# Patient Record
Sex: Female | Born: 1950
Health system: Southern US, Community
[De-identification: ages and names within clinical notes are randomized; demographics above are authoritative.]

## PROBLEM LIST (undated history)

## (undated) DIAGNOSIS — J449 Chronic obstructive pulmonary disease, unspecified: Secondary | ICD-10-CM

## (undated) DIAGNOSIS — H269 Unspecified cataract: Secondary | ICD-10-CM

## (undated) DIAGNOSIS — E785 Hyperlipidemia, unspecified: Secondary | ICD-10-CM

## (undated) DIAGNOSIS — J439 Emphysema, unspecified: Secondary | ICD-10-CM

## (undated) DIAGNOSIS — M1990 Unspecified osteoarthritis, unspecified site: Secondary | ICD-10-CM

## (undated) HISTORY — PX: ABDOMINAL HYSTERECTOMY: SHX81

## (undated) HISTORY — DX: Unspecified osteoarthritis, unspecified site: M19.90

## (undated) HISTORY — DX: Unspecified cataract: H26.9

## (undated) HISTORY — DX: Emphysema, unspecified: J43.9

## (undated) HISTORY — PX: TUBAL LIGATION: SHX77

## (undated) HISTORY — DX: Hyperlipidemia, unspecified: E78.5

---

## 2006-03-18 ENCOUNTER — Ambulatory Visit: Payer: Self-pay | Admitting: Internal Medicine

## 2012-09-23 ENCOUNTER — Inpatient Hospital Stay: Payer: Self-pay | Admitting: Psychiatry

## 2012-09-23 LAB — CBC
MCH: 33.1 pg (ref 26.0–34.0)
MCHC: 32.3 g/dL (ref 32.0–36.0)
MCV: 103 fL — ABNORMAL HIGH (ref 80–100)
Platelet: 239 10*3/uL (ref 150–440)
RDW: 13.2 % (ref 11.5–14.5)
WBC: 7.5 10*3/uL (ref 3.6–11.0)

## 2012-09-23 LAB — COMPREHENSIVE METABOLIC PANEL
Albumin: 3.5 g/dL (ref 3.4–5.0)
Anion Gap: 14 (ref 7–16)
Chloride: 107 mmol/L (ref 98–107)
Co2: 21 mmol/L (ref 21–32)
Creatinine: 0.86 mg/dL (ref 0.60–1.30)
EGFR (African American): >60
Glucose: 132 mg/dL — ABNORMAL HIGH (ref 65–99)
Osmolality: 283 (ref 275–301)
Potassium: 3.7 mmol/L (ref 3.5–5.1)
SGPT (ALT): 23 U/L (ref 12–78)
Sodium: 142 mmol/L (ref 136–145)

## 2012-09-23 LAB — DRUG SCREEN, URINE
Amphetamines, Ur Screen: NEGATIVE (ref ?–1000)
Cannabinoid 50 Ng, Ur ~~LOC~~: NEGATIVE (ref ?–50)
Cocaine Metabolite,Ur ~~LOC~~: NEGATIVE (ref ?–300)
MDMA (Ecstasy)Ur Screen: NEGATIVE (ref ?–500)
Methadone, Ur Screen: NEGATIVE (ref ?–300)
Opiate, Ur Screen: POSITIVE (ref ?–300)
Phencyclidine (PCP) Ur S: NEGATIVE (ref ?–25)

## 2012-09-23 LAB — URINALYSIS, COMPLETE
Bacteria: NONE SEEN
Bilirubin,UR: NEGATIVE
Glucose,UR: NEGATIVE mg/dL (ref 0–75)
Leukocyte Esterase: NEGATIVE
Nitrite: NEGATIVE
Ph: 6 (ref 4.5–8.0)
RBC,UR: <1 /HPF (ref 0–5)
Squamous Epithelial: <1

## 2012-09-23 LAB — ACETAMINOPHEN LEVEL
Acetaminophen: 50 ug/mL — ABNORMAL HIGH
Acetaminophen: 23 ug/mL

## 2012-09-23 LAB — TSH: Thyroid Stimulating Horm: 13.4 u[IU]/mL — ABNORMAL HIGH

## 2012-09-23 LAB — ETHANOL: Ethanol: 208 mg/dL

## 2012-09-23 LAB — SALICYLATE LEVEL: Salicylates, Serum: 2.7 mg/dL

## 2012-09-24 LAB — BEHAVIORAL MEDICINE 1 PANEL
Albumin: 3.5 g/dL (ref 3.4–5.0)
BUN: 6 mg/dL — ABNORMAL LOW (ref 7–18)
Bilirubin,Total: 0.6 mg/dL (ref 0.2–1.0)
Chloride: 107 mmol/L (ref 98–107)
Creatinine: 0.78 mg/dL (ref 0.60–1.30)
EGFR (African American): >60
Eosinophil #: 0.2 10*3/uL (ref 0.0–0.7)
Eosinophil %: 2.6 %
Glucose: 87 mg/dL (ref 65–99)
MCH: 34.1 pg — ABNORMAL HIGH (ref 26.0–34.0)
MCV: 102 fL — ABNORMAL HIGH (ref 80–100)
Monocyte #: 0.4 x10 3/mm (ref 0.2–0.9)
Neutrophil #: 3.1 10*3/uL (ref 1.4–6.5)
Neutrophil %: 51.5 %
Osmolality: 280 (ref 275–301)
Platelet: 243 10*3/uL (ref 150–440)
Potassium: 4 mmol/L (ref 3.5–5.1)
RBC: 4.14 10*6/uL (ref 3.80–5.20)
RDW: 13.4 % (ref 11.5–14.5)
SGOT(AST): 23 U/L (ref 15–37)
SGPT (ALT): 22 U/L (ref 12–78)
Sodium: 142 mmol/L (ref 136–145)
Total Protein: 6.4 g/dL (ref 6.4–8.2)
WBC: 5.9 10*3/uL (ref 3.6–11.0)

## 2012-09-24 LAB — URINALYSIS, COMPLETE
Glucose,UR: NEGATIVE mg/dL (ref 0–75)
Leukocyte Esterase: NEGATIVE
Nitrite: NEGATIVE
Ph: 5 (ref 4.5–8.0)
Protein: NEGATIVE
RBC,UR: 1 /HPF (ref 0–5)
WBC UR: 2 /HPF (ref 0–5)

## 2013-12-21 ENCOUNTER — Ambulatory Visit: Payer: Self-pay | Admitting: Internal Medicine

## 2014-09-29 ENCOUNTER — Emergency Department: Payer: Self-pay | Admitting: Emergency Medicine

## 2015-01-11 ENCOUNTER — Ambulatory Visit
Admit: 2015-01-11 | Disposition: A | Discharge status: Home / Self Care | Payer: Self-pay | Attending: Internal Medicine | Admitting: Internal Medicine

## 2015-01-17 NOTE — H&P (Signed)
DATE OF BIRTH:  November 04, 1950 SEX:  Female. RACE:  White. AGE:  64 years.  INITIAL ASSESSMENT AND PSYCHIATRIC EVALUATION  IDENTIFYING INFORMATION:  The patient is a 64 year old white female not employed, in fact retired after working for [employer] for many years. The patient is widowed for several years and lives in a trailer where her sister, who is 69 years old, and daughter, who is 20 years old, live with her. All three of them live in a trailer. The patient comes for first inpatient hospitalization in psychiatry at [facility] behavioral health with a chief complaint of: "On Christmas Eve, I drank a little bit too much, and I did not know what was going on in my mind. I took some hydrocodone pills, which I got them when I got my dental work done in January 06, 2010."   HISTORY OF PRESENT ILLNESS:  When the patient was asked when she last felt well, she reported recently that she has been overwhelmed, and she has to be a caregiver for many people. In fact, she had to cook Christmas dinner all by herself and had to cook even for her mother-in-law. She started thinking of her husband, who died in Jan 07, 1995, and had arguments with her daughter, and her feelings got hurt and, she did not know what to do and so she took some extra pills of hydrocodone, which she saved the bottle, and in addition to alcohol and told her daughter, who called the ambulance and brought her here.   PAST PSYCHIATRIC HISTORY:  No previous history of inpatient hospitalization on psychiatry. No history of suicide attempts. Not being followed by any psychiatrist at this time.  FAMILY HISTORY OF MENTAL ILLNESS:  No known mental illness. No known suicides in the family.  FAMILY HISTORY:  Raised by parents. Father worked for a Personal [employer]. Father is still living, 32 years old. Mother worked in a [employer]. Mother died of Alzheimer's disease in her 31s. Has 2 sisters. No brothers. Close to family. In fact, calls herself a caregiver for  everybody.  PERSONAL HISTORY:  Born in [place], [place]. Graduate of high school. No college.  WORK HISTORY:  Longest job she has ever had was working for [employer]. Has not worked in many years, and in fact she quit when her second husband wanted her to stay home and take care of the childrenand  take care of everything else.   MARRIAGES:  Married twice. First marriage ended because he would not work and he was lazy. Has 2 children from first marriage, 40 and 27. She is in touch with them. Second marriage lasted until he died, and has 2 children, 51 and 59 years old, and is in touch with them.  ALCOHOL AND DRUGS:  First drink of alcohol about 20 years. No problems with alcohol drinking. No history of DWIs. Never arrested for public drunkenness. Was upset on Christmas Eve, and so she took a little bit too much alcohol, realizes the same. Denies street drug abuse. Denies using IV drugs. Smokes cigarettes at the rate of less than 1 pack a day for many year.  MILITARY HISTORY:  None.  MEDICAL HISTORY:  No known history of high blood pressure. No known diabetes of mellitus. No major surgeries. No major injuries. No history of motor vehicle accident. Never been unconscious. No known drug allergies. Not being followed by any physician at this time because she has no insurance.  PHYSICAL EXAMINATION: VITAL SIGNS:  Temperature is 98.2, pulse is 86 per  minute and regular, respirations 20 per minute and regular, blood pressure is 128/70 mmHg. HEENT:  Head is normocephalic, atraumatic. Eyes PERRLA. Fundi bilaterally benign. EOMs visualized. Tympanic membranes visualized, no exudates.  NECK:  Supple without any organomegaly, lymphadenopathy, thyromegaly.  CHEST:  Normal expansion. Normal breath sounds.  HEART:  Normal S1, S2 without any murmurs or gallops.  ABDOMEN:  Soft. No organomegaly. Bowel sounds heard.  RECTAL:  Deferred. NEUROLOGICAL:  Gait is normal. Romberg is negative. Cranial  nerves II through XII are grossly intact. DTRs 2+. Plantars are normal response.   MENTAL STATUS EXAMINATION:  The patient is dressed in personal clothes, alert and oriented to place, person and time. Fully aware of situation that brought her in for admission to [facility]. Affect is appropriate. Mood is stable, and admits that she is not depressed right now. Does feel stress out because she has been caregiver and having to do everything for everybody around and starting laughing while talking about it. No evidence of psychosis. Denies auditory or visual hallucinations. Denies hearing voices, seeing things. Memory is intact. Cognition is intact.  General knowledge and information is fair. Recall and memory are good. Could do serial 7s. Could spell the word "world" forward and backward without any problems. Appetite is a little slow because food is not that great here. Sleep is okay because of meds.. Insight and judgment fair.   IMPRESSION: AXIS I: 1.  Impulse control disorder. 2.  Depression secondary to stressors of life. 3.  Nicotine dependence - abuse. AXIS II:  Deferred. AXIS III:  None immediate. AXIS IV:  Moderate. Has too many family issues, and being a caregiver for too many people added lots of stress in her. AXIS V:  Global assessment of functioning 30.  PLAN:  The patient will be admitted to [facility] behavioral health for close observation, evaluation and help. She will be started on a low dose of antidepressant as needed. During the stay in the hospital, she will be given milieu therapy and supportive counseling where coping skills and dealing with stressors of life and impulse control will be addressed. At the time of discharge, the patient will have better insight into her problems and will not be having any ideas to hurt herself further. Appropriate followup appointment will be given in the community for counseling as needed.     ____________________________ Wallace Cullens. Franchot Mimes,  MD skc:ms D: 09/24/2012 19:55:54 ET T: 09/24/2012 21:21:19 ET JOB#: [id number]  cc: Arlyn Leak K. Franchot Mimes, MD, <Dictator> Dewain Penning MD ELECTRONICALLY SIGNED 09/26/2012 18:26

## 2015-01-20 ENCOUNTER — Emergency Department: Admit: 2015-01-20 | Disposition: A | Discharge status: Home / Self Care | Payer: Self-pay | Admitting: Student

## 2015-03-02 NOTE — Discharge Instructions (Signed)

## 2015-03-03 ENCOUNTER — Other Ambulatory Visit: Payer: Self-pay | Admitting: Gastroenterology

## 2015-03-03 ENCOUNTER — Ambulatory Visit
Admission: RE | Admit: 2015-03-03 | Discharge: 2015-03-03 | Disposition: A | Discharge status: Home / Self Care | Payer: BLUE CROSS/BLUE SHIELD | Source: Ambulatory Visit | Attending: Gastroenterology | Admitting: Gastroenterology

## 2015-03-03 ENCOUNTER — Ambulatory Visit: Payer: BLUE CROSS/BLUE SHIELD | Admitting: Anesthesiology

## 2015-03-03 ENCOUNTER — Encounter
Admission: RE | Disposition: A | Discharge status: Home / Self Care | Payer: Self-pay | Source: Ambulatory Visit | Attending: Gastroenterology

## 2015-03-03 DIAGNOSIS — K640 First degree hemorrhoids: Secondary | ICD-10-CM | POA: Insufficient documentation

## 2015-03-03 DIAGNOSIS — Z8489 Family history of other specified conditions: Secondary | ICD-10-CM | POA: Diagnosis not present

## 2015-03-03 DIAGNOSIS — K621 Rectal polyp: Secondary | ICD-10-CM | POA: Insufficient documentation

## 2015-03-03 DIAGNOSIS — Z90710 Acquired absence of both cervix and uterus: Secondary | ICD-10-CM | POA: Diagnosis not present

## 2015-03-03 DIAGNOSIS — Z8249 Family history of ischemic heart disease and other diseases of the circulatory system: Secondary | ICD-10-CM | POA: Insufficient documentation

## 2015-03-03 DIAGNOSIS — Z1211 Encounter for screening for malignant neoplasm of colon: Secondary | ICD-10-CM | POA: Insufficient documentation

## 2015-03-03 DIAGNOSIS — Z87891 Personal history of nicotine dependence: Secondary | ICD-10-CM | POA: Diagnosis not present

## 2015-03-03 DIAGNOSIS — M1990 Unspecified osteoarthritis, unspecified site: Secondary | ICD-10-CM | POA: Insufficient documentation

## 2015-03-03 HISTORY — PX: POLYPECTOMY: SHX149

## 2015-03-03 HISTORY — PX: COLONOSCOPY: SHX5424

## 2015-03-03 LAB — HM COLONOSCOPY

## 2015-03-03 SURGERY — COLONOSCOPY
Anesthesia: Monitor Anesthesia Care

## 2015-03-03 MED ORDER — LACTATED RINGERS IV SOLN
INTRAVENOUS | Status: DC
  Administered 2015-03-03 (×2): via INTRAVENOUS

## 2015-03-03 MED ORDER — STERILE WATER FOR IRRIGATION IR SOLN
Status: DC | PRN
  Administered 2015-03-03: 09:00:00

## 2015-03-03 MED ORDER — ACETAMINOPHEN 160 MG/5ML PO SOLN: 325.0000 mg | ORAL | Status: DC | PRN

## 2015-03-03 MED ORDER — LIDOCAINE HCL (CARDIAC) 20 MG/ML IV SOLN
INTRAVENOUS | Status: DC | PRN
  Administered 2015-03-03: 30 mg via INTRAVENOUS

## 2015-03-03 MED ORDER — PROPOFOL 10 MG/ML IV BOLUS
INTRAVENOUS | Status: DC | PRN
  Administered 2015-03-03 (×7): 20 mg via INTRAVENOUS

## 2015-03-03 MED ORDER — ACETAMINOPHEN 325 MG PO TABS
325.0000 mg | ORAL_TABLET | ORAL | Status: DC | PRN
Start: 2015-03-03 — End: 2015-03-03

## 2015-03-03 SURGICAL SUPPLY — 28 items
CANISTER SUCT 1200ML W/VALVE (MISCELLANEOUS) ×3 IMPLANT
FCP ESCP3.2XJMB 240X2.8X (MISCELLANEOUS)
FORCEPS BIOP RAD 4 LRG CAP 4 (CUTTING FORCEPS) IMPLANT
FORCEPS BIOP RJ4 240 W/NDL (MISCELLANEOUS)
FORCEPS ESCP3.2XJMB 240X2.8X (MISCELLANEOUS) IMPLANT
GOWN CVR UNV OPN BCK APRN NK (MISCELLANEOUS) ×4 IMPLANT
GOWN ISOL THUMB LOOP REG UNIV (MISCELLANEOUS) ×2
HEMOCLIP INSTINCT (CLIP) IMPLANT
INJECTOR VARIJECT VIN23 (MISCELLANEOUS) IMPLANT
KIT CO2 TUBING (TUBING) IMPLANT
KIT DEFENDO VALVE AND CONN (KITS) IMPLANT
KIT ENDO PROCEDURE OLY (KITS) ×3 IMPLANT
LIGATOR MULTIBAND 6SHOOTER MBL (MISCELLANEOUS) IMPLANT
MARKER SPOT ENDO TATTOO 5ML (MISCELLANEOUS) IMPLANT
PAD GROUND ADULT SPLIT (MISCELLANEOUS) IMPLANT
SNARE SHORT THROW 13M SML OVAL (MISCELLANEOUS) ×3 IMPLANT
SNARE SHORT THROW 30M LRG OVAL (MISCELLANEOUS) IMPLANT
SPOT EX ENDOSCOPIC TATTOO (MISCELLANEOUS)
SUCTION POLY TRAP 4CHAMBER (MISCELLANEOUS) IMPLANT
TRAP SUCTION POLY (MISCELLANEOUS) ×3 IMPLANT
TUBING CONN 6MMX3.1M (TUBING)
TUBING SUCTION CONN 0.25 STRL (TUBING) IMPLANT
UNDERPAD 30X60 958B10 (PK) (MISCELLANEOUS) IMPLANT
VALVE BIOPSY ENDO (VALVE) IMPLANT
VARIJECT INJECTOR VIN23 (MISCELLANEOUS)
WATER AUXILLARY (MISCELLANEOUS) IMPLANT
WATER STERILE IRR 250ML POUR (IV SOLUTION) ×3 IMPLANT
WATER STERILE IRR 500ML POUR (IV SOLUTION) IMPLANT

## 2015-03-03 NOTE — Transfer of Care (Signed)
Immediate Anesthesia Transfer of Care Note  Patient: Eileen Mills  Procedure(s) Performed: Procedure(s): COLONOSCOPY (N/A) POLYPECTOMY INTESTINAL  Patient Location: PACU  Anesthesia Type: MAC  Level of Consciousness: awake, alert  and patient cooperative  Airway and Oxygen Therapy: Patient Spontanous Breathing and Patient connected to supplemental oxygen  Post-op Assessment: Post-op Vital signs reviewed, Patient's Cardiovascular Status Stable, Respiratory Function Stable, Patent Airway and No signs of Nausea or vomiting  Post-op Vital Signs: Reviewed and stable  Complications: No apparent anesthesia complications

## 2015-03-03 NOTE — H&P (Signed)
  [facility]  [address], [place], [place] [address] Phone: [phone] Fax : [phone]  Primary Care Physician:  Halina Maidens, MD Primary Gastroenterologist:  Dr. Allen Norris  Pre-Procedure History & Physical: HPI:  Eileen Mills is a 64 y.o. female is here for a screening colonoscopy.   History reviewed. No pertinent past medical history.  Past Surgical History  Procedure Laterality Date  . Abdominal hysterectomy      Prior to Admission medications   Not on File    Allergies as of 02/17/2015  . (Not on File)    History reviewed. No pertinent family history.  History   Social History  . Marital Status: Widowed    Spouse Name: N/A  . Number of Children: N/A  . Years of Education: N/A   Occupational History  . Not on file.   Social History Main Topics  . Smoking status: Former Research scientist (life sciences)  . Smokeless tobacco: Not on file  . Alcohol Use: No  . Drug Use: Not on file  . Sexual Activity: Not on file   Other Topics Concern  . Not on file   Social History Narrative    Review of Systems: See HPI, otherwise negative ROS  Physical Exam: BP 106/74 mmHg  Pulse 73  Temp(Src) 97.9 F (36.6 C) (Tympanic)  Ht 5\' 3"  (1.6 m)  Wt 143 lb (64.864 kg)  BMI 25.34 kg/m2  SpO2 96% General:   Alert,  pleasant and cooperative in NAD Head:  Normocephalic and atraumatic. Neck:  Supple; no masses or thyromegaly. Lungs:  Clear throughout to auscultation.    Heart:  Regular rate and rhythm. Abdomen:  Soft, nontender and nondistended. Normal bowel sounds, without guarding, and without rebound.   Neurologic:  Alert and  oriented x4;  grossly normal neurologically.  Impression/Plan: Eileen Mills is now here to undergo a screening colonoscopy.  Risks, benefits, and alternatives regarding colonoscopy have been reviewed with the patient.  Questions have been answered.  All parties agreeable.

## 2015-03-03 NOTE — Op Note (Signed)
[facility] Patient Name: Eileen Mills Procedure Date: 03/03/2015 8:31 AM MRN: [id number] Account #: [id number] Date of Birth: [birth year] Admit Type: Outpatient Age: 64 Room: [facility] OR ROOM 01 Gender: Female Note Status: Finalized Procedure:         Colonoscopy Indications:       Screening for colorectal malignant neoplasm Providers:         Lucilla Lame, MD Referring MD:      Halina Maidens, MD (Referring MD) Medicines:         Propofol per Anesthesia Complications:     No immediate complications. Procedure:         Pre-Anesthesia Assessment:                    - Prior to the procedure, a History and Physical was                     performed, and patient medications and allergies were                     reviewed. The patient's tolerance of previous anesthesia                     was also reviewed. The risks and benefits of the procedure                     and the sedation options and risks were discussed with the                     patient. All questions were answered, and informed consent                     was obtained. Prior Anticoagulants: The patient has taken                     no previous anticoagulant or antiplatelet agents. ASA                     Grade Assessment: II - A patient with mild systemic                     disease. After reviewing the risks and benefits, the                     patient was deemed in satisfactory condition to undergo                     the procedure.                    After obtaining informed consent, the colonoscope was                     passed under direct vision. Throughout the procedure, the                     patient's blood pressure, pulse, and oxygen saturations                     were monitored continuously. The Olympus CF H180AL                     colonoscope (S#: [id number]) was introduced through the anus  and advanced to the the cecum, identified by appendiceal               orifice and ileocecal valve. The colonoscopy was performed                     without difficulty. The patient tolerated the procedure                     well. The quality of the bowel preparation was excellent. Findings:      The perianal and digital rectal examinations were normal.      A 4 mm polyp was found in the rectum. The polyp was sessile. The polyp       was removed with a cold snare. Resection and retrieval were complete.      Non-bleeding internal hemorrhoids were found during retroflexion. The       hemorrhoids were Grade I (internal hemorrhoids that do not prolapse). Impression:        - One 4 mm polyp in the rectum. Resected and retrieved.                    - Non-bleeding internal hemorrhoids. Recommendation:    - Await pathology results.                    - Repeat colonoscopy in 5 years if polyp adenoma and 10                     years if hyperplastic Procedure Code(s): --- Professional ---                    [phone], Colonoscopy, flexible; with removal of tumor(s),                     polyp(s), or other lesion(s) by snare technique Diagnosis Code(s): --- Professional ---                    Z12.11, Encounter for screening for malignant neoplasm of                     colon                    K62.1, Rectal polyp CPT copyright 2014 American Medical Association. All rights reserved. The codes documented in this report are preliminary and upon coder review may  be revised to meet current compliance requirements. Lucilla Lame, MD 03/03/2015 8:49:48 AM This report has been signed electronically. Number of Addenda: 0 Note Initiated On: 03/03/2015 8:31 AM Scope Withdrawal Time: 0 hours 8 minutes 38 seconds  Total Procedure Duration: 0 hours 12 minutes 1 second       [facility]

## 2015-03-03 NOTE — Anesthesia Preprocedure Evaluation (Signed)
Anesthesia Evaluation  Patient identified by MRN, date of birth, ID band  Reviewed: Allergy & Precautions, H&P , NPO status , Patient's Chart, lab work & pertinent test results  Airway Mallampati: I  TM Distance: >3 FB Neck ROM: full    Dental no notable dental hx.    Pulmonary former smoker,    Pulmonary exam normal       Cardiovascular Rhythm:regular Rate:Normal     Neuro/Psych    GI/Hepatic   Endo/Other    Renal/GU      Musculoskeletal   Abdominal   Peds  Hematology   Anesthesia Other Findings   Reproductive/Obstetrics                             Anesthesia Physical Anesthesia Plan  ASA: I  Anesthesia Plan: MAC   Post-op Pain Management:    Induction:   Airway Management Planned:   Additional Equipment:   Intra-op Plan:   Post-operative Plan:   Informed Consent: I have reviewed the patients History and Physical, chart, labs and discussed the procedure including the risks, benefits and alternatives for the proposed anesthesia with the patient or authorized representative who has indicated his/her understanding and acceptance.     Plan Discussed with: CRNA  Anesthesia Plan Comments:         Anesthesia Quick Evaluation

## 2015-03-03 NOTE — Anesthesia Postprocedure Evaluation (Signed)
  Anesthesia Post-op Note  Patient: Eileen Mills  Procedure(s) Performed: Procedure(s): COLONOSCOPY (N/A) POLYPECTOMY INTESTINAL  Anesthesia type:MAC  Patient location: PACU  Post pain: Pain level controlled  Post assessment: Post-op Vital signs reviewed, Patient's Cardiovascular Status Stable, Respiratory Function Stable, Patent Airway and No signs of Nausea or vomiting  Post vital signs: Reviewed and stable  Last Vitals:  Filed Vitals:   03/03/15 0900  BP: 95/65  Pulse: 66  Temp:   Resp: 18    Level of consciousness: awake, alert  and patient cooperative  Complications: No apparent anesthesia complications

## 2015-03-06 ENCOUNTER — Encounter: Payer: Self-pay | Admitting: Gastroenterology

## 2015-03-13 ENCOUNTER — Encounter: Payer: Self-pay | Admitting: Gastroenterology

## 2015-07-02 ENCOUNTER — Encounter: Payer: Self-pay | Admitting: Internal Medicine

## 2015-07-02 DIAGNOSIS — M4710 Other spondylosis with myelopathy, site unspecified: Secondary | ICD-10-CM | POA: Insufficient documentation

## 2015-07-02 DIAGNOSIS — M7120 Synovial cyst of popliteal space [Baker], unspecified knee: Secondary | ICD-10-CM | POA: Insufficient documentation

## 2015-07-02 DIAGNOSIS — R002 Palpitations: Secondary | ICD-10-CM | POA: Insufficient documentation

## 2015-07-02 DIAGNOSIS — M67439 Ganglion, unspecified wrist: Secondary | ICD-10-CM | POA: Insufficient documentation

## 2015-07-02 DIAGNOSIS — R140 Abdominal distension (gaseous): Secondary | ICD-10-CM | POA: Insufficient documentation

## 2015-07-02 DIAGNOSIS — E785 Hyperlipidemia, unspecified: Secondary | ICD-10-CM | POA: Insufficient documentation

## 2015-07-07 ENCOUNTER — Ambulatory Visit (INDEPENDENT_AMBULATORY_CARE_PROVIDER_SITE_OTHER): Payer: BLUE CROSS/BLUE SHIELD

## 2015-07-07 DIAGNOSIS — Z23 Encounter for immunization: Secondary | ICD-10-CM

## 2015-10-13 ENCOUNTER — Ambulatory Visit (INDEPENDENT_AMBULATORY_CARE_PROVIDER_SITE_OTHER): Payer: BLUE CROSS/BLUE SHIELD | Admitting: Internal Medicine

## 2015-10-13 ENCOUNTER — Encounter: Payer: Self-pay | Admitting: Internal Medicine

## 2015-10-13 VITALS — BP 126/82 | HR 68 | Temp 97.8°F | Ht 63.0 in | Wt 146.4 lb

## 2015-10-13 DIAGNOSIS — H6983 Other specified disorders of Eustachian tube, bilateral: Secondary | ICD-10-CM

## 2015-10-13 DIAGNOSIS — R0989 Other specified symptoms and signs involving the circulatory and respiratory systems: Secondary | ICD-10-CM

## 2015-10-13 NOTE — Patient Instructions (Signed)

## 2015-10-13 NOTE — Progress Notes (Signed)
Date:  10/13/2015   Name:  Eileen Mills   DOB:  January 11, 1951   MRN:  [id number]   Chief Complaint: Ear Problem Four days ago started to notice fullness in both ears. She denies recent sinus infection or upper respiratory infection. She denies pain or drainage. She feels like her hearing is intensified and muffled at the same time. She took one dose of Sudafed yesterday without improvement.  Abdominal pulsations - patient has noticed pulsations in her left mid abdomen. These occur several times a day and usually associated with being anxious or stressed. Does not change with food or other activity. She has a long history of smoking quit 7 years ago. Her father had an abdominal aortic aneurysm.  Review of Systems  Constitutional: Negative for fever and chills.  HENT: Negative for ear pain, hearing loss and sinus pressure.   Eyes: Negative for visual disturbance.  Respiratory: Negative for chest tightness and shortness of breath.   Cardiovascular: Negative for chest pain, palpitations and leg swelling.  Gastrointestinal: Negative for diarrhea, constipation and abdominal distention.    Patient Active Problem List   Diagnosis Date Noted  . Ganglion cyst of wrist 07/02/2015  . Intermittent palpitations 07/02/2015  . Degenerative arthritis of spine with cord compression 07/02/2015  . Abdominal bloating 07/02/2015  . Baker's cyst of knee 07/02/2015  . Hyperlipidemia, mild 07/02/2015    Prior to Admission medications   Medication Sig Start Date End Date Taking? Authorizing Provider  acetaminophen (TYLENOL) 500 MG tablet Take 1 tablet by mouth as needed.   Yes Historical Provider, MD    Allergies  Allergen Reactions  . Codeine Sulfate Other (See Comments)    "Sickness."  . Sulfa Antibiotics Other (See Comments)    It was a long time ago, cannot remember reaction.    Past Surgical History  Procedure Laterality Date  . Abdominal hysterectomy    . Colonoscopy N/A 03/03/2015      Procedure: COLONOSCOPY;  Surgeon: Lucilla Lame, MD;  Location: [facility];  Service: Gastroenterology;  Laterality: N/A;  . Polypectomy  03/03/2015    Procedure: POLYPECTOMY INTESTINAL;  Surgeon: Lucilla Lame, MD;  Location: [facility];  Service: Gastroenterology;;    Social History  Substance Use Topics  . Smoking status: Former Research scientist (life sciences)  . Smokeless tobacco: None  . Alcohol Use: No     Medication list has been reviewed and updated.   Physical Exam  Constitutional: She is oriented to person, place, and time. She appears well-developed and well-nourished. No distress.  HENT:  Head: Normocephalic and atraumatic.  Right Ear: Tympanic membrane and ear canal normal.  Left Ear: Tympanic membrane and ear canal normal.  Nose: Right sinus exhibits no maxillary sinus tenderness. Left sinus exhibits no maxillary sinus tenderness.  Mouth/Throat: Uvula is midline and oropharynx is clear and moist.  Eyes: EOM are normal.  Neck: Normal range of motion. Carotid bruit is not present. No erythema present. No thyromegaly present.  Cardiovascular: Normal rate, regular rhythm, normal heart sounds and normal pulses.   Pulmonary/Chest: Effort normal and breath sounds normal. No respiratory distress. She has no wheezes. Right breast exhibits no mass, no nipple discharge, no skin change and no tenderness. Left breast exhibits no mass, no nipple discharge, no skin change and no tenderness.  Abdominal: Soft. Normal appearance and bowel sounds are normal. She exhibits no pulsatile liver, no abdominal bruit and no mass. There is no hepatosplenomegaly. There is tenderness in the left upper quadrant and  left lower quadrant. There is no CVA tenderness.  Musculoskeletal: Normal range of motion.  Lymphadenopathy:    She has no cervical adenopathy.    She has no axillary adenopathy.  Neurological: She is alert and oriented to person, place, and time. She has normal reflexes. No cranial nerve deficit  or sensory deficit.  Skin: Skin is warm, dry and intact. No rash noted.  Psychiatric: She has a normal mood and affect. Her speech is normal and behavior is normal. Thought content normal.  Nursing note and vitals reviewed.   BP 126/82 mmHg  Pulse 68  Temp(Src) 97.8 F (36.6 C) (Oral)  Ht 5\' 3"  (1.6 m)  Wt 146 lb 6.4 oz (66.407 kg)  BMI 25.94 kg/m2  SpO2 98%  Assessment and Plan: 1. Eustachian tube dysfunction, bilateral Continue sudafed every 4 hours Use Afrin nasal spray  2. Prominent abdominal aortic pulsation - US Aorta; Future   Halina Maidens, MD [facility]  10/13/2015

## 2015-10-17 ENCOUNTER — Telehealth: Payer: Self-pay

## 2015-10-17 ENCOUNTER — Ambulatory Visit
Admission: RE | Admit: 2015-10-17 | Discharge: 2015-10-17 | Disposition: A | Discharge status: Home / Self Care | Payer: BLUE CROSS/BLUE SHIELD | Source: Ambulatory Visit | Attending: Internal Medicine | Admitting: Internal Medicine

## 2015-10-17 DIAGNOSIS — R0989 Other specified symptoms and signs involving the circulatory and respiratory systems: Secondary | ICD-10-CM | POA: Diagnosis not present

## 2015-10-17 NOTE — Telephone Encounter (Signed)
-----   Message from Glean Hess, MD sent at 10/17/2015 10:12 AM EST ----- No aneurysm seen.

## 2015-10-17 NOTE — Telephone Encounter (Signed)
Spoke with patient. Patient advised of all results and verbalized understanding. Will call back with any future questions or concerns. MA[facility]  

## 2016-01-05 ENCOUNTER — Ambulatory Visit (INDEPENDENT_AMBULATORY_CARE_PROVIDER_SITE_OTHER): Payer: BLUE CROSS/BLUE SHIELD | Admitting: Internal Medicine

## 2016-01-05 ENCOUNTER — Encounter: Payer: Self-pay | Admitting: Internal Medicine

## 2016-01-05 ENCOUNTER — Ambulatory Visit
Admission: RE | Admit: 2016-01-05 | Discharge: 2016-01-05 | Disposition: A | Discharge status: Home / Self Care | Payer: BLUE CROSS/BLUE SHIELD | Source: Ambulatory Visit | Attending: Internal Medicine | Admitting: Internal Medicine

## 2016-01-05 ENCOUNTER — Telehealth: Payer: Self-pay

## 2016-01-05 VITALS — BP 120/68 | HR 76 | Temp 98.1°F | Ht 63.0 in | Wt 146.6 lb

## 2016-01-05 DIAGNOSIS — Z0000 Encounter for general adult medical examination without abnormal findings: Secondary | ICD-10-CM | POA: Diagnosis not present

## 2016-01-05 DIAGNOSIS — J40 Bronchitis, not specified as acute or chronic: Secondary | ICD-10-CM

## 2016-01-05 DIAGNOSIS — Z1159 Encounter for screening for other viral diseases: Secondary | ICD-10-CM

## 2016-01-05 DIAGNOSIS — E785 Hyperlipidemia, unspecified: Secondary | ICD-10-CM | POA: Diagnosis not present

## 2016-01-05 DIAGNOSIS — Z1239 Encounter for other screening for malignant neoplasm of breast: Secondary | ICD-10-CM

## 2016-01-05 DIAGNOSIS — R002 Palpitations: Secondary | ICD-10-CM

## 2016-01-05 DIAGNOSIS — Z23 Encounter for immunization: Secondary | ICD-10-CM | POA: Diagnosis not present

## 2016-01-05 LAB — POCT URINALYSIS DIPSTICK
Bilirubin, UA: NEGATIVE
Blood, UA: NEGATIVE
Glucose, UA: NEGATIVE
KETONES UA: NEGATIVE
Leukocytes, UA: NEGATIVE
Nitrite, UA: NEGATIVE
PH UA: 7
PROTEIN UA: NEGATIVE
Spec Grav, UA: 1.015
Urobilinogen, UA: 0.2

## 2016-01-05 MED ORDER — AZITHROMYCIN 250 MG PO TABS: ORAL_TABLET | ORAL | Status: DC

## 2016-01-05 NOTE — Telephone Encounter (Signed)
-----   Message from Glean Hess, MD sent at 01/05/2016  1:36 PM EDT ----- Chest Xray is normal.  Take antibiotics as prescribed.

## 2016-01-05 NOTE — Telephone Encounter (Signed)
Spoke with patient. Patient advised of all results and verbalized understanding. Will call back with any future questions or concerns. MA[facility]  

## 2016-01-05 NOTE — Patient Instructions (Signed)
Tdap Vaccine (Tetanus, Diphtheria and Pertussis): What You Need to Know 1. Why get vaccinated? Tetanus, diphtheria and pertussis are very serious diseases. Tdap vaccine can protect us from these diseases. And, Tdap vaccine given to pregnant women can protect newborn babies against pertussis. TETANUS (Lockjaw) is rare in the [place] today. It causes painful muscle tightening and stiffness, usually all over the body.  It can lead to tightening of muscles in the head and neck so you can't open your mouth, swallow, or sometimes even breathe. Tetanus kills about 1 out of 10 people who are infected even after receiving the best medical care. DIPHTHERIA is also rare in the [place] today. It can cause a thick coating to form in the back of the throat.  It can lead to breathing problems, heart failure, paralysis, and death. PERTUSSIS (Whooping Cough) causes severe coughing spells, which can cause difficulty breathing, vomiting and disturbed sleep.  It can also lead to weight loss, incontinence, and rib fractures. Up to 2 in 100 adolescents and 5 in 100 adults with pertussis are hospitalized or have complications, which could include pneumonia or death. These diseases are caused by bacteria. Diphtheria and pertussis are spread from person to person through secretions from coughing or sneezing. Tetanus enters the body through cuts, scratches, or wounds. Before vaccines, as many as 200,000 cases of diphtheria, 200,000 cases of pertussis, and hundreds of cases of tetanus, were reported in the [place] each year. Since vaccination began, reports of cases for tetanus and diphtheria have dropped by about 99% and for pertussis by about 80%. 2. Tdap vaccine Tdap vaccine can protect adolescents and adults from tetanus, diphtheria, and pertussis. One dose of Tdap is routinely given at age 11 or 12. People who did not get Tdap at that age should get it as soon as possible. Tdap is especially important  for healthcare professionals and anyone having close contact with a baby younger than 12 months. Pregnant women should get a dose of Tdap during every pregnancy, to protect the newborn from pertussis. Infants are most at risk for severe, life-threatening complications from pertussis. Another vaccine, called Td, protects against tetanus and diphtheria, but not pertussis. A Td booster should be given every 10 years. Tdap may be given as one of these boosters if you have never gotten Tdap before. Tdap may also be given after a severe cut or burn to prevent tetanus infection. Your doctor or the person giving you the vaccine can give you more information. Tdap may safely be given at the same time as other vaccines. 3. Some people should not get this vaccine  A person who has ever had a life-threatening allergic reaction after a previous dose of any diphtheria, tetanus or pertussis containing vaccine, OR has a severe allergy to any part of this vaccine, should not get Tdap vaccine. Tell the person giving the vaccine about any severe allergies.  Anyone who had coma or long repeated seizures within 7 days after a childhood dose of DTP or DTaP, or a previous dose of Tdap, should not get Tdap, unless a cause other than the vaccine was found. They can still get Td.  Talk to your doctor if you:  have seizures or another nervous system problem,  had severe pain or swelling after any vaccine containing diphtheria, tetanus or pertussis,  ever had a condition called Guillain-Barr Syndrome (GBS),  aren't feeling well on the day the shot is scheduled. 4. Risks With any medicine, including vaccines, there is   a chance of side effects. These are usually mild and go away on their own. Serious reactions are also possible but are rare. Most people who get Tdap vaccine do not have any problems with it. Mild problems following Tdap (Did not interfere with activities)  Pain where the shot was given (about 3 in 4  adolescents or 2 in 3 adults)  Redness or swelling where the shot was given (about 1 person in 5)  Mild fever of at least 100.4F (up to about 1 in 25 adolescents or 1 in 100 adults)  Headache (about 3 or 4 people in 10)  Tiredness (about 1 person in 3 or 4)  Nausea, vomiting, diarrhea, stomach ache (up to 1 in 4 adolescents or 1 in 10 adults)  Chills, sore joints (about 1 person in 10)  Body aches (about 1 person in 3 or 4)  Rash, swollen glands (uncommon) Moderate problems following Tdap (Interfered with activities, but did not require medical attention)  Pain where the shot was given (up to 1 in 5 or 6)  Redness or swelling where the shot was given (up to about 1 in 16 adolescents or 1 in 12 adults)  Fever over 102F (about 1 in 100 adolescents or 1 in 250 adults)  Headache (about 1 in 7 adolescents or 1 in 10 adults)  Nausea, vomiting, diarrhea, stomach ache (up to 1 or 3 people in 100)  Swelling of the entire arm where the shot was given (up to about 1 in 500). Severe problems following Tdap (Unable to perform usual activities; required medical attention)  Swelling, severe pain, bleeding and redness in the arm where the shot was given (rare). Problems that could happen after any vaccine:  People sometimes faint after a medical procedure, including vaccination. Sitting or lying down for about 15 minutes can help prevent fainting, and injuries caused by a fall. Tell your doctor if you feel dizzy, or have vision changes or ringing in the ears.  Some people get severe pain in the shoulder and have difficulty moving the arm where a shot was given. This happens very rarely.  Any medication can cause a severe allergic reaction. Such reactions from a vaccine are very rare, estimated at fewer than 1 in a million doses, and would happen within a few minutes to a few hours after the vaccination. As with any medicine, there is a very remote chance of a vaccine causing a serious  injury or death. The safety of vaccines is always being monitored. For more information, visit: www.cdc.gov/vaccinesafety/ 5. What if there is a serious problem? What should I look for?  Look for anything that concerns you, such as signs of a severe allergic reaction, very high fever, or unusual behavior.  Signs of a severe allergic reaction can include hives, swelling of the face and throat, difficulty breathing, a fast heartbeat, dizziness, and weakness. These would usually start a few minutes to a few hours after the vaccination. What should I do?  If you think it is a severe allergic reaction or other emergency that can't wait, call 9-1-1 or get the person to the nearest hospital. Otherwise, call your doctor.  Afterward, the reaction should be reported to the Vaccine Adverse Event Reporting System (VAERS). Your doctor might file this report, or you can do it yourself through the VAERS web site at www.vaers.hhs.gov, or by calling 1-800-822-7967. VAERS does not give medical advice.  6. The National Vaccine Injury Compensation Program The National Vaccine Injury Compensation Program (  VICP) is a federal program that was created to compensate people who may have been injured by certain vaccines. Persons who believe they may have been injured by a vaccine can learn about the program and about filing a claim by calling 1-800-338-2382 or visiting the VICP website at www.hrsa.gov/vaccinecompensation. There is a time limit to file a claim for compensation. 7. How can I learn more?  Ask your doctor. He or she can give you the vaccine package insert or suggest other sources of information.  Call your local or state health department.  Contact the Centers for Disease Control and Prevention (CDC):  Call 1-800-232-4636 (1-800-CDC-INFO) or  Visit CDC's website at www.cdc.gov/vaccines CDC Tdap Vaccine VIS (11/23/13)   This information is not intended to replace advice given to you by your health care  provider. Make sure you discuss any questions you have with your health care provider.   Document Released: 03/17/2012 Document Revised: 10/07/2014 Document Reviewed: 12/29/2013 Elsevier Interactive Patient Education 2016 Elsevier Inc.  

## 2016-01-05 NOTE — Progress Notes (Signed)
Date:  01/05/2016   Name:  Eileen Mills   DOB:  03-Aug-1951   MRN:  [id number]   Chief Complaint: Annual Exam Eileen Mills is a 65 y.o. female who presents today for her Complete Annual Exam. She feels fairly well. She reports exercising with yard work and child care. She reports she is sleeping well.  She is due for mammogram.  She had a total hysterectomy.  Cough This is a new problem. The current episode started in the past 7 days. The problem has been unchanged. The problem occurs hourly. The cough is productive of sputum. Associated symptoms include myalgias (in right leg - only at night in calf) and wheezing. Pertinent negatives include no chest pain, chills, fever, headaches or rash. Her past medical history is significant for environmental allergies.    Review of Systems  Constitutional: Positive for fatigue. Negative for fever and chills.  HENT: Negative for hearing loss, sinus pressure, sneezing, tinnitus and trouble swallowing.   Eyes: Negative for visual disturbance.  Respiratory: Positive for cough and wheezing. Negative for choking and chest tightness.   Cardiovascular: Positive for palpitations. Negative for chest pain and leg swelling.  Gastrointestinal: Negative for abdominal pain, constipation and blood in stool.  Genitourinary: Negative for dysuria, hematuria, vaginal bleeding, vaginal discharge and difficulty urinating.  Musculoskeletal: Positive for myalgias (in right leg - only at night in calf). Negative for arthralgias and gait problem.  Skin: Negative for rash and wound.  Allergic/Immunologic: Positive for environmental allergies.  Neurological: Negative for dizziness, tremors and headaches.  Hematological: Negative for adenopathy.  Psychiatric/Behavioral: Negative for sleep disturbance and dysphoric mood.    Patient Active Problem List   Diagnosis Date Noted  . Ganglion cyst of wrist 07/02/2015  . Intermittent palpitations 07/02/2015    . Degenerative arthritis of spine with cord compression 07/02/2015  . Abdominal bloating 07/02/2015  . Baker's cyst of knee 07/02/2015  . Hyperlipidemia, mild 07/02/2015    Prior to Admission medications   Medication Sig Start Date End Date Taking? Authorizing Provider  acetaminophen (TYLENOL) 500 MG tablet Take 1 tablet by mouth as needed.   Yes Historical Provider, MD    Allergies  Allergen Reactions  . Codeine Sulfate Other (See Comments)    "Sickness."  . Sulfa Antibiotics Other (See Comments)    It was a long time ago, cannot remember reaction.    Past Surgical History  Procedure Laterality Date  . Abdominal hysterectomy    . Colonoscopy N/A 03/03/2015    Procedure: COLONOSCOPY;  Surgeon: Lucilla Lame, MD;  Location: [facility];  Service: Gastroenterology;  Laterality: N/A;  . Polypectomy  03/03/2015    Procedure: POLYPECTOMY INTESTINAL;  Surgeon: Lucilla Lame, MD;  Location: [facility];  Service: Gastroenterology;;    Social History  Substance Use Topics  . Smoking status: Former Research scientist (life sciences)  . Smokeless tobacco: None  . Alcohol Use: No     Medication list has been reviewed and updated.   Physical Exam  Constitutional: She is oriented to person, place, and time. She appears well-developed and well-nourished. No distress.  HENT:  Head: Normocephalic and atraumatic.  Right Ear: Tympanic membrane and ear canal normal.  Left Ear: Tympanic membrane and ear canal normal.  Nose: Right sinus exhibits no maxillary sinus tenderness. Left sinus exhibits no maxillary sinus tenderness.  Mouth/Throat: Uvula is midline and oropharynx is clear and moist.  Eyes: Conjunctivae and EOM are normal. Right eye exhibits no discharge. Left eye exhibits no  discharge. No scleral icterus.  Neck: Normal range of motion. Carotid bruit is not present. No erythema present. No thyromegaly present.  Cardiovascular: Normal rate, regular rhythm, normal heart sounds and normal pulses.    Pulmonary/Chest: Effort normal. No respiratory distress. She has no decreased breath sounds. She has wheezes in the right middle field. She has no rhonchi. Right breast exhibits no mass, no nipple discharge, no skin change and no tenderness. Left breast exhibits no mass, no nipple discharge, no skin change and no tenderness.  Abdominal: Soft. Bowel sounds are normal. There is no hepatosplenomegaly. There is no tenderness. There is no CVA tenderness.  Musculoskeletal:       Right knee: She exhibits normal range of motion, no swelling and no effusion.       Left knee: She exhibits normal range of motion, no swelling and no effusion.  Lymphadenopathy:    She has no cervical adenopathy.    She has no axillary adenopathy.  Neurological: She is alert and oriented to person, place, and time. She has normal reflexes. No cranial nerve deficit or sensory deficit.  Skin: Skin is warm, dry and intact. No rash noted.  Psychiatric: She has a normal mood and affect. Her speech is normal and behavior is normal. Thought content normal.  Nursing note and vitals reviewed.   BP 120/68 mmHg  Pulse 76  Ht 5\' 3"  (1.6 m)  Wt 146 lb 9.6 oz (66.497 kg)  BMI 25.98 kg/m2  Assessment and Plan: 1. Annual physical exam Normal exam  Continue self breast exams Pap and pelvic not needed Immunizations up to date after TDap today - Comprehensive metabolic panel - POCT urinalysis dipstick  2. Bronchitis - CBC with Differential/Platelet - DG Chest 2 View; Future - azithromycin (ZITHROMAX Z-PAK) 250 MG tablet; Take 2 po on day 1 then one po daily  Dispense: 6 each; Refill: 0  3. Breast cancer screening - MM DIGITAL SCREENING BILATERAL; Future  4. Need for hepatitis C screening test - Hepatitis C antibody  5. Hyperlipidemia, mild Will advise if medication is needed - Lipid panel  6. Intermittent palpitations No medication recommended due to intermittent occurance - TSH  7. Need for  diphtheria-tetanus-pertussis (Tdap) vaccine - Tdap vaccine greater than or equal to 7yo IM   Halina Maidens, MD [facility]  01/05/2016

## 2016-01-06 LAB — COMPREHENSIVE METABOLIC PANEL
A/G RATIO: 2 (ref 1.2–2.2)
ALT: 20 IU/L (ref 0–32)
AST: 18 IU/L (ref 0–40)
Albumin: 4.7 g/dL (ref 3.6–4.8)
Alkaline Phosphatase: 97 IU/L (ref 39–117)
BUN/Creatinine Ratio: 9 — ABNORMAL LOW (ref 12–28)
BUN: 7 mg/dL — ABNORMAL LOW (ref 8–27)
Bilirubin Total: 0.2 mg/dL (ref 0.0–1.2)
CALCIUM: 9.6 mg/dL (ref 8.7–10.3)
CO2: 25 mmol/L (ref 18–29)
CREATININE: 0.77 mg/dL (ref 0.57–1.00)
Chloride: 100 mmol/L (ref 96–106)
GFR, EST AFRICAN AMERICAN: 96 mL/min/{1.73_m2} (ref >59)
GFR, EST NON AFRICAN AMERICAN: 83 mL/min/{1.73_m2} (ref >59)
GLOBULIN, TOTAL: 2.3 g/dL (ref 1.5–4.5)
Glucose: 84 mg/dL (ref 65–99)
Potassium: 5.2 mmol/L (ref 3.5–5.2)
Sodium: 142 mmol/L (ref 134–144)
TOTAL PROTEIN: 7 g/dL (ref 6.0–8.5)

## 2016-01-06 LAB — CBC WITH DIFFERENTIAL/PLATELET
BASOS: 1 %
Basophils Absolute: 0.1 10*3/uL (ref 0.0–0.2)
EOS (ABSOLUTE): 0.2 10*3/uL (ref 0.0–0.4)
EOS: 3 %
Hematocrit: 44.3 % (ref 34.0–46.6)
Hemoglobin: 14.5 g/dL (ref 11.1–15.9)
IMMATURE GRANS (ABS): 0 10*3/uL (ref 0.0–0.1)
IMMATURE GRANULOCYTES: 0 %
Lymphocytes Absolute: 2.7 10*3/uL (ref 0.7–3.1)
Lymphs: 33 %
MCH: 30 pg (ref 26.6–33.0)
MCHC: 32.7 g/dL (ref 31.5–35.7)
MCV: 92 fL (ref 79–97)
MONOCYTES: 6 %
MONOS ABS: 0.5 10*3/uL (ref 0.1–0.9)
Neutrophils Absolute: 4.8 10*3/uL (ref 1.4–7.0)
Neutrophils: 57 %
Platelets: 354 10*3/uL (ref 150–379)
RBC: 4.83 x10E6/uL (ref 3.77–5.28)
RDW: 14.1 % (ref 12.3–15.4)
WBC: 8.3 10*3/uL (ref 3.4–10.8)

## 2016-01-06 LAB — LIPID PANEL
Chol/HDL Ratio: 3.8 ratio units (ref 0.0–4.4)
Cholesterol, Total: 283 mg/dL — ABNORMAL HIGH (ref 100–199)
HDL: 75 mg/dL (ref >39)
LDL Calculated: 172 mg/dL — ABNORMAL HIGH (ref 0–99)
Triglycerides: 182 mg/dL — ABNORMAL HIGH (ref 0–149)
VLDL CHOLESTEROL CAL: 36 mg/dL (ref 5–40)

## 2016-01-06 LAB — TSH: TSH: 2.22 u[IU]/mL (ref 0.450–4.500)

## 2016-01-06 LAB — HEPATITIS C ANTIBODY: Hep C Virus Ab: <0.1 s/co ratio (ref 0.0–0.9)

## 2016-01-08 ENCOUNTER — Telehealth: Payer: Self-pay

## 2016-01-08 NOTE — Telephone Encounter (Signed)
Advised patient of results.  

## 2016-01-08 NOTE — Telephone Encounter (Signed)
Left message to call back  

## 2016-01-08 NOTE — Telephone Encounter (Signed)
-----   Message from Glean Hess, MD sent at 01/08/2016  8:20 AM EDT ----- Labs are normal except for elevated cholesterol. However, 10 yr risk of heart disease is low at 2% so no medication is recommended at this time. Hepatitis C is negative.

## 2016-01-16 ENCOUNTER — Ambulatory Visit
Admission: RE | Admit: 2016-01-16 | Discharge: 2016-01-16 | Disposition: A | Discharge status: Home / Self Care | Payer: BLUE CROSS/BLUE SHIELD | Source: Ambulatory Visit | Attending: Internal Medicine | Admitting: Internal Medicine

## 2016-01-16 DIAGNOSIS — Z1231 Encounter for screening mammogram for malignant neoplasm of breast: Secondary | ICD-10-CM | POA: Insufficient documentation

## 2016-01-16 DIAGNOSIS — Z1239 Encounter for other screening for malignant neoplasm of breast: Secondary | ICD-10-CM | POA: Insufficient documentation

## 2016-01-17 ENCOUNTER — Emergency Department
Admission: EM | Admit: 2016-01-17 | Discharge: 2016-01-17 | Disposition: A | Discharge status: Home / Self Care | Payer: BLUE CROSS/BLUE SHIELD | Attending: Emergency Medicine | Admitting: Emergency Medicine

## 2016-01-17 ENCOUNTER — Emergency Department: Payer: BLUE CROSS/BLUE SHIELD

## 2016-01-17 ENCOUNTER — Encounter: Payer: Self-pay | Admitting: Medical Oncology

## 2016-01-17 DIAGNOSIS — Z87891 Personal history of nicotine dependence: Secondary | ICD-10-CM | POA: Insufficient documentation

## 2016-01-17 DIAGNOSIS — J209 Acute bronchitis, unspecified: Secondary | ICD-10-CM | POA: Diagnosis not present

## 2016-01-17 DIAGNOSIS — Z792 Long term (current) use of antibiotics: Secondary | ICD-10-CM | POA: Diagnosis not present

## 2016-01-17 DIAGNOSIS — R05 Cough: Secondary | ICD-10-CM | POA: Diagnosis present

## 2016-01-17 MED ORDER — PREDNISONE 10 MG PO TABS: 50.0000 mg | ORAL_TABLET | Freq: Every day | ORAL | Status: DC

## 2016-01-17 MED ORDER — GUAIFENESIN 100 MG/5ML PO SOLN: 15.0000 mL | ORAL | Status: DC | PRN

## 2016-01-17 MED ORDER — IPRATROPIUM-ALBUTEROL 0.5-2.5 (3) MG/3ML IN SOLN
3.0000 mL | Freq: Once | RESPIRATORY_TRACT | Status: AC
  Administered 2016-01-17: 3 mL via RESPIRATORY_TRACT

## 2016-01-17 MED ORDER — ALBUTEROL SULFATE HFA 108 (90 BASE) MCG/ACT IN AERS: 2.0000 | INHALATION_SPRAY | Freq: Four times a day (QID) | RESPIRATORY_TRACT | Status: AC | PRN

## 2016-01-17 MED ORDER — LEVOFLOXACIN 500 MG PO TABS: 500.0000 mg | ORAL_TABLET | Freq: Every day | ORAL | Status: AC

## 2016-01-17 MED ORDER — IPRATROPIUM-ALBUTEROL 0.5-2.5 (3) MG/3ML IN SOLN
RESPIRATORY_TRACT | Status: AC
  Filled 2016-01-17: qty 3

## 2016-01-17 NOTE — ED Notes (Signed)
Pt reports cough x 1 month. Pt reports cough is productive. Took z-pak on the 7th without relief.

## 2016-01-17 NOTE — Discharge Instructions (Signed)

## 2016-01-17 NOTE — ED Provider Notes (Signed)
CSN: [id number]     Arrival date & time 01/17/16  0801 History   First MD Initiated Contact with Patient 01/17/16 [phone]     Chief Complaint  Patient presents with  . Cough     HPI   65 year old female who presents to the emergency department for evaluation of cough 1 month. She states that her cough is productive of thick sputum. She was evaluated by her primary care provider about 3 weeks ago and given a Z-Pak without relief. She continues to have a persistent cough that seems to be worse at night. She is not taking any medications at this time.  History reviewed. No pertinent past medical history. Past Surgical History  Procedure Laterality Date  . Abdominal hysterectomy    . Colonoscopy N/A 03/03/2015    Procedure: COLONOSCOPY;  Surgeon: Lucilla Lame, MD;  Location: [facility];  Service: Gastroenterology;  Laterality: N/A;  . Polypectomy  03/03/2015    Procedure: POLYPECTOMY INTESTINAL;  Surgeon: Lucilla Lame, MD;  Location: [facility];  Service: Gastroenterology;;   Family History  Problem Relation Age of Onset  . CAD Father   . Alzheimer's disease Mother    Social History  Substance Use Topics  . Smoking status: Former Research scientist (life sciences)  . Smokeless tobacco: None  . Alcohol Use: No   OB History    No data available     Review of Systems  Constitutional: Negative for fever and chills.  Respiratory: Positive for cough, shortness of breath and wheezing.   Cardiovascular: Negative for chest pain and palpitations.  Gastrointestinal: Negative for nausea, vomiting and diarrhea.  Skin: Negative.   Neurological: Negative for numbness and headaches.      Allergies  Codeine sulfate and Sulfa antibiotics  Home Medications   Prior to Admission medications   Medication Sig Start Date End Date Taking? Authorizing Provider  acetaminophen (TYLENOL) 500 MG tablet Take 1 tablet by mouth as needed.    Historical Provider, MD  albuterol (PROVENTIL HFA;VENTOLIN HFA) 108 (90  Base) MCG/ACT inhaler Inhale 2 puffs into the lungs every 6 (six) hours as needed for wheezing or shortness of breath. 01/17/16   Victorino Dike, FNP  azithromycin (ZITHROMAX Z-PAK) 250 MG tablet Take 2 po on day 1 then one po daily 01/05/16   Glean Hess, MD  guaiFENesin (ROBITUSSIN) 100 MG/5ML SOLN Take 15 mLs (300 mg total) by mouth every 4 (four) hours as needed for cough or to loosen phlegm. 01/17/16   Victorino Dike, FNP  levofloxacin (LEVAQUIN) 500 MG tablet Take 1 tablet (500 mg total) by mouth daily. 01/17/16 01/27/16  Victorino Dike, FNP  predniSONE (DELTASONE) 10 MG tablet Take 5 tablets (50 mg total) by mouth daily. 01/17/16   Asheley Hellberg B Kyisha Fowle, FNP   BP 118/64 mmHg  Pulse 79  Temp(Src) 98.4 F (36.9 C) (Oral)  Resp 20  Ht 5\' 3"  (1.6 m)  Wt 66.225 kg  BMI 25.87 kg/m2  SpO2 93% Physical Exam  Constitutional: She is oriented to person, place, and time. She appears well-developed.  HENT:  Mouth/Throat: Oropharynx is clear and moist. No oropharyngeal exudate.  Eyes: EOM are normal.  Neck: Normal range of motion.  Pulmonary/Chest: Effort normal and breath sounds normal. No accessory muscle usage. No respiratory distress.  Musculoskeletal: Normal range of motion.  Lymphadenopathy:    She has no cervical adenopathy.  Neurological: She is alert and oriented to person, place, and time.  Skin: Skin is warm and dry.  Psychiatric:  She has a normal mood and affect. Her behavior is normal. Judgment and thought content normal.  Nursing note and vitals reviewed.   ED Course  Procedures (including critical care time) Labs Review Labs Reviewed - No data to display  Imaging Review Background chronic changes with no evidence of acute cardiopulmonary disease.   I have personally reviewed and evaluated these images and lab results as part of my medical decision-making.   EKG Interpretation None      MDM   Final diagnoses:  Bronchitis, acute, with bronchospasm    This x-ray  results reviewed with the patient. She will be started on albuterol, prednisone, Levaquin, and given Robitussin-AC for the cough. She was encouraged to follow up with her primary care provider for symptoms are not improving over the next 2-3 days. She was encouraged to return to the emergency department for symptoms that change or worsen if she is unable schedule an appointment.    Victorino Dike, FNP 01/17/16 [place], MD 01/17/16 [phone]

## 2016-01-19 ENCOUNTER — Emergency Department: Payer: BLUE CROSS/BLUE SHIELD

## 2016-01-19 ENCOUNTER — Emergency Department
Admission: EM | Admit: 2016-01-19 | Discharge: 2016-01-19 | Disposition: A | Discharge status: Home / Self Care | Payer: BLUE CROSS/BLUE SHIELD | Attending: Emergency Medicine | Admitting: Emergency Medicine

## 2016-01-19 DIAGNOSIS — J9801 Acute bronchospasm: Secondary | ICD-10-CM | POA: Insufficient documentation

## 2016-01-19 DIAGNOSIS — Z87891 Personal history of nicotine dependence: Secondary | ICD-10-CM | POA: Diagnosis not present

## 2016-01-19 DIAGNOSIS — Z79899 Other long term (current) drug therapy: Secondary | ICD-10-CM | POA: Insufficient documentation

## 2016-01-19 DIAGNOSIS — R0602 Shortness of breath: Secondary | ICD-10-CM | POA: Diagnosis present

## 2016-01-19 LAB — BASIC METABOLIC PANEL
Anion gap: 7 (ref 5–15)
BUN: 9 mg/dL (ref 6–20)
CHLORIDE: 106 mmol/L (ref 101–111)
CO2: 25 mmol/L (ref 22–32)
CREATININE: 0.91 mg/dL (ref 0.44–1.00)
Calcium: 8.5 mg/dL — ABNORMAL LOW (ref 8.9–10.3)
GFR calc Af Amer: >60 mL/min (ref >60)
GLUCOSE: 122 mg/dL — AB (ref 65–99)
Potassium: 3.8 mmol/L (ref 3.5–5.1)
SODIUM: 138 mmol/L (ref 135–145)

## 2016-01-19 LAB — CBC WITH DIFFERENTIAL/PLATELET
Basophils Absolute: 0 K/uL (ref 0–0.1)
Basophils Relative: 0 %
Eosinophils Absolute: 0 K/uL (ref 0–0.7)
Eosinophils Relative: 0 %
HCT: 40.3 % (ref 35.0–47.0)
Hemoglobin: 13.5 g/dL (ref 12.0–16.0)
Lymphocytes Relative: 5 %
Lymphs Abs: 0.6 K/uL — ABNORMAL LOW (ref 1.0–3.6)
MCH: 30.1 pg (ref 26.0–34.0)
MCHC: 33.5 g/dL (ref 32.0–36.0)
MCV: 89.7 fL (ref 80.0–100.0)
Monocytes Absolute: 0.4 K/uL (ref 0.2–0.9)
Monocytes Relative: 4 %
Neutro Abs: 10.4 K/uL — ABNORMAL HIGH (ref 1.4–6.5)
Neutrophils Relative %: 91 %
Platelets: 283 K/uL (ref 150–440)
RBC: 4.49 MIL/uL (ref 3.80–5.20)
RDW: 14.1 % (ref 11.5–14.5)
WBC: 11.4 K/uL — ABNORMAL HIGH (ref 3.6–11.0)

## 2016-01-19 LAB — TROPONIN I: Troponin I: <0.03 ng/mL (ref <0.031)

## 2016-01-19 MED ORDER — IPRATROPIUM-ALBUTEROL 0.5-2.5 (3) MG/3ML IN SOLN
3.0000 mL | Freq: Once | RESPIRATORY_TRACT | Status: AC
  Administered 2016-01-19: 3 mL via RESPIRATORY_TRACT
  Filled 2016-01-19: qty 3

## 2016-01-19 MED ORDER — IOPAMIDOL (ISOVUE-370) INJECTION 76%
75.0000 mL | Freq: Once | INTRAVENOUS | Status: AC | PRN
  Administered 2016-01-19: 75 mL via INTRAVENOUS

## 2016-01-19 NOTE — Discharge Instructions (Signed)
You were evaluated for wheezing and shortness of breath, and your exam and evaluation are reassuring. Your CT scan shows no additional complication. As we discussed, I am most suspicious of wheezing due to viral upper respiratory infection or seasonal allergies, and possible underlying COPD.  Use your albuterol inhaler 2 puffs every 4 hours as needed for wheezing and shortness of breath. Continue your medications.  Return to the emergency room for any worsening condition including trouble breathing, can't catch her breath, chest pain, fever, or any other symptoms concerning to you.   Bronchospasm, Adult A bronchospasm is a spasm or tightening of the airways going into the lungs. During a bronchospasm breathing becomes more difficult because the airways get smaller. When this happens there can be coughing, a whistling sound when breathing (wheezing), and difficulty breathing. Bronchospasm is often associated with asthma, but not all patients who experience a bronchospasm have asthma. CAUSES  A bronchospasm is caused by inflammation or irritation of the airways. The inflammation or irritation may be triggered by:   Allergies (such as to animals, pollen, food, or mold). Allergens that cause bronchospasm may cause wheezing immediately after exposure or many hours later.   Infection. Viral infections are believed to be the most common cause of bronchospasm.   Exercise.   Irritants (such as pollution, cigarette smoke, strong odors, aerosol sprays, and paint fumes).   Weather changes. Winds increase molds and pollens in the air. Rain refreshes the air by washing irritants out. Cold air may cause inflammation.   Stress and emotional upset.  SIGNS AND SYMPTOMS   Wheezing.   Excessive nighttime coughing.   Frequent or severe coughing with a simple cold.   Chest tightness.   Shortness of breath.  DIAGNOSIS  Bronchospasm is usually diagnosed through a history and physical exam.  Tests, such as chest X-rays, are sometimes done to look for other conditions. TREATMENT   Inhaled medicines can be given to open up your airways and help you breathe. The medicines can be given using either an inhaler or a nebulizer machine.  Corticosteroid medicines may be given for severe bronchospasm, usually when it is associated with asthma. HOME CARE INSTRUCTIONS   Always have a plan prepared for seeking medical care. Know when to call your health care provider and local emergency services (911 in the [place] Know where you can access local emergency care.  Only take medicines as directed by your health care provider.  If you were prescribed an inhaler or nebulizer machine, ask your health care provider to explain how to use it correctly. Always use a spacer with your inhaler if you were given one.  It is necessary to remain calm during an attack. Try to relax and breathe more slowly.  Control your home environment in the following ways:   Change your heating and air conditioning filter at least once a month.   Limit your use of fireplaces and wood stoves.  Do not smoke and do not allow smoking in your home.   Avoid exposure to perfumes and fragrances.   Get rid of pests (such as roaches and mice) and their droppings.   Throw away plants if you see mold on them.   Keep your house clean and dust free.   Replace carpet with wood, tile, or vinyl flooring. Carpet can trap dander and dust.   Use allergy-proof pillows, mattress covers, and box spring covers.   Wash bed sheets and blankets every week in hot water and dry them in a  dryer.   Use blankets that are made of polyester or cotton.   Wash hands frequently. SEEK MEDICAL CARE IF:   You have muscle aches.   You have chest pain.   The sputum changes from clear or white to yellow, green, gray, or bloody.   The sputum you cough up gets thicker.   There are problems that may be related to the medicine  you are given, such as a rash, itching, swelling, or trouble breathing.  SEEK IMMEDIATE MEDICAL CARE IF:  1. You have worsening wheezing and coughing even after taking your prescribed medicines.  2. You have increased difficulty breathing.  3. You develop severe chest pain. MAKE SURE YOU:   Understand these instructions.  Will watch your condition.  Will get help right away if you are not doing well or get worse.   This information is not intended to replace advice given to you by your health care provider. Make sure you discuss any questions you have with your health care provider.   Document Released: 09/19/2003 Document Revised: 10/07/2014 Document Reviewed: 03/08/2013 Elsevier Interactive Patient Education 2016 [employer]   Chronic Obstructive Pulmonary Disease Chronic obstructive pulmonary disease (COPD) is a common lung condition in which airflow from the lungs is limited. COPD is a general term that can be used to describe many different lung problems that limit airflow, including both chronic bronchitis and emphysema. If you have COPD, your lung function will probably never return to normal, but there are measures you can take to improve lung function and make yourself feel better. CAUSES   Smoking (common).  Exposure to secondhand smoke.  Genetic problems.  Chronic inflammatory lung diseases or recurrent infections. SYMPTOMS  Shortness of breath, especially with physical activity.  Deep, persistent (chronic) cough with a large amount of thick mucus.  Wheezing.  Rapid breaths (tachypnea).  Gray or bluish discoloration (cyanosis) of the skin, especially in your fingers, toes, or lips.  Fatigue.  Weight loss.  Frequent infections or episodes when breathing symptoms become much worse (exacerbations).  Chest tightness. DIAGNOSIS Your health care provider will take a medical history and perform a physical examination to diagnose COPD. Additional tests for  COPD may include:  Lung (pulmonary) function tests.  Chest X-ray.  CT scan.  Blood tests. TREATMENT  Treatment for COPD may include:  Inhaler and nebulizer medicines. These help manage the symptoms of COPD and make your breathing more comfortable.  Supplemental oxygen. Supplemental oxygen is only helpful if you have a low oxygen level in your blood.  Exercise and physical activity. These are beneficial for nearly all people with COPD.  Lung surgery or transplant.  Nutrition therapy to gain weight, if you are underweight.  Pulmonary rehabilitation. This may involve working with a team of health care providers and specialists, such as respiratory, occupational, and physical therapists. HOME CARE INSTRUCTIONS  Take all medicines (inhaled or pills) as directed by your health care provider.  Avoid over-the-counter medicines or cough syrups that dry up your airway (such as antihistamines) and slow down the elimination of secretions unless instructed otherwise by your health care provider.  If you are a smoker, the most important thing that you can do is stop smoking. Continuing to smoke will cause further lung damage and breathing trouble. Ask your health care provider for help with quitting smoking. He or she can direct you to community resources or hospitals that provide support.  Avoid exposure to irritants such as smoke, chemicals, and fumes that aggravate  your breathing.  Use oxygen therapy and pulmonary rehabilitation if directed by your health care provider. If you require home oxygen therapy, ask your health care provider whether you should purchase a pulse oximeter to measure your oxygen level at home.  Avoid contact with individuals who have a contagious illness.  Avoid extreme temperature and humidity changes.  Eat healthy foods. Eating smaller, more frequent meals and resting before meals may help you maintain your strength.  Stay active, but balance activity with  periods of rest. Exercise and physical activity will help you maintain your ability to do things you want to do.  Preventing infection and hospitalization is very important when you have COPD. Make sure to receive all the vaccines your health care provider recommends, especially the pneumococcal and influenza vaccines. Ask your health care provider whether you need a pneumonia vaccine.  Learn and use relaxation techniques to manage stress.  Learn and use controlled breathing techniques as directed by your health care provider. Controlled breathing techniques include:  Pursed lip breathing. Start by breathing in (inhaling) through your nose for 1 second. Then, purse your lips as if you were going to whistle and breathe out (exhale) through the pursed lips for 2 seconds.  Diaphragmatic breathing. Start by putting one hand on your abdomen just above your waist. Inhale slowly through your nose. The hand on your abdomen should move out. Then purse your lips and exhale slowly. You should be able to feel the hand on your abdomen moving in as you exhale.  Learn and use controlled coughing to clear mucus from your lungs. Controlled coughing is a series of short, progressive coughs. The steps of controlled coughing are: 4. Lean your head slightly forward. 5. Breathe in deeply using diaphragmatic breathing. 6. Try to hold your breath for 3 seconds. 7. Keep your mouth slightly open while coughing twice. 8. Spit any mucus out into a tissue. 9. Rest and repeat the steps once or twice as needed. SEEK MEDICAL CARE IF:  You are coughing up more mucus than usual.  There is a change in the color or thickness of your mucus.  Your breathing is more labored than usual.  Your breathing is faster than usual. SEEK IMMEDIATE MEDICAL CARE IF:  You have shortness of breath while you are resting.  You have shortness of breath that prevents you from:  Being able to talk.  Performing your usual physical  activities.  You have chest pain lasting longer than 5 minutes.  Your skin color is more cyanotic than usual.  You measure low oxygen saturations for longer than 5 minutes with a pulse oximeter. MAKE SURE YOU:  Understand these instructions.  Will watch your condition.  Will get help right away if you are not doing well or get worse.   This information is not intended to replace advice given to you by your health care provider. Make sure you discuss any questions you have with your health care provider.   Document Released: 06/26/2005 Document Revised: 10/07/2014 Document Reviewed: 05/13/2013 Elsevier Interactive Patient Education [employer].

## 2016-01-19 NOTE — ED Provider Notes (Signed)
[facility] Emergency Department Provider Note   ____________________________________________  Time seen: Approximately 10:45 AM I have reviewed the triage vital signs and the triage nursing note.  HISTORY  Chief Complaint Cough and Shortness of Breath   Historian Patient  HPI Eileen Mills is a 65 y.o. female who quit smoking 7 years ago, and does not report known diagnosis of COPD, is here for shortness of breath and coughing for about over a month. She had been treated by a physician with a Z-Pak when she had thick sputum, was no better. 2 days ago she was seen at the emergency department Flex care area and was discharged with albuterol inhaler which she is only taking once at night, prednisone,Levaquin, and Robitussin.  Denies chest pain. She is still wheezing and has a dry cough. No fevers. No vomiting. No prior use of oxygen. Symptoms are moderate. Walking makes it worse.    History reviewed. No pertinent past medical history.  Patient Active Problem List   Diagnosis Date Noted  . Ganglion cyst of wrist 07/02/2015  . Intermittent palpitations 07/02/2015  . Degenerative arthritis of spine with cord compression 07/02/2015  . Abdominal bloating 07/02/2015  . Baker's cyst of knee 07/02/2015  . Hyperlipidemia, mild 07/02/2015    Past Surgical History  Procedure Laterality Date  . Abdominal hysterectomy    . Colonoscopy N/A 03/03/2015    Procedure: COLONOSCOPY;  Surgeon: Lucilla Lame, MD;  Location: [place];  Service: Gastroenterology;  Laterality: N/A;  . Polypectomy  03/03/2015    Procedure: POLYPECTOMY INTESTINAL;  Surgeon: Lucilla Lame, MD;  Location: [place];  Service: Gastroenterology;;    Current Outpatient Rx  Name  Route  Sig  Dispense  Refill  . acetaminophen (TYLENOL) 500 MG tablet   Oral   Take 1 tablet by mouth daily as needed for mild pain or moderate pain.          Marland Kitchen albuterol (PROVENTIL HFA;VENTOLIN  HFA) 108 (90 Base) MCG/ACT inhaler   Inhalation   Inhale 2 puffs into the lungs every 6 (six) hours as needed for wheezing or shortness of breath.   1 Inhaler   2   . azithromycin (ZITHROMAX Z-PAK) 250 MG tablet      Take 2 po on day 1 then one po daily   6 each   0   . guaiFENesin (ROBITUSSIN) 100 MG/5ML SOLN   Oral   Take 15 mLs (300 mg total) by mouth every 4 (four) hours as needed for cough or to loosen phlegm.   473 mL   0   . levofloxacin (LEVAQUIN) 500 MG tablet   Oral   Take 1 tablet (500 mg total) by mouth daily.   7 tablet   0   . predniSONE (DELTASONE) 10 MG tablet   Oral   Take 5 tablets (50 mg total) by mouth daily.   25 tablet   0     Allergies Codeine sulfate and Sulfa antibiotics  Family History  Problem Relation Age of Onset  . CAD Father   . Alzheimer's disease Mother     Social History Social History  Substance Use Topics  . Smoking status: Former Research scientist (life sciences)  . Smokeless tobacco: None  . Alcohol Use: No    Review of Systems  Constitutional: Negative for fever. Eyes: Negative for visual changes. ENT: Negative for sore throat. Cardiovascular: Negative for chest pain. Respiratory: Positive for shortness of breath. Gastrointestinal: Negative for abdominal pain, vomiting and diarrhea.  Genitourinary: Negative for dysuria. Musculoskeletal: Negative for back pain. Skin: Negative for rash. Neurological: Negative for headache. 10 point Review of Systems otherwise negative ____________________________________________   PHYSICAL EXAM:  VITAL SIGNS: ED Triage Vitals  Enc Vitals Group     BP 01/19/16 0921 135/79 mmHg     Pulse Rate 01/19/16 0921 102     Resp 01/19/16 0921 24     Temp 01/19/16 0921 98.3 F (36.8 C)     Temp Source 01/19/16 0921 Oral     SpO2 01/19/16 0921 91 %     Weight 01/19/16 0921 146 lb (66.225 kg)     Height 01/19/16 0921 5\' 3"  (1.6 m)     Head Cir --      Peak Flow --      Pain Score 01/19/16 0931 0     Pain Loc  --      Pain Edu? --      Excl. in [place]? --      Constitutional: Alert and oriented. Well appearing and in no distress. HEENT   Head: Normocephalic and atraumatic.      Eyes: Conjunctivae are normal. PERRL. Normal extraocular movements.      Ears:         Nose: No congestion/rhinnorhea.   Mouth/Throat: Mucous membranes are moist.   Neck: No stridor. Cardiovascular/Chest: Normal rate, regular rhythm.  No murmurs, rubs, or gallops. Respiratory: Normal respiratory effort without tachypnea nor retractions. Tight breath sounds throughout, and expiratory wheezing.  Gastrointestinal: Soft. No distention, no guarding, no rebound. Nontender.    Genitourinary/rectal:Deferred Musculoskeletal: Nontender with normal range of motion in all extremities. No joint effusions.  No lower extremity tenderness.  No edema. Neurologic:  Normal speech and language. No gross or focal neurologic deficits are appreciated. Skin:  Skin is warm, dry and intact. No rash noted. Psychiatric: Mood and affect are normal. Speech and behavior are normal. Patient exhibits appropriate insight and judgment.  ____________________________________________   EKG I, Lisa Roca, MD, the attending physician have personally viewed and interpreted all ECGs.  85 beats per normal sinus rhythm. Narrow QRS. Normal axis. Nonspecific ST changes, minimal ST segment depression inferior and laterally. ____________________________________________  LABS (pertinent positives/negatives)  Basic metabolic panel without significant abnormalities Troponin less than 0.03 White blood count 11.4, hemoglobin 13.5 and platelet count 283  ____________________________________________  RADIOLOGY All Xrays were viewed by me. Imaging interpreted by Radiologist.  Chest CT with contrast for PE:  IMPRESSION: Mild bronchitic changes with some mucous plugging in the right lower lobe.  No evidence of pulmonary embolism.  Calcified left  thyroid nodule.  Small hiatal hernia. __________________________________________  PROCEDURES  Procedure(s) performed: None  Critical Care performed: None  ____________________________________________   ED COURSE / ASSESSMENT AND PLAN  Pertinent labs & imaging results that were available during my care of the patient were reviewed by me and considered in my medical decision making (see chart for details).  Although the patient's clinical presentation seems most consistent with bronchospasm, persistent bronchitis/COPD exacerbation, symptoms have been ongoing now for over a month, and she did have some episodes of mild tachycardia here and oxygen sat 89-91% on room air. I treated with albuterol/ipratropium and reassess.  I'm also going to CT her chest to rule out PE, also get a better visualization May showed there are no other findings. I discussed with her the risk versus benefit, and we chose to proceed.  ----------------------------------------- 2:18 PM on 01/19/2016 -----------------------------------------  Her chest CT shows no PE or  other emergency condition. Patient is actually breathing much better after DuoNeb treatments. She is already on prednisone and Levaquin I am asking her finish these. I have asked her to increase her use of the albuterol as needed.  We discussed that I suspect with increased use of albuterol she'll hopefully overcome this exacerbation, however there is some chance that if she worsens she would need to come back to the hospital and be hospitalized for additional treatment.  I am going to diagnosis is acute bronchospasm, that may be related to either viral URI, versus seasonal allergies, however with her smoking history am suspicious of a component of underlying COPD. I preferred the patient with the office number for pulmonology, or she can be referred from her primary care physician's office.  CONSULTATIONS:   None   Patient / Family / Caregiver  informed of clinical course, medical decision-making process, and agree with plan.   I discussed return precautions, follow-up instructions, and discharged instructions with patient and/or family.   ___________________________________________   FINAL CLINICAL IMPRESSION(S) / ED DIAGNOSES   Final diagnoses:  Acute bronchospasm              Note: This dictation was prepared with Dragon dictation. Any transcriptional errors that result from this process are unintentional   Lisa Roca, MD 01/19/16 1419

## 2016-01-19 NOTE — ED Notes (Signed)
Patient stood up and moved around bed to have EKG done. During movement, her O2 dropped to 87%. It returned to 92% after 3 minutes of rest. RN made aware.

## 2016-01-19 NOTE — ED Notes (Signed)
Pt c/o cough with congestion for the past 2 weeks, was seen here 2 days ago and states her sx are worse with SOB.Eileen Mills

## 2016-07-03 ENCOUNTER — Ambulatory Visit: Payer: BLUE CROSS/BLUE SHIELD

## 2016-12-25 ENCOUNTER — Other Ambulatory Visit: Payer: Self-pay | Admitting: Internal Medicine

## 2016-12-25 DIAGNOSIS — Z1231 Encounter for screening mammogram for malignant neoplasm of breast: Secondary | ICD-10-CM

## 2017-01-04 ENCOUNTER — Encounter: Payer: Self-pay | Admitting: Internal Medicine

## 2017-01-07 ENCOUNTER — Encounter: Payer: Self-pay | Admitting: Internal Medicine

## 2017-01-07 ENCOUNTER — Ambulatory Visit (INDEPENDENT_AMBULATORY_CARE_PROVIDER_SITE_OTHER): Payer: BLUE CROSS/BLUE SHIELD | Admitting: Internal Medicine

## 2017-01-07 VITALS — BP 112/64 | HR 72 | Ht 63.0 in | Wt 143.8 lb

## 2017-01-07 DIAGNOSIS — E785 Hyperlipidemia, unspecified: Secondary | ICD-10-CM | POA: Diagnosis not present

## 2017-01-07 DIAGNOSIS — Z0000 Encounter for general adult medical examination without abnormal findings: Secondary | ICD-10-CM | POA: Diagnosis not present

## 2017-01-07 DIAGNOSIS — Z23 Encounter for immunization: Secondary | ICD-10-CM | POA: Diagnosis not present

## 2017-01-07 DIAGNOSIS — J449 Chronic obstructive pulmonary disease, unspecified: Secondary | ICD-10-CM | POA: Diagnosis not present

## 2017-01-07 DIAGNOSIS — R002 Palpitations: Secondary | ICD-10-CM

## 2017-01-07 LAB — POCT URINALYSIS DIPSTICK
Bilirubin, UA: NEGATIVE
Blood, UA: NEGATIVE
Glucose, UA: NEGATIVE
Ketones, UA: NEGATIVE
LEUKOCYTES UA: NEGATIVE
NITRITE UA: NEGATIVE
PH UA: 5 (ref 5.0–8.0)
PROTEIN UA: NEGATIVE
Spec Grav, UA: 1.015 (ref 1.030–1.035)
Urobilinogen, UA: 0.2 (ref ?–2.0)

## 2017-01-07 NOTE — Progress Notes (Signed)
Date:  01/07/2017   Name:  Tinley Rought   DOB:  01-12-1951   MRN:  [id number]   Chief Complaint: Annual Exam (Breast Exam. No pap- hysterectomy) Eileen Mills is a 66 y.o. female who presents today for her Complete Annual Exam. She feels well. She reports exercising walking. She reports she is sleeping well. She has a mammogram scheduled and denies breast complaints. New dx of COPD - started on inhalers by Dr. Raul Del and feeling better.   Review of Systems  Constitutional: Negative for chills, fatigue and fever.  HENT: Negative for congestion, hearing loss, tinnitus, trouble swallowing and voice change.   Eyes: Negative for visual disturbance.  Respiratory: Positive for shortness of breath. Negative for cough, chest tightness and wheezing.   Cardiovascular: Positive for palpitations. Negative for chest pain and leg swelling.  Gastrointestinal: Negative for abdominal pain, constipation, diarrhea and vomiting.  Endocrine: Negative for polydipsia and polyuria.  Genitourinary: Negative for dysuria, frequency, genital sores, vaginal bleeding and vaginal discharge.  Musculoskeletal: Negative for arthralgias, gait problem and joint swelling.  Skin: Negative for color change and rash.  Allergic/Immunologic: Negative for environmental allergies.  Neurological: Negative for dizziness, tremors, light-headedness and headaches.  Hematological: Negative for adenopathy. Does not bruise/bleed easily.  Psychiatric/Behavioral: Negative for dysphoric mood and sleep disturbance. The patient is not nervous/anxious.     Patient Active Problem List   Diagnosis Date Noted  . Ganglion cyst of wrist 07/02/2015  . Intermittent palpitations 07/02/2015  . Degenerative arthritis of spine with cord compression 07/02/2015  . Abdominal bloating 07/02/2015  . Baker's cyst of knee 07/02/2015  . Hyperlipidemia, mild 07/02/2015    Prior to Admission medications   Medication Sig Start Date  End Date Taking? Authorizing Provider  acetaminophen (TYLENOL) 500 MG tablet Take 1 tablet by mouth daily as needed for mild pain or moderate pain.    Yes Historical Provider, MD  albuterol (PROVENTIL HFA;VENTOLIN HFA) 108 (90 Base) MCG/ACT inhaler Inhale 2 puffs into the lungs every 6 (six) hours as needed for wheezing or shortness of breath. 01/17/16  Yes Cari B Triplett, FNP  Tiotropium Bromide Monohydrate (SPIRIVA RESPIMAT) 2.5 MCG/ACT AERS Inhale 2 puffs into the lungs every morning.   Yes Historical Provider, MD    Allergies  Allergen Reactions  . Codeine Sulfate Other (See Comments)    "Sickness."  . Sulfa Antibiotics Other (See Comments)    It was a long time ago, cannot remember reaction.    Past Surgical History:  Procedure Laterality Date  . ABDOMINAL HYSTERECTOMY    . COLONOSCOPY N/A 03/03/2015   Procedure: COLONOSCOPY;  Surgeon: Lucilla Lame, MD;  Location: [facility];  Service: Gastroenterology;  Laterality: N/A;  . POLYPECTOMY  03/03/2015   Procedure: POLYPECTOMY INTESTINAL;  Surgeon: Lucilla Lame, MD;  Location: [facility];  Service: Gastroenterology;;    Social History  Substance Use Topics  . Smoking status: Former Research scientist (life sciences)  . Smokeless tobacco: Never Used  . Alcohol use No     Medication list has been reviewed and updated.   Physical Exam  Constitutional: She is oriented to person, place, and time. She appears well-developed and well-nourished. No distress.  HENT:  Head: Normocephalic and atraumatic.  Eyes: Conjunctivae and EOM are normal. Right eye exhibits no discharge. Left eye exhibits no discharge. No scleral icterus.  Neck: Normal range of motion. Carotid bruit is not present. No erythema present. No thyromegaly present.  Cardiovascular: Normal rate, regular rhythm, normal heart sounds and  normal pulses.   Pulmonary/Chest: Effort normal. No respiratory distress. She has wheezes (few scattered ). Right breast exhibits no mass, no nipple  discharge, no skin change and no tenderness. Left breast exhibits no mass, no nipple discharge, no skin change and no tenderness.  Abdominal: Soft. Bowel sounds are normal. There is no hepatosplenomegaly. There is no tenderness. There is no CVA tenderness.  Musculoskeletal: She exhibits no edema or tenderness.       Right hip: Normal.       Left hip: Normal.  Lymphadenopathy:    She has no cervical adenopathy.    She has no axillary adenopathy.  Neurological: She is alert and oriented to person, place, and time. She has normal reflexes. No cranial nerve deficit or sensory deficit.  Skin: Skin is warm, dry and intact. No rash noted.  Psychiatric: She has a normal mood and affect. Her speech is normal and behavior is normal. Thought content normal.  Nursing note and vitals reviewed.   BP 112/64 (BP Location: Right Arm, Patient Position: Sitting, Cuff Size: Normal)   Pulse 72   Ht 5\' 3"  (1.6 m)   Wt 143 lb 12.8 oz (65.2 kg)   SpO2 96%   BMI 25.47 kg/m   Assessment and Plan: 1. Annual physical exam Continue healthy diet Mammogram scheduled - POCT urinalysis dipstick  2. Stage 2 moderate COPD by GOLD classification ([place]) Seen by Dr. Raul Del - CBC with Differential/Platelet  3. Hyperlipidemia, mild - Lipid panel - TSH  4. Intermittent palpitations Consider Zio patch - Comprehensive metabolic panel  5. Need for pneumococcal vaccination - Pneumococcal polysaccharide vaccine 23-valent greater than or equal to 2yo subcutaneous/IM   No orders of the defined types were placed in this encounter.   Halina Maidens, MD [facility]  01/07/2017

## 2017-01-07 NOTE — Patient Instructions (Signed)
If palpitations recur, call for a NURSE APPT to have a ZIO patch monitor placed.

## 2017-01-08 LAB — COMPREHENSIVE METABOLIC PANEL
ALT: 22 IU/L (ref 0–32)
AST: 20 IU/L (ref 0–40)
Albumin/Globulin Ratio: 2.1 (ref 1.2–2.2)
Albumin: 4.7 g/dL (ref 3.6–4.8)
Alkaline Phosphatase: 101 IU/L (ref 39–117)
BUN/Creatinine Ratio: 11 — ABNORMAL LOW (ref 12–28)
BUN: 9 mg/dL (ref 8–27)
Bilirubin Total: 0.2 mg/dL (ref 0.0–1.2)
CALCIUM: 10.7 mg/dL — AB (ref 8.7–10.3)
CHLORIDE: 97 mmol/L (ref 96–106)
CO2: 27 mmol/L (ref 18–29)
CREATININE: 0.81 mg/dL (ref 0.57–1.00)
GFR calc non Af Amer: 78 mL/min/{1.73_m2} (ref >59)
GFR, EST AFRICAN AMERICAN: 89 mL/min/{1.73_m2} (ref >59)
GLUCOSE: 96 mg/dL (ref 65–99)
Globulin, Total: 2.2 g/dL (ref 1.5–4.5)
Potassium: 5 mmol/L (ref 3.5–5.2)
Sodium: 140 mmol/L (ref 134–144)
TOTAL PROTEIN: 6.9 g/dL (ref 6.0–8.5)

## 2017-01-08 LAB — CBC WITH DIFFERENTIAL/PLATELET
BASOS ABS: 0.1 10*3/uL (ref 0.0–0.2)
Basos: 1 %
EOS (ABSOLUTE): 0.2 10*3/uL (ref 0.0–0.4)
Eos: 3 %
Hematocrit: 43.5 % (ref 34.0–46.6)
Hemoglobin: 14.3 g/dL (ref 11.1–15.9)
IMMATURE GRANS (ABS): 0 10*3/uL (ref 0.0–0.1)
IMMATURE GRANULOCYTES: 0 %
LYMPHS: 32 %
Lymphocytes Absolute: 2.8 10*3/uL (ref 0.7–3.1)
MCH: 30.6 pg (ref 26.6–33.0)
MCHC: 32.9 g/dL (ref 31.5–35.7)
MCV: 93 fL (ref 79–97)
MONOCYTES: 6 %
Monocytes Absolute: 0.5 10*3/uL (ref 0.1–0.9)
NEUTROS PCT: 58 %
Neutrophils Absolute: 5 10*3/uL (ref 1.4–7.0)
PLATELETS: 359 10*3/uL (ref 150–379)
RBC: 4.67 x10E6/uL (ref 3.77–5.28)
RDW: 14.1 % (ref 12.3–15.4)
WBC: 8.6 10*3/uL (ref 3.4–10.8)

## 2017-01-08 LAB — LIPID PANEL
Chol/HDL Ratio: 3.8 ratio (ref 0.0–4.4)
Cholesterol, Total: 243 mg/dL — ABNORMAL HIGH (ref 100–199)
HDL: 64 mg/dL (ref >39)
LDL Calculated: 135 mg/dL — ABNORMAL HIGH (ref 0–99)
Triglycerides: 220 mg/dL — ABNORMAL HIGH (ref 0–149)
VLDL CHOLESTEROL CAL: 44 mg/dL — AB (ref 5–40)

## 2017-01-08 LAB — TSH: TSH: 1.7 u[IU]/mL (ref 0.450–4.500)

## 2017-01-16 ENCOUNTER — Ambulatory Visit
Admission: RE | Admit: 2017-01-16 | Discharge: 2017-01-16 | Disposition: A | Discharge status: Home / Self Care | Payer: BLUE CROSS/BLUE SHIELD | Source: Ambulatory Visit | Attending: Internal Medicine | Admitting: Internal Medicine

## 2017-01-16 DIAGNOSIS — Z1231 Encounter for screening mammogram for malignant neoplasm of breast: Secondary | ICD-10-CM | POA: Insufficient documentation

## 2017-06-11 ENCOUNTER — Encounter: Payer: Self-pay | Admitting: Internal Medicine

## 2017-06-11 ENCOUNTER — Ambulatory Visit (INDEPENDENT_AMBULATORY_CARE_PROVIDER_SITE_OTHER): Payer: BLUE CROSS/BLUE SHIELD | Admitting: Internal Medicine

## 2017-06-11 VITALS — BP 122/64 | HR 72 | Ht 63.0 in | Wt 139.0 lb

## 2017-06-11 DIAGNOSIS — S29011A Strain of muscle and tendon of front wall of thorax, initial encounter: Secondary | ICD-10-CM | POA: Diagnosis not present

## 2017-06-11 MED ORDER — CYCLOBENZAPRINE HCL 10 MG PO TABS: 10.0000 mg | ORAL_TABLET | Freq: Three times a day (TID) | ORAL | 0 refills | Status: DC | PRN

## 2017-06-11 NOTE — Progress Notes (Signed)
Date:  06/11/2017   Name:  Eileen Mills   DOB:  1951-01-01   MRN:  [id number]   Chief Complaint: Flank Pain (Right sided pain- Have been lifting things she should not have. Started x2 DAYS ago. Pain feels like- sharp pains when turning or coughing. Dull pain when sitting. Took 900 mg of aspirin before bed, 1000 mg of ibuprofen, and Tylenol 500 mg. Pain went away throughout night- pain was back this morning. Ice and heat used and no relief.  )  She spent several days cleaning out closets lifting heavy bins of clothing and other items. Also went to [facility] and lifted several heavy bags of deer corn and distended abdomen her yard. 2 days ago she noted sharp pain at her right lateral rib. Last night she took ibuprofen Tylenol and used ice to get some relief for rest she denies fever chills cough or shortness of breath. The pain is worsened by twisting coughing or bending  Review of Systems  Constitutional: Negative for chills, fatigue and fever.  Respiratory: Negative for cough, chest tightness, shortness of breath and wheezing.   Cardiovascular: Negative for chest pain and palpitations.  Musculoskeletal: Positive for myalgias.    Patient Active Problem List   Diagnosis Date Noted  . Stage 2 moderate COPD by GOLD classification ([place]) 01/07/2017  . Ganglion cyst of wrist 07/02/2015  . Intermittent palpitations 07/02/2015  . Degenerative arthritis of spine with cord compression 07/02/2015  . Abdominal bloating 07/02/2015  . Baker's cyst of knee 07/02/2015  . Hyperlipidemia, mild 07/02/2015    Prior to Admission medications   Medication Sig Start Date End Date Taking? Authorizing Provider  albuterol (PROVENTIL HFA;VENTOLIN HFA) 108 (90 Base) MCG/ACT inhaler Inhale 2 puffs into the lungs every 6 (six) hours as needed for wheezing or shortness of breath. 01/17/16  Yes Triplett, Cari B, FNP  Ascorbic Acid (VITAMIN C) 1000 MG tablet Take 1,000 mg by mouth daily.   Yes [provider]  Cholecalciferol (D3-50) 50000 units capsule Take 5,000 Units by mouth daily.   Yes [provider]  Multiple Vitamins-Minerals (CENTRUM SILVER 50+WOMEN PO) Take by mouth.   Yes [provider]  Omega-3 Fatty Acids (FISH OIL) 1000 MG CAPS Take by mouth.   Yes [provider]  Tiotropium Bromide Monohydrate (SPIRIVA RESPIMAT) 2.5 MCG/ACT AERS Inhale 2 puffs into the lungs every morning.   Yes [provider]  zinc gluconate 50 MG tablet Take 50 mg by mouth daily.   Yes [provider]    Allergies  Allergen Reactions  . Codeine Sulfate Other (See Comments)    "Sickness."  . Sulfa Antibiotics Other (See Comments)    It was a long time ago, cannot remember reaction.    Past Surgical History:  Procedure Laterality Date  . ABDOMINAL HYSTERECTOMY    . COLONOSCOPY N/A 03/03/2015   Procedure: COLONOSCOPY;  Surgeon: Lucilla Lame, MD;  Location: [facility];  Service: Gastroenterology;  Laterality: N/A;  . POLYPECTOMY  03/03/2015   Procedure: POLYPECTOMY INTESTINAL;  Surgeon: Lucilla Lame, MD;  Location: [facility];  Service: Gastroenterology;;    Social History  Substance Use Topics  . Smoking status: Former Research scientist (life sciences)  . Smokeless tobacco: Never Used  . Alcohol use No     Medication list has been reviewed and updated.  PHQ 2/9 Scores 06/11/2017  PHQ - 2 Score 0    Physical Exam  Constitutional: She is oriented to person, place, and time. She  appears well-developed. She appears distressed (moves slowly onto exam table due to discomfort).  HENT:  Head: Normocephalic and atraumatic.  Pulmonary/Chest: Effort normal and breath sounds normal. No respiratory distress. She has no wheezes. She has no rales.    Musculoskeletal: Normal range of motion.  Neurological: She is alert and oriented to person, place, and time.  Skin: Skin is warm, dry and intact. No rash noted.  Psychiatric: She has a normal mood and affect. Her  behavior is normal. Thought content normal.  Nursing note and vitals reviewed.   BP 122/64   Pulse 72   Ht 5\' 3"  (1.6 m)   Wt 139 lb (63 kg)   SpO2 97%   BMI 24.62 kg/m   Assessment and Plan: 1. Muscle strain of chest wall, initial encounter Alternate tylenol with ibuprofen Use ice or heat Abdominal support when active - cyclobenzaprine (FLEXERIL) 10 MG tablet; Take 1 tablet (10 mg total) by mouth 3 (three) times daily as needed for muscle spasms.  Dispense: 30 tablet; Refill: 0   Meds ordered this encounter  Medications  . cyclobenzaprine (FLEXERIL) 10 MG tablet    Sig: Take 1 tablet (10 mg total) by mouth 3 (three) times daily as needed for muscle spasms.    Dispense:  30 tablet    Refill:  0    Partially dictated using Editor, commissioning. Any errors are unintentional.  Halina Maidens, MD [facility]  06/11/2017

## 2017-06-11 NOTE — Patient Instructions (Signed)
Ibuprofen 200mg  - take 3 every 6 hours  (example 6 AM, 12 N, 6 PM and 12 MN)  Alternate with  Tylenol 500 mg - take 2 every 6 hours  (example 9 AM, 3 PM, 9 AM and then 3 AM if needed)

## 2017-07-10 ENCOUNTER — Encounter: Payer: Self-pay | Admitting: Internal Medicine

## 2017-07-10 ENCOUNTER — Ambulatory Visit (INDEPENDENT_AMBULATORY_CARE_PROVIDER_SITE_OTHER): Payer: BLUE CROSS/BLUE SHIELD | Admitting: Internal Medicine

## 2017-07-10 VITALS — BP 128/68 | HR 80 | Ht 63.0 in | Wt 137.0 lb

## 2017-07-10 DIAGNOSIS — Z23 Encounter for immunization: Secondary | ICD-10-CM | POA: Diagnosis not present

## 2017-07-10 DIAGNOSIS — J449 Chronic obstructive pulmonary disease, unspecified: Secondary | ICD-10-CM

## 2017-07-10 DIAGNOSIS — E785 Hyperlipidemia, unspecified: Secondary | ICD-10-CM | POA: Diagnosis not present

## 2017-07-10 NOTE — Progress Notes (Signed)
Date:  07/10/2017   Name:  Eileen Mills   DOB:  12-22-1950   MRN:  [id number]   Chief Complaint: Hypertension; Hyperlipidemia; and Immunizations (Flu Shot)  Hyperlipidemia  This is a chronic problem. The problem is uncontrolled. Associated symptoms include shortness of breath. Pertinent negatives include no chest pain or myalgias. She is currently on no antihyperlipidemic treatment.  Hypercalcemia - noted on labs last visit. Mildly elevated. She takes no calcium supplements. Does not drink excessive amounts of milk or eat cheese. She denies muscle spasms and cramps or weakness. COPD - followed by Eileen Mills. She is on Spiriva daily. She uses albuterol inhaler as needed, mostly before bed. No recent chest infections sputum production and increased wheezing or shortness of breath.  Lab Results  Component Value Date   CHOL 243 (H) 01/07/2017   HDL 64 01/07/2017   LDLCALC 135 (H) 01/07/2017   TRIG 220 (H) 01/07/2017   CHOLHDL 3.8 01/07/2017     Review of Systems  Constitutional: Negative for chills, fatigue and fever.  Eyes: Negative for visual disturbance.  Respiratory: Positive for shortness of breath. Negative for chest tightness and wheezing.   Cardiovascular: Negative for chest pain, palpitations and leg swelling.  Musculoskeletal: Negative for arthralgias and myalgias.  Neurological: Negative for tremors, weakness and headaches.    Patient Active Problem List   Diagnosis Date Noted  . Hypercalcemia 07/10/2017  . Stage 2 moderate COPD by GOLD classification ([place]) 01/07/2017  . Ganglion cyst of wrist 07/02/2015  . Intermittent palpitations 07/02/2015  . Degenerative arthritis of spine with cord compression 07/02/2015  . Abdominal bloating 07/02/2015  . Baker's cyst of knee 07/02/2015  . Hyperlipidemia, mild 07/02/2015    Prior to Admission medications   Medication Sig Start Date End Date Taking? Authorizing Provider  albuterol (PROVENTIL HFA;VENTOLIN HFA)  108 (90 Base) MCG/ACT inhaler Inhale 2 puffs into the lungs every 6 (six) hours as needed for wheezing or shortness of breath. 01/17/16  Yes Eileen Mills  Ascorbic Acid (VITAMIN C) 1000 MG tablet Take 1,000 mg by mouth daily.   Yes Provider, Historical, Mills  Cholecalciferol (D3-50) 50000 units capsule Take 5,000 Units by mouth daily.   Yes Provider, Historical, Mills  cyclobenzaprine (FLEXERIL) 10 MG tablet Take 1 tablet (10 mg total) by mouth 3 (three) times daily as needed for muscle spasms. 06/11/17  Yes Eileen Mills  Multiple Vitamins-Minerals (CENTRUM SILVER 50+WOMEN PO) Take by mouth.   Yes Provider, Historical, Mills  Omega-3 Fatty Acids (FISH OIL) 1000 MG CAPS Take by mouth.   Yes Provider, Historical, Mills  Tiotropium Bromide Monohydrate (SPIRIVA RESPIMAT) 2.5 MCG/ACT AERS Inhale 2 puffs into the lungs every morning.   Yes Provider, Historical, Mills  zinc gluconate 50 MG tablet Take 50 mg by mouth daily.   Yes Provider, Historical, Mills    Allergies  Allergen Reactions  . Codeine Sulfate Other (See Comments)    "Sickness."  . Sulfa Antibiotics Other (See Comments)    It was a long time ago, cannot remember reaction.    Past Surgical History:  Procedure Laterality Date  . ABDOMINAL HYSTERECTOMY    . COLONOSCOPY N/A 03/03/2015   Procedure: COLONOSCOPY;  Surgeon: Eileen Lame, Mills;  Location: [facility];  Service: Gastroenterology;  Laterality: N/A;  . POLYPECTOMY  03/03/2015   Procedure: POLYPECTOMY INTESTINAL;  Surgeon: Eileen Lame, Mills;  Location: [facility];  Service: Gastroenterology;;    Social History  Substance Use Topics  .  Smoking status: Former Research scientist (life sciences)  . Smokeless tobacco: Never Used  . Alcohol use No     Medication list has been reviewed and updated.  PHQ 2/9 Scores 06/11/2017  PHQ - 2 Score 0    Physical Exam  Constitutional: She is oriented to person, place, and time. She appears well-developed. No distress.  HENT:  Head: Normocephalic and  atraumatic.  Neck: Normal range of motion. Neck supple. Carotid bruit is not present.  Cardiovascular: Normal rate, regular rhythm and normal heart sounds.   Pulmonary/Chest: Effort normal. No respiratory distress. She has no wheezes. She has no rales.  Musculoskeletal: She exhibits no edema.  Neurological: She is alert and oriented to person, place, and time.  Skin: Skin is warm and dry. No rash noted.  Psychiatric: She has a normal mood and affect. Her speech is normal and behavior is normal. Thought content normal.  Nursing note and vitals reviewed.   BP 128/68 (BP Location: Right Arm, Patient Position: Sitting, Cuff Size: Normal)   Pulse 80   Ht 5\' 3"  (1.6 m)   Wt 137 lb (62.1 kg)   SpO2 97%   BMI 24.27 kg/m   Assessment and Plan: 1. Hyperlipidemia, mild Recheck and advise on medications - Lipid panel  2. Hypercalcemia Recheck labs before further testing - Comprehensive metabolic panel  3. Stage 2 moderate COPD by GOLD classification ([place]) controlled  4. Need for influenza vaccination - Flu Vaccine QUAD 36+ mos IM   No orders of the defined types were placed in this encounter.   Partially dictated using Editor, commissioning. Any errors are unintentional.  Eileen Maidens, [facility]  07/10/2017

## 2017-07-11 LAB — COMPREHENSIVE METABOLIC PANEL
ALBUMIN: 4.7 g/dL (ref 3.6–4.8)
ALK PHOS: 110 IU/L (ref 39–117)
ALT: 26 IU/L (ref 0–32)
AST: 26 IU/L (ref 0–40)
Albumin/Globulin Ratio: 2 (ref 1.2–2.2)
BILIRUBIN TOTAL: 0.2 mg/dL (ref 0.0–1.2)
BUN / CREAT RATIO: 9 — AB (ref 12–28)
BUN: 8 mg/dL (ref 8–27)
CHLORIDE: 99 mmol/L (ref 96–106)
CO2: 24 mmol/L (ref 20–29)
Calcium: 10.3 mg/dL (ref 8.7–10.3)
Creatinine, Ser: 0.89 mg/dL (ref 0.57–1.00)
GFR calc Af Amer: 79 mL/min/{1.73_m2} (ref >59)
GFR calc non Af Amer: 69 mL/min/{1.73_m2} (ref >59)
GLOBULIN, TOTAL: 2.3 g/dL (ref 1.5–4.5)
GLUCOSE: 86 mg/dL (ref 65–99)
Potassium: 4.7 mmol/L (ref 3.5–5.2)
SODIUM: 140 mmol/L (ref 134–144)
Total Protein: 7 g/dL (ref 6.0–8.5)

## 2017-07-11 LAB — LIPID PANEL
Chol/HDL Ratio: 3.4 ratio (ref 0.0–4.4)
Cholesterol, Total: 220 mg/dL — ABNORMAL HIGH (ref 100–199)
HDL: 64 mg/dL (ref >39)
LDL Calculated: 110 mg/dL — ABNORMAL HIGH (ref 0–99)
Triglycerides: 228 mg/dL — ABNORMAL HIGH (ref 0–149)
VLDL CHOLESTEROL CAL: 46 mg/dL — AB (ref 5–40)

## 2017-11-12 ENCOUNTER — Encounter: Payer: Self-pay | Admitting: Internal Medicine

## 2017-11-12 ENCOUNTER — Ambulatory Visit: Payer: BLUE CROSS/BLUE SHIELD | Admitting: Internal Medicine

## 2017-11-12 VITALS — BP 114/68 | HR 94 | Temp 99.1°F | Ht 63.0 in | Wt 137.0 lb

## 2017-11-12 DIAGNOSIS — J441 Chronic obstructive pulmonary disease with (acute) exacerbation: Secondary | ICD-10-CM

## 2017-11-12 MED ORDER — GUAIFENESIN-CODEINE 100-10 MG/5ML PO SYRP: 5.0000 mL | ORAL_SOLUTION | Freq: Three times a day (TID) | ORAL | 0 refills | Status: DC | PRN

## 2017-11-12 MED ORDER — AZITHROMYCIN 250 MG PO TABS: ORAL_TABLET | ORAL | 0 refills | Status: AC

## 2017-11-12 NOTE — Progress Notes (Signed)
Date:  11/12/2017   Name:  Eileen Mills   DOB:  09-26-1951   MRN:  [id number]   Chief Complaint: Cough (X 2 days. More mucous than normal. Chills. Throat is irriated from coughing. Colorful mucous. No headache. )  URI   This is a new problem. The current episode started in the past 7 days. The problem has been gradually worsening. There has been no fever (low grade). Associated symptoms include congestion, coughing, rhinorrhea and wheezing. Pertinent negatives include no vomiting.  She has mild COPD and is producing green sputum.  She has Spiriva which she uses daily.  She also has albuterol MDI but does not use it regularly.  She is not on any other maintenance medication.   Review of Systems  HENT: Positive for congestion and rhinorrhea.   Respiratory: Positive for cough and wheezing.   Gastrointestinal: Negative for vomiting.    Patient Active Problem List   Diagnosis Date Noted  . Hypercalcemia 07/10/2017  . Stage 2 moderate COPD by GOLD classification ([place]) 01/07/2017  . Ganglion cyst of wrist 07/02/2015  . Intermittent palpitations 07/02/2015  . Degenerative arthritis of spine with cord compression 07/02/2015  . Abdominal bloating 07/02/2015  . Baker's cyst of knee 07/02/2015  . Hyperlipidemia, mild 07/02/2015    Prior to Admission medications   Medication Sig Start Date End Date Taking? Authorizing Provider  albuterol (PROVENTIL HFA;VENTOLIN HFA) 108 (90 Base) MCG/ACT inhaler Inhale 2 puffs into the lungs every 6 (six) hours as needed for wheezing or shortness of breath. 01/17/16  Yes Triplett, Cari B, FNP  Ascorbic Acid (VITAMIN C) 1000 MG tablet Take 1,000 mg by mouth daily.   Yes [provider]  Cholecalciferol (D3-50) 50000 units capsule Take 5,000 Units by mouth daily.   Yes [provider]  cyclobenzaprine (FLEXERIL) 10 MG tablet Take 1 tablet (10 mg total) by mouth 3 (three) times daily as needed for muscle spasms. 06/11/17  Yes  Glean Hess, MD  Multiple Vitamins-Minerals (CENTRUM SILVER 50+WOMEN PO) Take by mouth.   Yes [provider]  Omega-3 Fatty Acids (FISH OIL) 1000 MG CAPS Take by mouth.   Yes [provider]  Tiotropium Bromide Monohydrate (SPIRIVA RESPIMAT) 2.5 MCG/ACT AERS Inhale 2 puffs into the lungs every morning.   Yes [provider]  zinc gluconate 50 MG tablet Take 50 mg by mouth daily.   Yes [provider]    Allergies  Allergen Reactions  . Codeine Sulfate Other (See Comments)    "Sickness."  . Sulfa Antibiotics Other (See Comments)    It was a long time ago, cannot remember reaction.    Past Surgical History:  Procedure Laterality Date  . ABDOMINAL HYSTERECTOMY    . COLONOSCOPY N/A 03/03/2015   Procedure: COLONOSCOPY;  Surgeon: Lucilla Lame, MD;  Location: [facility];  Service: Gastroenterology;  Laterality: N/A;  . POLYPECTOMY  03/03/2015   Procedure: POLYPECTOMY INTESTINAL;  Surgeon: Lucilla Lame, MD;  Location: [facility];  Service: Gastroenterology;;    Social History   Tobacco Use  . Smoking status: Former Research scientist (life sciences)  . Smokeless tobacco: Never Used  Substance Use Topics  . Alcohol use: No    Alcohol/week: 0.0 oz  . Drug use: No     Medication list has been reviewed and updated.  PHQ 2/9 Scores 06/11/2017  PHQ - 2 Score 0    Physical Exam  Constitutional: She is oriented to person, place, and time. She appears well-developed. No  distress.  HENT:  Head: Normocephalic and atraumatic.  Neck: Normal range of motion. Neck supple.  Cardiovascular: Normal rate, regular rhythm and normal heart sounds.  Pulmonary/Chest: Effort normal. No respiratory distress. She has no wheezes.  RUL rhonchi posteriorly  Musculoskeletal: Normal range of motion. She exhibits no edema.  Neurological: She is alert and oriented to person, place, and time.  Skin: Skin is warm and dry. No rash noted.  Psychiatric: She has a normal mood and  affect. Her behavior is normal. Thought content normal.  Nursing note and vitals reviewed.   BP 114/68   Pulse 94   Temp 99.1 F (37.3 C)   Ht 5\' 3"  (1.6 m)   Wt 137 lb (62.1 kg)   SpO2 92%   BMI 24.27 kg/m   Assessment and Plan: 1. COPD exacerbation (HCC) Continue spiriva daily Use albuterol MDI every 4-6 hours PRN - azithromycin (ZITHROMAX Z-PAK) 250 MG tablet; UAD  Dispense: 6 each; Refill: 0 - guaiFENesin-codeine (ROBITUSSIN AC) 100-10 MG/5ML syrup; Take 5 mLs by mouth 3 (three) times daily as needed for cough.  Dispense: 150 mL; Refill: 0   Meds ordered this encounter  Medications  . azithromycin (ZITHROMAX Z-PAK) 250 MG tablet    Sig: UAD    Dispense:  6 each    Refill:  0  . guaiFENesin-codeine (ROBITUSSIN AC) 100-10 MG/5ML syrup    Sig: Take 5 mLs by mouth 3 (three) times daily as needed for cough.    Dispense:  150 mL    Refill:  0    Partially dictated using Editor, commissioning. Any errors are unintentional.  Halina Maidens, MD [facility]  11/12/2017

## 2017-12-24 ENCOUNTER — Other Ambulatory Visit: Payer: Self-pay | Admitting: Internal Medicine

## 2017-12-24 DIAGNOSIS — Z1231 Encounter for screening mammogram for malignant neoplasm of breast: Secondary | ICD-10-CM

## 2018-01-13 ENCOUNTER — Encounter: Payer: Self-pay | Admitting: Internal Medicine

## 2018-01-13 ENCOUNTER — Ambulatory Visit (INDEPENDENT_AMBULATORY_CARE_PROVIDER_SITE_OTHER): Payer: BLUE CROSS/BLUE SHIELD | Admitting: Internal Medicine

## 2018-01-13 ENCOUNTER — Telehealth: Payer: Self-pay | Admitting: *Deleted

## 2018-01-13 VITALS — BP 118/62 | HR 71 | Ht 63.0 in | Wt 138.0 lb

## 2018-01-13 DIAGNOSIS — J449 Chronic obstructive pulmonary disease, unspecified: Secondary | ICD-10-CM | POA: Diagnosis not present

## 2018-01-13 DIAGNOSIS — Z122 Encounter for screening for malignant neoplasm of respiratory organs: Secondary | ICD-10-CM

## 2018-01-13 DIAGNOSIS — Z0000 Encounter for general adult medical examination without abnormal findings: Secondary | ICD-10-CM

## 2018-01-13 DIAGNOSIS — Z1239 Encounter for other screening for malignant neoplasm of breast: Secondary | ICD-10-CM

## 2018-01-13 DIAGNOSIS — Z1231 Encounter for screening mammogram for malignant neoplasm of breast: Secondary | ICD-10-CM | POA: Diagnosis not present

## 2018-01-13 DIAGNOSIS — E785 Hyperlipidemia, unspecified: Secondary | ICD-10-CM | POA: Diagnosis not present

## 2018-01-13 DIAGNOSIS — Z87891 Personal history of nicotine dependence: Secondary | ICD-10-CM

## 2018-01-13 LAB — POCT URINALYSIS DIPSTICK
Bilirubin, UA: NEGATIVE
Blood, UA: NEGATIVE
GLUCOSE UA: NEGATIVE
Ketones, UA: NEGATIVE
LEUKOCYTES UA: NEGATIVE
Nitrite, UA: POSITIVE
Protein, UA: NEGATIVE
SPEC GRAV UA: 1.02 (ref 1.010–1.025)
Urobilinogen, UA: 0.2 E.U./dL
pH, UA: 6 (ref 5.0–8.0)

## 2018-01-13 NOTE — Telephone Encounter (Signed)
Received referral for initial lung cancer screening scan. Contacted patient and obtained smoking history,(former, quit 2012, 45 pack year) as well as answering questions related to screening process. Patient denies signs of lung cancer such as weight loss or hemoptysis. Patient denies comorbidity that would prevent curative treatment if lung cancer were found. Patient is scheduled for shared decision making visit and CT scan on 01/27/18.

## 2018-01-13 NOTE — Patient Instructions (Signed)

## 2018-01-13 NOTE — Progress Notes (Signed)
Date:  01/13/2018   Name:  Eileen Mills   DOB:  1951/07/05   MRN:  [id number]   Chief Complaint: Annual Exam (Breast Exam. Fasting for labs.) Eileen Mills is a 67 y.o. female who presents today for her Complete Annual Exam. She feels well. She reports exercising walking. She reports she is sleeping fairly well. Mammogram is scheduled for May.  Colonoscopy was done in 2016.  COPD - recovered from her URI in February.  She is breathing well.  She is interested in the low dose chest CT screening.  Review of Systems  Constitutional: Negative for chills, fatigue and fever.  HENT: Positive for hearing loss. Negative for congestion, tinnitus, trouble swallowing and voice change.   Eyes: Negative for visual disturbance.  Respiratory: Positive for shortness of breath and wheezing. Negative for cough and chest tightness.   Cardiovascular: Negative for chest pain, palpitations and leg swelling.  Gastrointestinal: Negative for abdominal pain, constipation, diarrhea and vomiting.  Endocrine: Negative for polydipsia and polyuria.  Genitourinary: Negative for dysuria, frequency, genital sores, vaginal bleeding and vaginal discharge.  Musculoskeletal: Negative for arthralgias, gait problem and joint swelling.  Skin: Negative for color change and rash.  Allergic/Immunologic: Negative for environmental allergies.  Neurological: Negative for dizziness, tremors, light-headedness and headaches.  Hematological: Negative for adenopathy. Does not bruise/bleed easily.  Psychiatric/Behavioral: Negative for dysphoric mood and sleep disturbance. The patient is not nervous/anxious.     Patient Active Problem List   Diagnosis Date Noted  . Hypercalcemia 07/10/2017  . Stage 2 moderate COPD by GOLD classification ([place]) 01/07/2017  . Ganglion cyst of wrist 07/02/2015  . Intermittent palpitations 07/02/2015  . Degenerative arthritis of spine with cord compression 07/02/2015  . Abdominal  bloating 07/02/2015  . Baker's cyst of knee 07/02/2015  . Hyperlipidemia, mild 07/02/2015    Prior to Admission medications   Medication Sig Start Date End Date Taking? Authorizing Provider  albuterol (PROVENTIL HFA;VENTOLIN HFA) 108 (90 Base) MCG/ACT inhaler Inhale 2 puffs into the lungs every 6 (six) hours as needed for wheezing or shortness of breath. 01/17/16   Triplett, Cari B, FNP  Ascorbic Acid (VITAMIN C) 1000 MG tablet Take 1,000 mg by mouth daily.    [provider]  Cholecalciferol (D3-50) 50000 units capsule Take 5,000 Units by mouth daily.    [provider]  cyclobenzaprine (FLEXERIL) 10 MG tablet Take 1 tablet (10 mg total) by mouth 3 (three) times daily as needed for muscle spasms. 06/11/17   Glean Hess, MD  Multiple Vitamins-Minerals (CENTRUM SILVER 50+WOMEN PO) Take by mouth.    [provider]  Omega-3 Fatty Acids (FISH OIL) 1000 MG CAPS Take by mouth.    [provider]  Tiotropium Bromide Monohydrate (SPIRIVA RESPIMAT) 2.5 MCG/ACT AERS Inhale 2 puffs into the lungs every morning.    [provider]  zinc gluconate 50 MG tablet Take 50 mg by mouth daily.    [provider]    Allergies  Allergen Reactions  . Sulfa Antibiotics Other (See Comments)    It was a long time ago, cannot remember reaction.    Past Surgical History:  Procedure Laterality Date  . ABDOMINAL HYSTERECTOMY    . COLONOSCOPY N/A 03/03/2015   Procedure: COLONOSCOPY;  Surgeon: Lucilla Lame, MD;  Location: [place];  Service: Gastroenterology;  Laterality: N/A;  . POLYPECTOMY  03/03/2015   Procedure: POLYPECTOMY INTESTINAL;  Surgeon: Lucilla Lame, MD;  Location: [place];  Service: Gastroenterology;;  Social History   Tobacco Use  . Smoking status: Former Smoker    Packs/day: 1.50    Years: 30.00    Pack years: 45.00    Types: Cigarettes  . Smokeless tobacco: Never Used  Substance Use Topics  . Alcohol use: No     Alcohol/week: 0.0 oz  . Drug use: No     Medication list has been reviewed and updated.  PHQ 2/9 Scores 06/11/2017  PHQ - 2 Score 0    Physical Exam  Constitutional: She is oriented to person, place, and time. She appears well-developed and well-nourished. No distress.  HENT:  Head: Normocephalic and atraumatic.  Right Ear: Tympanic membrane and ear canal normal.  Left Ear: Tympanic membrane and ear canal normal.  Nose: Right sinus exhibits no maxillary sinus tenderness. Left sinus exhibits no maxillary sinus tenderness.  Mouth/Throat: Uvula is midline and oropharynx is clear and moist.  Eyes: Conjunctivae and EOM are normal. Right eye exhibits no discharge. Left eye exhibits no discharge. No scleral icterus.  Neck: Normal range of motion. Carotid bruit is not present. No erythema present. No thyromegaly present.  Cardiovascular: Normal rate, regular rhythm, normal heart sounds and normal pulses.  Pulmonary/Chest: Effort normal. No respiratory distress. She has no wheezes. Right breast exhibits no mass, no nipple discharge, no skin change and no tenderness. Left breast exhibits no mass, no nipple discharge, no skin change and no tenderness.  Abdominal: Soft. Bowel sounds are normal. There is no hepatosplenomegaly. There is no tenderness. There is no CVA tenderness.  Musculoskeletal: Normal range of motion.  Lymphadenopathy:    She has no cervical adenopathy.    She has no axillary adenopathy.  Neurological: She is alert and oriented to person, place, and time. She has normal reflexes. No cranial nerve deficit or sensory deficit.  Skin: Skin is warm, dry and intact. No rash noted.  Psychiatric: She has a normal mood and affect. Her speech is normal and behavior is normal. Thought content normal.  Nursing note and vitals reviewed.   BP 118/62   Pulse 71   Ht 5\' 3"  (1.6 m)   Wt 138 lb (62.6 kg)   SpO2 97%   BMI 24.45 kg/m   Assessment and Plan: 1. Annual physical exam Increase  fluid intake due to asx bactiuria - CBC with Differential/Platelet - Comprehensive metabolic panel - Lipid panel - TSH - POCT urinalysis dipstick  2. Breast cancer screening Mammogram scheduled  3. Stage 2 moderate COPD by GOLD classification ([place]) Continue Spiriva Message sent regarding low dose screening chest CT  4. Hyperlipidemia, mild Check labs and advise   No orders of the defined types were placed in this encounter.   Partially dictated using Editor, commissioning. Any errors are unintentional.  Halina Maidens, MD [facility]  01/13/2018

## 2018-01-14 LAB — LIPID PANEL
CHOL/HDL RATIO: 3.7 ratio (ref 0.0–4.4)
Cholesterol, Total: 257 mg/dL — ABNORMAL HIGH (ref 100–199)
HDL: 70 mg/dL (ref >39)
LDL Calculated: 149 mg/dL — ABNORMAL HIGH (ref 0–99)
TRIGLYCERIDES: 188 mg/dL — AB (ref 0–149)
VLDL Cholesterol Cal: 38 mg/dL (ref 5–40)

## 2018-01-14 LAB — COMPREHENSIVE METABOLIC PANEL
ALBUMIN: 4.6 g/dL (ref 3.6–4.8)
ALT: 34 IU/L — ABNORMAL HIGH (ref 0–32)
AST: 21 IU/L (ref 0–40)
Albumin/Globulin Ratio: 1.9 (ref 1.2–2.2)
Alkaline Phosphatase: 99 IU/L (ref 39–117)
BUN / CREAT RATIO: 10 — AB (ref 12–28)
BUN: 8 mg/dL (ref 8–27)
Bilirubin Total: 0.3 mg/dL (ref 0.0–1.2)
CALCIUM: 10.2 mg/dL (ref 8.7–10.3)
CO2: 25 mmol/L (ref 20–29)
CREATININE: 0.79 mg/dL (ref 0.57–1.00)
Chloride: 101 mmol/L (ref 96–106)
GFR calc Af Amer: 91 mL/min/{1.73_m2} (ref >59)
GFR, EST NON AFRICAN AMERICAN: 79 mL/min/{1.73_m2} (ref >59)
GLOBULIN, TOTAL: 2.4 g/dL (ref 1.5–4.5)
Glucose: 85 mg/dL (ref 65–99)
Potassium: 4.6 mmol/L (ref 3.5–5.2)
SODIUM: 143 mmol/L (ref 134–144)
TOTAL PROTEIN: 7 g/dL (ref 6.0–8.5)

## 2018-01-14 LAB — CBC WITH DIFFERENTIAL/PLATELET
Basophils Absolute: 0 10*3/uL (ref 0.0–0.2)
Basos: 1 %
EOS (ABSOLUTE): 0.1 10*3/uL (ref 0.0–0.4)
EOS: 2 %
HEMATOCRIT: 41.4 % (ref 34.0–46.6)
HEMOGLOBIN: 13.4 g/dL (ref 11.1–15.9)
IMMATURE GRANULOCYTES: 0 %
Immature Grans (Abs): 0 10*3/uL (ref 0.0–0.1)
Lymphocytes Absolute: 2.6 10*3/uL (ref 0.7–3.1)
Lymphs: 33 %
MCH: 30 pg (ref 26.6–33.0)
MCHC: 32.4 g/dL (ref 31.5–35.7)
MCV: 93 fL (ref 79–97)
MONOCYTES: 6 %
Monocytes Absolute: 0.5 10*3/uL (ref 0.1–0.9)
NEUTROS PCT: 58 %
Neutrophils Absolute: 4.8 10*3/uL (ref 1.4–7.0)
Platelets: 362 10*3/uL (ref 150–379)
RBC: 4.47 x10E6/uL (ref 3.77–5.28)
RDW: 14.5 % (ref 12.3–15.4)
WBC: 8.1 10*3/uL (ref 3.4–10.8)

## 2018-01-14 LAB — TSH: TSH: 1.71 u[IU]/mL (ref 0.450–4.500)

## 2018-01-27 ENCOUNTER — Ambulatory Visit
Admission: RE | Admit: 2018-01-27 | Discharge: 2018-01-27 | Disposition: A | Discharge status: Home / Self Care | Payer: BLUE CROSS/BLUE SHIELD | Source: Ambulatory Visit | Attending: Oncology | Admitting: Oncology

## 2018-01-27 ENCOUNTER — Inpatient Hospital Stay: Payer: BLUE CROSS/BLUE SHIELD | Attending: Oncology | Admitting: Oncology

## 2018-01-27 DIAGNOSIS — Z122 Encounter for screening for malignant neoplasm of respiratory organs: Secondary | ICD-10-CM | POA: Insufficient documentation

## 2018-01-27 DIAGNOSIS — J439 Emphysema, unspecified: Secondary | ICD-10-CM | POA: Insufficient documentation

## 2018-01-27 DIAGNOSIS — Z87891 Personal history of nicotine dependence: Secondary | ICD-10-CM | POA: Diagnosis present

## 2018-01-27 NOTE — Progress Notes (Signed)
In accordance with CMS guidelines, patient has met eligibility criteria including age, absence of signs or symptoms of lung cancer.  Social History   Tobacco Use  . Smoking status: Former Smoker    Packs/day: 1.50    Years: 30.00    Pack years: 45.00    Types: Cigarettes    Last attempt to quit: 2012    Years since quitting: 7.3  . Smokeless tobacco: Never Used  Substance Use Topics  . Alcohol use: No    Alcohol/week: 0.0 oz  . Drug use: No     A shared decision-making session was conducted prior to the performance of CT scan. This includes one or more decision aids, includes benefits and harms of screening, follow-up diagnostic testing, over-diagnosis, false positive rate, and total radiation exposure.  Counseling on the importance of adherence to annual lung cancer LDCT screening, impact of co-morbidities, and ability or willingness to undergo diagnosis and treatment is imperative for compliance of the program.  Counseling on the importance of continued smoking cessation for former smokers; the importance of smoking cessation for current smokers, and information about tobacco cessation interventions have been given to patient including [place] and 1800 quit [place] programs.  Written order for lung cancer screening with LDCT has been given to the patient and any and all questions have been answered to the best of my abilities.   Yearly follow up will be coordinated by Burgess Estelle, Thoracic Navigator.  Faythe Casa, NP 01/27/2018 1:22 PM

## 2018-01-28 ENCOUNTER — Ambulatory Visit
Admission: RE | Admit: 2018-01-28 | Discharge: 2018-01-28 | Disposition: A | Discharge status: Home / Self Care | Payer: BLUE CROSS/BLUE SHIELD | Source: Ambulatory Visit | Attending: Internal Medicine | Admitting: Internal Medicine

## 2018-01-28 DIAGNOSIS — Z1231 Encounter for screening mammogram for malignant neoplasm of breast: Secondary | ICD-10-CM

## 2018-02-02 ENCOUNTER — Telehealth: Payer: Self-pay | Admitting: *Deleted

## 2018-02-02 NOTE — Telephone Encounter (Signed)
Notified patient of LDCT lung cancer screening program results with recommendation for 12 month follow up imaging. Also notified of incidental findings noted below and is encouraged to discuss further with PCP who will receive a copy of this note and/or the CT report. Patient verbalizes understanding.  ° °IMPRESSION: °1. Lung-RADS 1, negative. Continue annual screening with low-dose °chest CT without contrast in 12 months. °2.  Emphysema (ICD10-J43.9). °  °

## 2018-07-23 DIAGNOSIS — J449 Chronic obstructive pulmonary disease, unspecified: Secondary | ICD-10-CM | POA: Diagnosis not present

## 2018-10-28 ENCOUNTER — Other Ambulatory Visit: Payer: Self-pay

## 2018-10-28 ENCOUNTER — Telehealth: Payer: Self-pay | Admitting: Internal Medicine

## 2018-10-28 ENCOUNTER — Emergency Department
Admission: EM | Admit: 2018-10-28 | Discharge: 2018-10-28 | Disposition: A | Discharge status: Home / Self Care | Payer: PPO | Attending: Emergency Medicine | Admitting: Emergency Medicine

## 2018-10-28 ENCOUNTER — Encounter: Payer: Self-pay | Admitting: Emergency Medicine

## 2018-10-28 ENCOUNTER — Emergency Department: Payer: PPO

## 2018-10-28 DIAGNOSIS — R1030 Lower abdominal pain, unspecified: Secondary | ICD-10-CM | POA: Insufficient documentation

## 2018-10-28 DIAGNOSIS — J449 Chronic obstructive pulmonary disease, unspecified: Secondary | ICD-10-CM | POA: Diagnosis not present

## 2018-10-28 DIAGNOSIS — N281 Cyst of kidney, acquired: Secondary | ICD-10-CM | POA: Diagnosis not present

## 2018-10-28 DIAGNOSIS — K625 Hemorrhage of anus and rectum: Secondary | ICD-10-CM

## 2018-10-28 DIAGNOSIS — Z87891 Personal history of nicotine dependence: Secondary | ICD-10-CM | POA: Diagnosis not present

## 2018-10-28 HISTORY — DX: Chronic obstructive pulmonary disease, unspecified: J44.9

## 2018-10-28 LAB — URINALYSIS, COMPLETE (UACMP) WITH MICROSCOPIC
BILIRUBIN URINE: NEGATIVE
Glucose, UA: NEGATIVE mg/dL
Hgb urine dipstick: NEGATIVE
Ketones, ur: NEGATIVE mg/dL
Leukocytes, UA: NEGATIVE
NITRITE: NEGATIVE
Protein, ur: NEGATIVE mg/dL
Specific Gravity, Urine: 1.005 (ref 1.005–1.030)
pH: 6 (ref 5.0–8.0)

## 2018-10-28 LAB — CBC
HCT: 41.9 % (ref 36.0–46.0)
Hemoglobin: 13.7 g/dL (ref 12.0–15.0)
MCH: 30.1 pg (ref 26.0–34.0)
MCHC: 32.7 g/dL (ref 30.0–36.0)
MCV: 92.1 fL (ref 80.0–100.0)
Platelets: 319 10*3/uL (ref 150–400)
RBC: 4.55 MIL/uL (ref 3.87–5.11)
RDW: 13.5 % (ref 11.5–15.5)
WBC: 7.2 10*3/uL (ref 4.0–10.5)
nRBC: 0 % (ref 0.0–0.2)

## 2018-10-28 LAB — LIPASE, BLOOD: LIPASE: 24 U/L (ref 11–51)

## 2018-10-28 LAB — COMPREHENSIVE METABOLIC PANEL
ALT: 33 U/L (ref 0–44)
AST: 24 U/L (ref 15–41)
Albumin: 4.5 g/dL (ref 3.5–5.0)
Alkaline Phosphatase: 77 U/L (ref 38–126)
Anion gap: 10 (ref 5–15)
BUN: 8 mg/dL (ref 8–23)
CHLORIDE: 104 mmol/L (ref 98–111)
CO2: 24 mmol/L (ref 22–32)
CREATININE: 0.66 mg/dL (ref 0.44–1.00)
Calcium: 9 mg/dL (ref 8.9–10.3)
GFR calc Af Amer: >60 mL/min (ref >60)
GFR calc non Af Amer: >60 mL/min (ref >60)
Glucose, Bld: 90 mg/dL (ref 70–99)
POTASSIUM: 3.8 mmol/L (ref 3.5–5.1)
Sodium: 138 mmol/L (ref 135–145)
Total Bilirubin: 0.5 mg/dL (ref 0.3–1.2)
Total Protein: 7.2 g/dL (ref 6.5–8.1)

## 2018-10-28 MED ORDER — SODIUM CHLORIDE 0.9% FLUSH: 3.0000 mL | Freq: Once | INTRAVENOUS | Status: DC

## 2018-10-28 MED ORDER — IOPAMIDOL (ISOVUE-300) INJECTION 61%
100.0000 mL | Freq: Once | INTRAVENOUS | Status: AC | PRN
  Administered 2018-10-28: 100 mL via INTRAVENOUS

## 2018-10-28 NOTE — ED Triage Notes (Signed)
PT c/o bright red blood in her stool xfew days. Pt states increased abd pain and foul odor with urine. VSS, NAD noted

## 2018-10-28 NOTE — ED Notes (Signed)
Esign not working at this time. Pt verbalized discharge instructions and has no questions at this time. 

## 2018-10-28 NOTE — ED Provider Notes (Signed)
[facility] Emergency Department Provider Note       Time seen: ----------------------------------------- 2:14 PM on 10/28/2018 -----------------------------------------   I have reviewed the triage vital signs and the nursing notes.  HISTORY   Chief Complaint Rectal Bleeding    HPI Eileen Mills is a 68 y.o. female with a history of COPD, hypercalcemia who presents to the ED for bright red blood per rectum for the past several days.  Patient states she passed approximately one fourth of a cup and that it has gradually improved.  She has reported left-sided abdominal pain with some foul odor in her urine.  She denies fevers, chills or other complaints.  Past Medical History:  Diagnosis Date  . COPD (chronic obstructive pulmonary disease) [facility])     Patient Active Problem List   Diagnosis Date Noted  . Hypercalcemia 07/10/2017  . Stage 2 moderate COPD by GOLD classification ([place]) 01/07/2017  . Ganglion cyst of wrist 07/02/2015  . Intermittent palpitations 07/02/2015  . Degenerative arthritis of spine with cord compression 07/02/2015  . Abdominal bloating 07/02/2015  . Baker's cyst of knee 07/02/2015  . Hyperlipidemia, mild 07/02/2015    Past Surgical History:  Procedure Laterality Date  . ABDOMINAL HYSTERECTOMY    . COLONOSCOPY N/A 03/03/2015   Procedure: COLONOSCOPY;  Surgeon: Lucilla Lame, MD;  Location: [place];  Service: Gastroenterology;  Laterality: N/A;  . POLYPECTOMY  03/03/2015   Procedure: POLYPECTOMY INTESTINAL;  Surgeon: Lucilla Lame, MD;  Location: [place];  Service: Gastroenterology;;    Allergies Sulfa antibiotics  Social History Social History   Tobacco Use  . Smoking status: Former Smoker    Packs/day: 1.50    Years: 30.00    Pack years: 45.00    Types: Cigarettes    Last attempt to quit: 2012    Years since quitting: 8.0  . Smokeless tobacco: Never Used  Substance Use Topics  . Alcohol  use: No    Alcohol/week: 0.0 standard drinks  . Drug use: No   Review of Systems Constitutional: Negative for fever. Cardiovascular: Negative for chest pain. Respiratory: Negative for shortness of breath. Gastrointestinal: Positive for abdominal pain, rectal bleeding Genitourinary: Positive for foul-smelling urine Musculoskeletal: Negative for back pain. Skin: Negative for rash. Neurological: Negative for headaches, focal weakness or numbness.  All systems negative/normal/unremarkable except as stated in the HPI  ____________________________________________   PHYSICAL EXAM:  VITAL SIGNS: ED Triage Vitals  Enc Vitals Group     BP 10/28/18 1046 (!) 143/87     Pulse Rate 10/28/18 1044 71     Resp 10/28/18 1044 16     Temp 10/28/18 1044 97.7 F (36.5 C)     Temp Source 10/28/18 1044 Oral     SpO2 10/28/18 1044 97 %     Weight --      Height --      Head Circumference --      Peak Flow --      Pain Score 10/28/18 1044 5     Pain Loc --      Pain Edu? --      Excl. in [place]? --    Constitutional: Alert and oriented. Well appearing and in no distress. Eyes: Conjunctivae are normal. Normal extraocular movements. ENT      Head: Normocephalic and atraumatic.      Nose: No congestion/rhinnorhea.      Mouth/Throat: Mucous membranes are moist.      Neck: No stridor. Cardiovascular: Normal rate, regular  rhythm. No murmurs, rubs, or gallops. Respiratory: Normal respiratory effort without tachypnea nor retractions. Breath sounds are clear and equal bilaterally. No wheezes/rales/rhonchi. Gastrointestinal: Soft and nontender. Normal bowel sounds Musculoskeletal: Nontender with normal range of motion in extremities. No lower extremity tenderness nor edema. Neurologic:  Normal speech and language. No gross focal neurologic deficits are appreciated.  Skin:  Skin is warm, dry and intact. No rash noted. Psychiatric: Mood and affect are normal. Speech and behavior are normal.   ____________________________________________  ED COURSE:  As part of my medical decision making, I reviewed the following data within the [place] History obtained from family if available, nursing notes, old chart and ekg, as well as notes from prior ED visits. Patient presented for abdominal pain with rectal bleeding, we will assess with labs and imaging as indicated at this time.   Procedures ____________________________________________   LABS (pertinent positives/negatives)  Labs Reviewed  URINALYSIS, COMPLETE (UACMP) WITH MICROSCOPIC - Abnormal; Notable for the following components:      Result Value   Color, Urine YELLOW (*)    APPearance CLEAR (*)    Bacteria, UA FEW (*)    All other components within normal limits  LIPASE, BLOOD  COMPREHENSIVE METABOLIC PANEL  CBC    RADIOLOGY Images were viewed by me  CT of the abdomen pelvis with contrast IMPRESSION: Mildly dilated/fluid distended small bowel loops of jejunum in the mid to lower abdomen with mucosal enhancement suggesting nonspecific small bowel enteritis. No associated obstruction pattern, fluid collection, abscess, or perforation.  Aortic atherosclerosis without aneurysm or occlusive process ____________________________________________   DIFFERENTIAL DIAGNOSIS   Diverticulosis, diverticulitis, hemorrhoidal bleeding  FINAL ASSESSMENT AND PLAN  Rectal bleeding   Plan: The patient had presented for rectal bleeding with some abdominal pain although she was not tender here. Patient's labs are reassuring. Patient's imaging revealed some nonspecific small bowel dilatation.  She has a benign examination with benign labs.  She will be cleared for outpatient GI follow-up.   Laurence Aly, MD    Note: This note was generated in part or whole with voice recognition software. Voice recognition is usually quite accurate but there are transcription errors that can and very often do occur.  I apologize for any typographical errors that were not detected and corrected.     Earleen Newport, MD 10/28/18 [phone]

## 2018-10-28 NOTE — ED Notes (Signed)
Patient transported to CT 

## 2018-11-02 DIAGNOSIS — D225 Melanocytic nevi of trunk: Secondary | ICD-10-CM | POA: Diagnosis not present

## 2018-11-02 DIAGNOSIS — D2262 Melanocytic nevi of left upper limb, including shoulder: Secondary | ICD-10-CM | POA: Diagnosis not present

## 2018-11-02 DIAGNOSIS — L309 Dermatitis, unspecified: Secondary | ICD-10-CM | POA: Diagnosis not present

## 2018-11-02 DIAGNOSIS — L821 Other seborrheic keratosis: Secondary | ICD-10-CM | POA: Diagnosis not present

## 2018-11-02 DIAGNOSIS — D2272 Melanocytic nevi of left lower limb, including hip: Secondary | ICD-10-CM | POA: Diagnosis not present

## 2018-11-02 DIAGNOSIS — D2261 Melanocytic nevi of right upper limb, including shoulder: Secondary | ICD-10-CM | POA: Diagnosis not present

## 2018-11-02 DIAGNOSIS — D2271 Melanocytic nevi of right lower limb, including hip: Secondary | ICD-10-CM | POA: Diagnosis not present

## 2018-11-08 ENCOUNTER — Emergency Department
Admission: EM | Admit: 2018-11-08 | Discharge: 2018-11-08 | Disposition: A | Discharge status: Home / Self Care | Payer: PPO | Attending: Emergency Medicine | Admitting: Emergency Medicine

## 2018-11-08 ENCOUNTER — Encounter: Payer: Self-pay | Admitting: Emergency Medicine

## 2018-11-08 ENCOUNTER — Emergency Department: Payer: PPO

## 2018-11-08 DIAGNOSIS — R52 Pain, unspecified: Secondary | ICD-10-CM | POA: Diagnosis present

## 2018-11-08 DIAGNOSIS — R05 Cough: Secondary | ICD-10-CM | POA: Diagnosis not present

## 2018-11-08 DIAGNOSIS — R062 Wheezing: Secondary | ICD-10-CM | POA: Insufficient documentation

## 2018-11-08 DIAGNOSIS — J449 Chronic obstructive pulmonary disease, unspecified: Secondary | ICD-10-CM | POA: Diagnosis not present

## 2018-11-08 DIAGNOSIS — J101 Influenza due to other identified influenza virus with other respiratory manifestations: Secondary | ICD-10-CM | POA: Diagnosis not present

## 2018-11-08 DIAGNOSIS — Z79899 Other long term (current) drug therapy: Secondary | ICD-10-CM | POA: Diagnosis not present

## 2018-11-08 LAB — INFLUENZA PANEL BY PCR (TYPE A & B)
Influenza A By PCR: POSITIVE — AB
Influenza B By PCR: NEGATIVE

## 2018-11-08 MED ORDER — OSELTAMIVIR PHOSPHATE 75 MG PO CAPS: 75.0000 mg | ORAL_CAPSULE | Freq: Two times a day (BID) | ORAL | 0 refills | Status: DC

## 2018-11-08 MED ORDER — IPRATROPIUM-ALBUTEROL 0.5-2.5 (3) MG/3ML IN SOLN
3.0000 mL | Freq: Once | RESPIRATORY_TRACT | Status: AC
  Administered 2018-11-08: 3 mL via RESPIRATORY_TRACT
  Filled 2018-11-08: qty 3

## 2018-11-08 MED ORDER — GUAIFENESIN-CODEINE 100-10 MG/5ML PO SOLN: 5.0000 mL | ORAL | 0 refills | Status: DC | PRN

## 2018-11-08 NOTE — ED Provider Notes (Signed)
[facility] Emergency Department Provider Note  ____________________________________________   First MD Initiated Contact with Patient 11/08/18 [phone]     (approximate)  I have reviewed the triage vital signs and the nursing notes.   HISTORY  Chief Complaint Chills; Generalized Body Aches; Cough; and Nasal Congestion  HPI Eileen Mills is a 68 y.o. female   presents to the ED with complaint of sudden onset of chills, cough, congestion and generalized body aches starting yesterday.  She denies any fever yesterday.  She states that her grandson was diagnosed with influenza B.  Patient has a history of COPD and states that she has been wheezing some.  She is a former smoker.  Currently she uses a Proventil inhaler as needed and Spiriva daily.  She rates her pain as 5/10.   Past Medical History:  Diagnosis Date  . COPD (chronic obstructive pulmonary disease) [facility])     Patient Active Problem List   Diagnosis Date Noted  . Hypercalcemia 07/10/2017  . Stage 2 moderate COPD by GOLD classification ([place]) 01/07/2017  . Ganglion cyst of wrist 07/02/2015  . Intermittent palpitations 07/02/2015  . Degenerative arthritis of spine with cord compression 07/02/2015  . Abdominal bloating 07/02/2015  . Baker's cyst of knee 07/02/2015  . Hyperlipidemia, mild 07/02/2015    Past Surgical History:  Procedure Laterality Date  . ABDOMINAL HYSTERECTOMY    . COLONOSCOPY N/A 03/03/2015   Procedure: COLONOSCOPY;  Surgeon: Lucilla Lame, MD;  Location: [place];  Service: Gastroenterology;  Laterality: N/A;  . POLYPECTOMY  03/03/2015   Procedure: POLYPECTOMY INTESTINAL;  Surgeon: Lucilla Lame, MD;  Location: [place];  Service: Gastroenterology;;    Prior to Admission medications   Medication Sig Start Date End Date Taking? Authorizing Provider  albuterol (PROVENTIL HFA;VENTOLIN HFA) 108 (90 Base) MCG/ACT inhaler Inhale 2 puffs into the lungs every 6  (six) hours as needed for wheezing or shortness of breath. 01/17/16   Triplett, Cari B, FNP  Ascorbic Acid (VITAMIN C) 1000 MG tablet Take 1,000 mg by mouth every other day.     [provider]  Cholecalciferol (D3-50) 50000 units capsule Take 5,000 Units by mouth daily.    [provider]  guaiFENesin-codeine 100-10 MG/5ML syrup Take 5 mLs by mouth every 4 (four) hours as needed for cough. 11/08/18   Johnn Hai, PA-C  Multiple Vitamins-Minerals (CENTRUM SILVER 50+WOMEN PO) Take 1 tablet by mouth daily.     [provider]  Omega-3 Fatty Acids (FISH OIL) 1000 MG CAPS Take 1,000 mg by mouth daily.     [provider]  oseltamivir (TAMIFLU) 75 MG capsule Take 1 capsule (75 mg total) by mouth 2 (two) times daily for 5 days. 11/08/18 11/13/18  Johnn Hai, PA-C  Tiotropium Bromide Monohydrate (SPIRIVA RESPIMAT) 2.5 MCG/ACT AERS Inhale 2 puffs into the lungs every morning.    [provider]  zinc gluconate 50 MG tablet Take 50 mg by mouth daily.    [provider]    Allergies Sulfa antibiotics  Family History  Problem Relation Age of Onset  . Alzheimer's disease Mother   . CAD Father   . Breast cancer Neg Hx     Social History Social History   Tobacco Use  . Smoking status: Former Smoker    Packs/day: 1.50    Years: 30.00    Pack years: 45.00    Types: Cigarettes    Last attempt to quit: 2012  Years since quitting: 8.1  . Smokeless tobacco: Never Used  Substance Use Topics  . Alcohol use: No    Alcohol/week: 0.0 standard drinks  . Drug use: No    Review of Systems Constitutional: No fever/positive chills Eyes: No visual changes. ENT: No sore throat.  Positive for nasal congestion. Cardiovascular: Denies chest pain. Respiratory: Denies shortness of breath. Gastrointestinal: No abdominal pain.  No nausea, no vomiting.  Musculoskeletal: Positive for body aches. Skin: Negative for rash. Neurological: Negative for  headaches, focal weakness or numbness. ____________________________________________   PHYSICAL EXAM:  VITAL SIGNS: ED Triage Vitals  Enc Vitals Group     BP 11/08/18 0737 125/77     Pulse Rate 11/08/18 0737 (!) 108     Resp 11/08/18 0737 (!) 22     Temp 11/08/18 0737 98.4 F (36.9 C)     Temp Source 11/08/18 0737 Oral     SpO2 11/08/18 0737 94 %     Weight 11/08/18 0734 132 lb (59.9 kg)     Height 11/08/18 0734 5\' 3"  (1.6 m)     Head Circumference --      Peak Flow --      Pain Score 11/08/18 0734 5     Pain Loc --      Pain Edu? --      Excl. in [place]? --    Constitutional: Alert and oriented. Well appearing and in no acute distress. Eyes: Conjunctivae are normal. PERRL. EOMI. Head: Atraumatic. Nose: Minimal congestion/rhinnorhea.  TMs are dull bilaterally. Mouth/Throat: Mucous membranes are moist.  Oropharynx non-erythematous. Neck: No stridor.   Hematological/Lymphatic/Immunilogical: No cervical lymphadenopathy. Cardiovascular: Normal rate, regular rhythm. Grossly normal heart sounds.  Good peripheral circulation. Respiratory: Normal respiratory effort.  No retractions. Lungs CTAB. Musculoskeletal: Moves upper and lower extremities without any difficulty normal gait was noted. Neurologic:  Normal speech and language. No gross focal neurologic deficits are appreciated. No gait instability. Skin:  Skin is warm, dry and intact. No rash noted. Psychiatric: Mood and affect are normal. Speech and behavior are normal.  ____________________________________________   LABS (all labs ordered are listed, but only abnormal results are displayed)  Labs Reviewed  INFLUENZA PANEL BY PCR (TYPE A & B) - Abnormal; Notable for the following components:      Result Value   Influenza A By PCR POSITIVE (*)    All other components within normal limits     RADIOLOGY   Official radiology report(s): Dg Chest 2 View  Result Date: 11/08/2018 CLINICAL DATA:  Pt reports was exposed to the  flu by her grandson, states he has flu B and now she has chills, cough, congestion, fevers and general body aches. Pt has hx of COPD. Pt is a former smoker. EXAM: CHEST - 2 VIEW COMPARISON:  CT of the chest on 01/27/2018 FINDINGS: The lungs are well inflated. Heart size is normal. There are no focal consolidations or pleural effusions. No pulmonary edema. There is mild perihilar peribronchial thickening. IMPRESSION: 1. Stable, probably chronic mild bronchitic changes. 2. No focal acute pulmonary abnormality. Electronically Signed   By: Nolon Nations M.D.   On: 11/08/2018 08:00    ____________________________________________   PROCEDURES  Procedure(s) performed: None  Procedures  Critical Care performed: No  ____________________________________________   INITIAL IMPRESSION / ASSESSMENT AND PLAN / ED COURSE  As part of my medical decision making, I reviewed the following data within the electronic MEDICAL RECORD NUMBER Notes from prior ED visits and [place] Controlled Substance Database  Patient presents to the ED with sudden onset of flulike symptoms without fever beginning yesterday.  She states she was exposed to her grandson who had influenza B.  Patient also has a history of COPD and has continued to take her Spiriva and Proventil.  She states that the cough is keeping her up at night.  Influenza test was positive.  ____________________________________________   FINAL CLINICAL IMPRESSION(S) / ED DIAGNOSES  Final diagnoses:  Influenza A     ED Discharge Orders         Ordered    oseltamivir (TAMIFLU) 75 MG capsule  2 times daily     11/08/18 1028    guaiFENesin-codeine 100-10 MG/5ML syrup  Every 4 hours PRN     11/08/18 1028           Note:  This document was prepared using Dragon voice recognition software and may include unintentional dictation errors.    Johnn Hai, PA-C 11/08/18 1145    Delman Kitten, MD 11/08/18 1230

## 2018-11-08 NOTE — Discharge Instructions (Addendum)
Follow-up with your primary care provider if any continued problems.  Your test came back positive for influenza A.  You are contagious.  Refrain from being in crowds or around other people until you have been symptom-free for 24 hours.  Take Tamiflu twice a day for the next 5 days.  You may also take Tylenol or ibuprofen as needed for body aches or fever.  Robitussin-AC is for cough.  This medication contains a narcotic and should not be taken while you are driving.  Increase fluids.

## 2018-11-08 NOTE — ED Notes (Signed)
See triage note  Presents with body aches and chills  States sxs' started yesterday  Has been exposed to the flu by family  Unsure of fever  Also has had prod cough

## 2018-11-08 NOTE — ED Triage Notes (Signed)
Pt reports was exposed to the flu by her grandson, states he has flu B and now she has chills, cough, congestion, fevers and general bodyaches.

## 2018-11-13 ENCOUNTER — Encounter: Payer: Self-pay | Admitting: Internal Medicine

## 2018-11-13 ENCOUNTER — Ambulatory Visit (INDEPENDENT_AMBULATORY_CARE_PROVIDER_SITE_OTHER): Payer: PPO | Admitting: Internal Medicine

## 2018-11-13 ENCOUNTER — Other Ambulatory Visit: Payer: Self-pay | Admitting: Internal Medicine

## 2018-11-13 VITALS — BP 122/70 | HR 97 | Temp 98.2°F | Ht 63.0 in | Wt 134.0 lb

## 2018-11-13 DIAGNOSIS — R070 Pain in throat: Secondary | ICD-10-CM | POA: Diagnosis not present

## 2018-11-13 DIAGNOSIS — R8290 Unspecified abnormal findings in urine: Secondary | ICD-10-CM

## 2018-11-13 DIAGNOSIS — J441 Chronic obstructive pulmonary disease with (acute) exacerbation: Secondary | ICD-10-CM | POA: Diagnosis not present

## 2018-11-13 LAB — POC URINALYSIS WITH MICROSCOPIC (NON AUTO)MANUAL RESULT
Bilirubin, UA: NEGATIVE
Crystals: 0
EPITHELIAL CELLS, URINE PER MICROSCOPY: 1
Glucose, UA: NEGATIVE
Ketones, UA: NEGATIVE
Mucus, UA: 0
Nitrite, UA: POSITIVE
Protein, UA: NEGATIVE
RBC: 0 M/uL — AB (ref 4.04–5.48)
Spec Grav, UA: 1.01 (ref 1.010–1.025)
Urobilinogen, UA: 0.2 E.U./dL
WBC Casts, UA: 3
pH, UA: 7 (ref 5.0–8.0)

## 2018-11-13 MED ORDER — DOXYCYCLINE HYCLATE 100 MG PO TABS: 100.0000 mg | ORAL_TABLET | Freq: Two times a day (BID) | ORAL | 0 refills | Status: AC

## 2018-11-13 NOTE — Progress Notes (Signed)
Date:  11/13/2018   Name:  Eileen Mills   DOB:  06-02-1951   MRN:  [id number]   Chief Complaint: Influenza (Tested positive for FLU A earlier this week. All this started Saturday. Finished Tamiflu yesterday. Chest XR showed some bronchial activity and wants to follow up on that. She is now having green specs in her mucous.) Positive Flu A on 11/08/18.  Prescribed Tamiflu and Robitussin AC by UC.  Cough  This is a new problem. The current episode started in the past 7 days. The problem has been gradually worsening. The problem occurs every few minutes. The cough is productive of sputum. Associated symptoms include shortness of breath and wheezing. Pertinent negatives include no chest pain, chills, fever or headaches.  Dysuria   This is a new problem. The current episode started in the past 7 days. The problem occurs every urination. The patient is experiencing no pain. There has been no fever. There is no history of pyelonephritis. Pertinent negatives include no chills, frequency, hematuria or urgency. Associated symptoms comments: Chronic urine odor.     Review of Systems  Constitutional: Negative for chills, fatigue and fever.  Respiratory: Positive for cough, shortness of breath and wheezing. Negative for chest tightness.   Cardiovascular: Negative for chest pain and palpitations.  Genitourinary: Negative for dysuria, frequency, hematuria and urgency.  Neurological: Negative for dizziness and headaches.    Patient Active Problem List   Diagnosis Date Noted  . Hypercalcemia 07/10/2017  . Stage 2 moderate COPD by GOLD classification ([place]) 01/07/2017  . Ganglion cyst of wrist 07/02/2015  . Intermittent palpitations 07/02/2015  . Degenerative arthritis of spine with cord compression 07/02/2015  . Abdominal bloating 07/02/2015  . Baker's cyst of knee 07/02/2015  . Hyperlipidemia, mild 07/02/2015    Allergies  Allergen Reactions  . Sulfa Antibiotics Other (See Comments)      It was a long time ago, cannot remember reaction.    Past Surgical History:  Procedure Laterality Date  . ABDOMINAL HYSTERECTOMY    . COLONOSCOPY N/A 03/03/2015   Procedure: COLONOSCOPY;  Surgeon: Lucilla Lame, MD;  Location: [facility];  Service: Gastroenterology;  Laterality: N/A;  . POLYPECTOMY  03/03/2015   Procedure: POLYPECTOMY INTESTINAL;  Surgeon: Lucilla Lame, MD;  Location: [place];  Service: Gastroenterology;;    Social History   Tobacco Use  . Smoking status: Former Smoker    Packs/day: 1.50    Years: 30.00    Pack years: 45.00    Types: Cigarettes    Last attempt to quit: 2012    Years since quitting: 8.1  . Smokeless tobacco: Never Used  Substance Use Topics  . Alcohol use: No    Alcohol/week: 0.0 standard drinks  . Drug use: No     Medication list has been reviewed and updated.  Current Meds  Medication Sig  . albuterol (PROVENTIL HFA;VENTOLIN HFA) 108 (90 Base) MCG/ACT inhaler Inhale 2 puffs into the lungs every 6 (six) hours as needed for wheezing or shortness of breath.  . Ascorbic Acid (VITAMIN C) 1000 MG tablet Take 1,000 mg by mouth every other day.   . Cholecalciferol (D3-50) 50000 units capsule Take 5,000 Units by mouth daily.  . Multiple Vitamins-Minerals (CENTRUM SILVER 50+WOMEN PO) Take 1 tablet by mouth daily.   . Omega-3 Fatty Acids (FISH OIL) 1000 MG CAPS Take 1,000 mg by mouth daily.   . Tiotropium Bromide Monohydrate (SPIRIVA RESPIMAT) 2.5 MCG/ACT AERS Inhale 2 puffs into the lungs  every morning.  . zinc gluconate 50 MG tablet Take 50 mg by mouth daily.  . [DISCONTINUED] oseltamivir (TAMIFLU) 75 MG capsule Take 1 capsule (75 mg total) by mouth 2 (two) times daily for 5 days.    PHQ 2/9 Scores 11/13/2018 06/11/2017  PHQ - 2 Score 0 0    Physical Exam Vitals signs and nursing note reviewed.  Constitutional:      General: She is not in acute distress.    Appearance: She is well-developed.  HENT:     Head: Normocephalic  and atraumatic.  Neck:     Musculoskeletal: Normal range of motion and neck supple.  Cardiovascular:     Rate and Rhythm: Normal rate and regular rhythm.     Pulses: Normal pulses.  Pulmonary:     Effort: Pulmonary effort is normal. No respiratory distress.     Breath sounds: Examination of the right-upper field reveals rhonchi. Examination of the left-upper field reveals rhonchi. Decreased breath sounds and rhonchi present. No wheezing or rales.  Abdominal:     General: Bowel sounds are normal.     Palpations: Abdomen is soft.     Tenderness: There is no abdominal tenderness. There is no right CVA tenderness or left CVA tenderness.  Musculoskeletal: Normal range of motion.  Lymphadenopathy:     Cervical: No cervical adenopathy.  Skin:    General: Skin is warm and dry.     Findings: No rash.  Neurological:     Mental Status: She is alert and oriented to person, place, and time.  Psychiatric:        Behavior: Behavior normal.        Thought Content: Thought content normal.     BP 122/70   Pulse 97   Temp 98.2 F (36.8 C) (Oral)   Ht 5\' 3"  (1.6 m)   Wt 134 lb (60.8 kg)   SpO2 96%   BMI 23.74 kg/m   Assessment and Plan: 1. Acute exacerbation of chronic obstructive pulmonary disease (COPD) (HCC) Following influenza Continue cough syrup if needed Continue albuterol MDI and Spiriva - doxycycline (VIBRA-TABS) 100 MG tablet; Take 1 tablet (100 mg total) by mouth 2 (two) times daily for 10 days.  Dispense: 20 tablet; Refill: 0  2. Bad odor of urine Will send for culture - Urine Culture - POC urinalysis w microscopic (non auto)   Partially dictated using Editor, commissioning. Any errors are unintentional.  Halina Maidens, MD [facility]  11/13/2018

## 2018-11-17 LAB — URINE CULTURE

## 2018-11-20 ENCOUNTER — Other Ambulatory Visit: Payer: Self-pay | Admitting: Internal Medicine

## 2018-11-20 DIAGNOSIS — N3000 Acute cystitis without hematuria: Secondary | ICD-10-CM

## 2018-11-20 MED ORDER — NITROFURANTOIN MONOHYD MACRO 100 MG PO CAPS: 100.0000 mg | ORAL_CAPSULE | Freq: Two times a day (BID) | ORAL | 0 refills | Status: AC

## 2018-11-24 ENCOUNTER — Other Ambulatory Visit: Payer: Self-pay | Admitting: Internal Medicine

## 2018-11-24 DIAGNOSIS — Z1231 Encounter for screening mammogram for malignant neoplasm of breast: Secondary | ICD-10-CM

## 2018-11-30 DIAGNOSIS — J449 Chronic obstructive pulmonary disease, unspecified: Secondary | ICD-10-CM | POA: Diagnosis not present

## 2018-11-30 DIAGNOSIS — R0609 Other forms of dyspnea: Secondary | ICD-10-CM | POA: Diagnosis not present

## 2018-12-20 ENCOUNTER — Encounter: Payer: Self-pay | Admitting: *Deleted

## 2018-12-24 ENCOUNTER — Encounter: Payer: Self-pay | Admitting: *Deleted

## 2019-01-18 ENCOUNTER — Ambulatory Visit (INDEPENDENT_AMBULATORY_CARE_PROVIDER_SITE_OTHER): Payer: PPO | Admitting: Internal Medicine

## 2019-01-18 ENCOUNTER — Other Ambulatory Visit: Payer: Self-pay

## 2019-01-18 ENCOUNTER — Encounter: Payer: Self-pay | Admitting: Internal Medicine

## 2019-01-18 ENCOUNTER — Other Ambulatory Visit: Payer: Self-pay | Admitting: Internal Medicine

## 2019-01-18 VITALS — BP 112/68 | HR 76 | Ht 63.0 in | Wt 131.5 lb

## 2019-01-18 DIAGNOSIS — J449 Chronic obstructive pulmonary disease, unspecified: Secondary | ICD-10-CM | POA: Diagnosis not present

## 2019-01-18 DIAGNOSIS — Z1231 Encounter for screening mammogram for malignant neoplasm of breast: Secondary | ICD-10-CM | POA: Diagnosis not present

## 2019-01-18 DIAGNOSIS — Z0000 Encounter for general adult medical examination without abnormal findings: Secondary | ICD-10-CM

## 2019-01-18 DIAGNOSIS — N3000 Acute cystitis without hematuria: Secondary | ICD-10-CM | POA: Diagnosis not present

## 2019-01-18 DIAGNOSIS — E785 Hyperlipidemia, unspecified: Secondary | ICD-10-CM | POA: Diagnosis not present

## 2019-01-18 DIAGNOSIS — Z23 Encounter for immunization: Secondary | ICD-10-CM | POA: Diagnosis not present

## 2019-01-18 DIAGNOSIS — Z13820 Encounter for screening for osteoporosis: Secondary | ICD-10-CM | POA: Diagnosis not present

## 2019-01-18 LAB — POCT URINALYSIS DIPSTICK
Bilirubin, UA: NEGATIVE
Blood, UA: NEGATIVE
Glucose, UA: NEGATIVE
Ketones, UA: NEGATIVE
Nitrite, UA: POSITIVE
Protein, UA: NEGATIVE
Spec Grav, UA: 1.02 (ref 1.010–1.025)
Urobilinogen, UA: 0.2 E.U./dL
pH, UA: 6 (ref 5.0–8.0)

## 2019-01-18 NOTE — Progress Notes (Signed)
Date:  01/18/2019   Name:  Eileen Mills   DOB:  [birth year]   MRN:  [id number]   Chief Complaint: Annual Exam (Breast Exam. ) Eileen Mills is a 68 y.o. female who presents today for her Complete Annual Exam. She feels well. She reports exercising little due to Covid-19 confinement.. She reports she is sleeping well.   Colonoscopy 2016 Mammogram 01/2018 Due for Prevnar-13 DEXA - none  Shortness of Breath  This is a chronic (COPD) problem. The problem has been unchanged. Pertinent negatives include no abdominal pain, chest pain, fever, headaches, leg swelling, rash, vomiting or wheezing. She has tried beta agonist inhalers (and Spiriva) for the symptoms. Her past medical history is significant for COPD. (And tobacco use - enrolled in the yearly lung cancer screening CT program)  Urinary Tract Infection   This is a recurrent problem. The patient is experiencing no pain. There has been no fever. Pertinent negatives include no chills, frequency, hematuria, urgency or vomiting. Treatments tried: earlier this year had asx UTI - now noticing odor to urine again. and tobacco use - enrolled in the yearly lung cancer screening CT program    Review of Systems  Constitutional: Negative for chills, fatigue and fever.  HENT: Negative for congestion, hearing loss, tinnitus, trouble swallowing and voice change.   Eyes: Negative for visual disturbance.  Respiratory: Positive for shortness of breath. Negative for cough, chest tightness and wheezing.   Cardiovascular: Negative for chest pain, palpitations and leg swelling.  Gastrointestinal: Negative for abdominal pain, constipation, diarrhea and vomiting.  Endocrine: Negative for polydipsia and polyuria.  Genitourinary: Negative for dysuria, frequency, genital sores, hematuria, urgency, vaginal bleeding and vaginal discharge.       Urine odor  Musculoskeletal: Negative for arthralgias, gait problem and joint swelling.  Skin:  Negative for color change and rash.  Allergic/Immunologic: Negative for environmental allergies.  Neurological: Negative for dizziness, tremors, light-headedness and headaches.  Hematological: Negative for adenopathy. Does not bruise/bleed easily.  Psychiatric/Behavioral: Negative for dysphoric mood and sleep disturbance. The patient is not nervous/anxious.     Patient Active Problem List   Diagnosis Date Noted  . Hypercalcemia 07/10/2017  . Stage 2 moderate COPD by GOLD classification ([place]) 01/07/2017  . Ganglion cyst of wrist 07/02/2015  . Intermittent palpitations 07/02/2015  . Degenerative arthritis of spine with cord compression 07/02/2015  . Abdominal bloating 07/02/2015  . Baker's cyst of knee 07/02/2015  . Hyperlipidemia, mild 07/02/2015    Allergies  Allergen Reactions  . Sulfa Antibiotics Other (See Comments)    It was a long time ago, cannot remember reaction.    Past Surgical History:  Procedure Laterality Date  . ABDOMINAL HYSTERECTOMY    . COLONOSCOPY N/A 03/03/2015   Procedure: COLONOSCOPY;  Surgeon: Lucilla Lame, MD;  Location: [place] [facility];  Service: Gastroenterology;  Laterality: N/A;  . POLYPECTOMY  03/03/2015   Procedure: POLYPECTOMY INTESTINAL;  Surgeon: Lucilla Lame, MD;  Location: [place];  Service: Gastroenterology;;    Social History   Tobacco Use  . Smoking status: Former Smoker    Packs/day: 1.50    Years: 30.00    Pack years: 45.00    Types: Cigarettes    Last attempt to quit: 2012    Years since quitting: 8.3  . Smokeless tobacco: Never Used  Substance Use Topics  . Alcohol use: No    Alcohol/week: 0.0 standard drinks  . Drug use: No     Medication list has been reviewed  and updated.  Current Meds  Medication Sig  . albuterol (PROVENTIL HFA;VENTOLIN HFA) 108 (90 Base) MCG/ACT inhaler Inhale 2 puffs into the lungs every 6 (six) hours as needed for wheezing or shortness of breath.  . Ascorbic Acid (VITAMIN C) 1000 MG  tablet Take 1,000 mg by mouth every other day.   . Cholecalciferol (D3-50) 50000 units capsule Take 5,000 Units by mouth daily.  . Multiple Vitamins-Minerals (CENTRUM SILVER 50+WOMEN PO) Take 1 tablet by mouth daily.   . Omega-3 Fatty Acids (FISH OIL) 1000 MG CAPS Take 1,000 mg by mouth daily.   . Tiotropium Bromide Monohydrate (SPIRIVA RESPIMAT) 2.5 MCG/ACT AERS Inhale 2 puffs into the lungs every morning.  . zinc gluconate 50 MG tablet Take 50 mg by mouth daily.    PHQ 2/9 Scores 01/18/2019 11/13/2018 06/11/2017  PHQ - 2 Score 0 0 0    BP Readings from Last 3 Encounters:  01/18/19 112/68  11/13/18 122/70  11/08/18 125/77    Physical Exam Vitals signs and nursing note reviewed.  Constitutional:      General: She is not in acute distress.    Appearance: She is well-developed.  HENT:     Head: Normocephalic and atraumatic.     Right Ear: Tympanic membrane and ear canal normal.     Left Ear: Tympanic membrane and ear canal normal.     Nose:     Right Sinus: No maxillary sinus tenderness.     Left Sinus: No maxillary sinus tenderness.     Mouth/Throat:     Pharynx: Uvula midline.  Eyes:     General: No scleral icterus.       Right eye: No discharge.        Left eye: No discharge.     Conjunctiva/sclera: Conjunctivae normal.  Neck:     Musculoskeletal: Normal range of motion. No erythema.     Thyroid: No thyromegaly.     Vascular: No carotid bruit.  Cardiovascular:     Rate and Rhythm: Normal rate and regular rhythm.     Pulses: Normal pulses.     Heart sounds: Normal heart sounds.  Pulmonary:     Effort: Pulmonary effort is normal. No respiratory distress.     Breath sounds: No wheezing.  Chest:     Breasts:        Right: No mass, nipple discharge, skin change or tenderness.        Left: No mass, nipple discharge, skin change or tenderness.  Abdominal:     General: Bowel sounds are normal.     Palpations: Abdomen is soft.     Tenderness: There is no abdominal  tenderness.  Musculoskeletal: Normal range of motion.  Lymphadenopathy:     Cervical: No cervical adenopathy.  Skin:    General: Skin is warm and dry.     Findings: No rash.  Neurological:     Mental Status: She is alert and oriented to person, place, and time.     Cranial Nerves: No cranial nerve deficit.     Sensory: No sensory deficit.     Deep Tendon Reflexes: Reflexes are normal and symmetric.  Psychiatric:        Speech: Speech normal.        Behavior: Behavior normal.        Thought Content: Thought content normal.     Wt Readings from Last 3 Encounters:  01/18/19 131 lb 8 oz (59.6 kg)  11/13/18 134 lb (60.8 kg)  11/08/18 132  lb (59.9 kg)    BP 112/68   Pulse 76   Ht 5\' 3"  (1.6 m)   Wt 131 lb 8 oz (59.6 kg)   SpO2 96%   BMI 23.29 kg/m   Assessment and Plan: 1. Annual physical exam Normal exam Continue healthy diet, add in exercise - CBC with Differential/Platelet - Comprehensive metabolic panel - TSH - POCT urinalysis dipstick  2. Encounter for screening mammogram for breast cancer Scheduled in May  3. Stage 2 moderate COPD by GOLD classification ([place]) Stable, followed by Pulmonary  4. Hyperlipidemia, mild - Lipid panel  5. Encounter for screening for osteoporosis - DG Bone Density; Future  6. Need for vaccination for pneumococcus - Pneumococcal conjugate vaccine 13-valent IM  7. Acute cystitis without hematuria Will wait for culture to order antibiotics. - Urine Culture   Partially dictated using Editor, commissioning. Any errors are unintentional.  Halina Maidens, MD [facility]  01/18/2019

## 2019-01-19 LAB — CBC WITH DIFFERENTIAL/PLATELET
Basophils Absolute: 0.1 10*3/uL (ref 0.0–0.2)
Basos: 1 %
EOS (ABSOLUTE): 0.1 10*3/uL (ref 0.0–0.4)
Eos: 2 %
Hematocrit: 42.2 % (ref 34.0–46.6)
Hemoglobin: 13.9 g/dL (ref 11.1–15.9)
Immature Grans (Abs): 0 10*3/uL (ref 0.0–0.1)
Immature Granulocytes: 0 %
Lymphocytes Absolute: 2.7 10*3/uL (ref 0.7–3.1)
Lymphs: 32 %
MCH: 29.6 pg (ref 26.6–33.0)
MCHC: 32.9 g/dL (ref 31.5–35.7)
MCV: 90 fL (ref 79–97)
Monocytes Absolute: 0.5 10*3/uL (ref 0.1–0.9)
Monocytes: 6 %
Neutrophils Absolute: 5 10*3/uL (ref 1.4–7.0)
Neutrophils: 59 %
Platelets: 345 10*3/uL (ref 150–450)
RBC: 4.7 x10E6/uL (ref 3.77–5.28)
RDW: 13.1 % (ref 11.7–15.4)
WBC: 8.4 10*3/uL (ref 3.4–10.8)

## 2019-01-19 LAB — COMPREHENSIVE METABOLIC PANEL
ALT: 30 IU/L (ref 0–32)
AST: 23 IU/L (ref 0–40)
Albumin/Globulin Ratio: 1.6 (ref 1.2–2.2)
Albumin: 4.2 g/dL (ref 3.8–4.8)
Alkaline Phosphatase: 94 IU/L (ref 39–117)
BUN/Creatinine Ratio: 11 — ABNORMAL LOW (ref 12–28)
BUN: 9 mg/dL (ref 8–27)
Bilirubin Total: 0.3 mg/dL (ref 0.0–1.2)
CO2: 26 mmol/L (ref 20–29)
Calcium: 10 mg/dL (ref 8.7–10.3)
Chloride: 100 mmol/L (ref 96–106)
Creatinine, Ser: 0.8 mg/dL (ref 0.57–1.00)
GFR calc Af Amer: 89 mL/min/{1.73_m2} (ref >59)
GFR calc non Af Amer: 78 mL/min/{1.73_m2} (ref >59)
Globulin, Total: 2.6 g/dL (ref 1.5–4.5)
Glucose: 93 mg/dL (ref 65–99)
Potassium: 4.5 mmol/L (ref 3.5–5.2)
Sodium: 141 mmol/L (ref 134–144)
Total Protein: 6.8 g/dL (ref 6.0–8.5)

## 2019-01-19 LAB — LIPID PANEL
Chol/HDL Ratio: 3.1 ratio (ref 0.0–4.4)
Cholesterol, Total: 270 mg/dL — ABNORMAL HIGH (ref 100–199)
HDL: 87 mg/dL (ref >39)
LDL Calculated: 157 mg/dL — ABNORMAL HIGH (ref 0–99)
Triglycerides: 131 mg/dL (ref 0–149)
VLDL Cholesterol Cal: 26 mg/dL (ref 5–40)

## 2019-01-19 LAB — TSH: TSH: 1.69 u[IU]/mL (ref 0.450–4.500)

## 2019-01-20 LAB — URINE CULTURE

## 2019-01-21 ENCOUNTER — Other Ambulatory Visit: Payer: Self-pay | Admitting: Internal Medicine

## 2019-01-21 DIAGNOSIS — N3000 Acute cystitis without hematuria: Secondary | ICD-10-CM

## 2019-01-21 MED ORDER — AMOXICILLIN-POT CLAVULANATE 875-125 MG PO TABS: 1.0000 | ORAL_TABLET | Freq: Two times a day (BID) | ORAL | 0 refills | Status: AC

## 2019-02-24 ENCOUNTER — Telehealth: Payer: Self-pay | Admitting: *Deleted

## 2019-02-24 DIAGNOSIS — Z87891 Personal history of nicotine dependence: Secondary | ICD-10-CM

## 2019-02-24 DIAGNOSIS — Z122 Encounter for screening for malignant neoplasm of respiratory organs: Secondary | ICD-10-CM

## 2019-02-24 NOTE — Telephone Encounter (Signed)
Patient has been notified that annual lung cancer screening low dose CT scan is due currently or will be in near future. Confirmed that patient is within the age range of 55-77, and asymptomatic, (no signs or symptoms of lung cancer). Patient denies illness that would prevent curative treatment for lung cancer if found. Verified smoking history, (former, quit 09/30/10, 45 pack year). The shared decision making visit was done 01/27/18. Patient is agreeable for CT scan being scheduled.

## 2019-03-02 ENCOUNTER — Ambulatory Visit
Admission: RE | Admit: 2019-03-02 | Discharge: 2019-03-02 | Disposition: A | Discharge status: Home / Self Care | Payer: PPO | Source: Ambulatory Visit | Attending: Oncology | Admitting: Oncology

## 2019-03-02 ENCOUNTER — Other Ambulatory Visit: Payer: Self-pay

## 2019-03-02 DIAGNOSIS — Z87891 Personal history of nicotine dependence: Secondary | ICD-10-CM | POA: Diagnosis not present

## 2019-03-02 DIAGNOSIS — Z122 Encounter for screening for malignant neoplasm of respiratory organs: Secondary | ICD-10-CM | POA: Diagnosis not present

## 2019-03-05 ENCOUNTER — Encounter: Payer: Self-pay | Admitting: *Deleted

## 2019-04-06 DIAGNOSIS — J449 Chronic obstructive pulmonary disease, unspecified: Secondary | ICD-10-CM | POA: Diagnosis not present

## 2019-04-14 ENCOUNTER — Other Ambulatory Visit: Payer: PPO

## 2019-04-21 ENCOUNTER — Other Ambulatory Visit: Payer: PPO

## 2019-04-21 ENCOUNTER — Ambulatory Visit
Admission: RE | Admit: 2019-04-21 | Discharge: 2019-04-21 | Disposition: A | Discharge status: Home / Self Care | Payer: PPO | Source: Ambulatory Visit | Attending: Internal Medicine | Admitting: Internal Medicine

## 2019-04-21 DIAGNOSIS — Z1231 Encounter for screening mammogram for malignant neoplasm of breast: Secondary | ICD-10-CM

## 2019-06-29 ENCOUNTER — Other Ambulatory Visit: Payer: Self-pay

## 2019-06-29 ENCOUNTER — Encounter: Payer: Self-pay | Admitting: Internal Medicine

## 2019-06-29 ENCOUNTER — Telehealth: Payer: Self-pay | Admitting: Internal Medicine

## 2019-06-29 ENCOUNTER — Ambulatory Visit (INDEPENDENT_AMBULATORY_CARE_PROVIDER_SITE_OTHER): Payer: PPO | Admitting: Internal Medicine

## 2019-06-29 VITALS — Temp 98.3°F | Ht 63.0 in | Wt 134.0 lb

## 2019-06-29 DIAGNOSIS — J441 Chronic obstructive pulmonary disease with (acute) exacerbation: Secondary | ICD-10-CM | POA: Diagnosis not present

## 2019-06-29 MED ORDER — PREDNISONE 10 MG PO TABS: 10.0000 mg | ORAL_TABLET | ORAL | 0 refills | Status: AC

## 2019-06-29 MED ORDER — DOXYCYCLINE HYCLATE 100 MG PO TABS: 100.0000 mg | ORAL_TABLET | Freq: Two times a day (BID) | ORAL | 0 refills | Status: AC

## 2019-06-29 NOTE — Telephone Encounter (Signed)
420 VV please. Tell pt to take BP and tempeture if able before visit. I will call her between 4 and 420.

## 2019-06-29 NOTE — Telephone Encounter (Signed)
Deep cough, coughing up yellow/green stuff, difficulty breathing and some wheesing may be bronchitis per pt. Been three days already is willing to do a VV.

## 2019-06-29 NOTE — Progress Notes (Signed)
Date:  06/29/2019   Name:  Eileen Mills   DOB:  March 03, 1951   MRN:  [id number]  I connected with this patient, Eileen Mills, by telephone at the patient's home.  I verified that I am speaking with the correct person using two identifiers. This visit was conducted via telephone due to the Covid-19 outbreak from my office at [facility] in [place], [place]. I discussed the limitations, risks, security and privacy concerns of performing an evaluation and management service by telephone. I also discussed with the patient that there may be a patient responsible charge related to this service. The patient expressed understanding and agreed to proceed.  Chief Complaint: Cough (X 1 week. Cough- yellow/green mucous. Difficulty catching breathe at time with wheezing. )  Cough This is a new problem. The current episode started in the past 7 days. The problem has been gradually worsening. The problem occurs every few minutes. The cough is productive of sputum. Associated symptoms include shortness of breath and wheezing. Pertinent negatives include no chest pain, chills, fever, headaches or sore throat. She has tried a beta-agonist inhaler (and spiriva) for the symptoms. The treatment provided moderate relief. Her past medical history is significant for COPD.    Review of Systems  Constitutional: Negative for chills, diaphoresis, fatigue and fever.  HENT: Negative for sore throat and trouble swallowing.   Respiratory: Positive for cough, shortness of breath and wheezing.   Cardiovascular: Negative for chest pain.  Gastrointestinal: Negative for diarrhea, nausea and vomiting.  Neurological: Negative for dizziness, syncope and headaches.    Patient Active Problem List   Diagnosis Date Noted  . Hypercalcemia 07/10/2017  . Stage 2 moderate COPD by GOLD classification ([place]) 01/07/2017  . Ganglion cyst of wrist 07/02/2015  . Intermittent palpitations 07/02/2015  . Degenerative  arthritis of spine with cord compression 07/02/2015  . Abdominal bloating 07/02/2015  . Baker's cyst of knee 07/02/2015  . Hyperlipidemia, mild 07/02/2015    Allergies  Allergen Reactions  . Sulfa Antibiotics Other (See Comments)    It was a long time ago, cannot remember reaction.    Past Surgical History:  Procedure Laterality Date  . ABDOMINAL HYSTERECTOMY    . COLONOSCOPY N/A 03/03/2015   Procedure: COLONOSCOPY;  Surgeon: Lucilla Lame, MD;  Location: [facility];  Service: Gastroenterology;  Laterality: N/A;  . POLYPECTOMY  03/03/2015   Procedure: POLYPECTOMY INTESTINAL;  Surgeon: Lucilla Lame, MD;  Location: [facility];  Service: Gastroenterology;;    Social History   Tobacco Use  . Smoking status: Former Smoker    Packs/day: 1.50    Years: 30.00    Pack years: 45.00    Types: Cigarettes    Quit date: 2012    Years since quitting: 8.7  . Smokeless tobacco: Never Used  Substance Use Topics  . Alcohol use: No    Alcohol/week: 0.0 standard drinks  . Drug use: No     Medication list has been reviewed and updated.  Current Meds  Medication Sig  . albuterol (PROVENTIL HFA;VENTOLIN HFA) 108 (90 Base) MCG/ACT inhaler Inhale 2 puffs into the lungs every 6 (six) hours as needed for wheezing or shortness of breath.  . Ascorbic Acid (VITAMIN C) 1000 MG tablet Take 1,000 mg by mouth every other day.   . Cholecalciferol (D3-50) 50000 units capsule Take 5,000 Units by mouth daily.  . Multiple Vitamins-Minerals (CENTRUM SILVER 50+WOMEN PO) Take 1 tablet by mouth daily.   . Omega-3 Fatty Acids (FISH  OIL) 1000 MG CAPS Take 1,000 mg by mouth daily.   . Tiotropium Bromide Monohydrate (SPIRIVA RESPIMAT) 2.5 MCG/ACT AERS Inhale 2 puffs into the lungs every morning.  . zinc gluconate 50 MG tablet Take 50 mg by mouth daily.    PHQ 2/9 Scores 06/29/2019 01/18/2019 11/13/2018 06/11/2017  PHQ - 2 Score 0 0 0 0    BP Readings from Last 3 Encounters:  01/18/19 112/68   11/13/18 122/70  11/08/18 125/77    Physical Exam Pulmonary:     Comments: cough noted during the call - tight with some mild wheezing No dyspnea - pt able to speak normally Neurological:     Mental Status: She is alert.  Psychiatric:        Attention and Perception: Attention normal.        Mood and Affect: Mood normal.        Speech: Speech normal.        Cognition and Memory: Cognition normal.     Wt Readings from Last 3 Encounters:  06/29/19 134 lb (60.8 kg)  03/02/19 134 lb (60.8 kg)  01/18/19 131 lb 8 oz (59.6 kg)    Temp 98.3 F (36.8 C) (Oral)   Ht 5\' 3"  (1.6 m)   Wt 134 lb (60.8 kg)   BMI 23.74 kg/m   Assessment and Plan: 1. COPD with exacerbation (HCC) Continue Spiriva daily Use albuterol MDI every 6 hours as needed Use Robitussin AC at bedtime PRN - doxycycline (VIBRA-TABS) 100 MG tablet; Take 1 tablet (100 mg total) by mouth 2 (two) times daily for 10 days.  Dispense: 20 tablet; Refill: 0 - predniSONE (DELTASONE) 10 MG tablet; Take 1 tablet (10 mg total) by mouth as directed for 6 days. Take 6,5,4,3,2,1 then stop  Dispense: 21 tablet; Refill: 0  I spent 7 minutes on this encounter. Partially dictated using Editor, commissioning. Any errors are unintentional.  Halina Maidens, MD [facility]  06/29/2019

## 2019-07-19 ENCOUNTER — Ambulatory Visit (INDEPENDENT_AMBULATORY_CARE_PROVIDER_SITE_OTHER): Payer: PPO

## 2019-07-19 VITALS — Ht 63.0 in | Wt 133.0 lb

## 2019-07-19 DIAGNOSIS — Z0000 Encounter for general adult medical examination without abnormal findings: Secondary | ICD-10-CM | POA: Diagnosis not present

## 2019-07-19 NOTE — Progress Notes (Signed)
Subjective:   Eileen Mills is a 68 y.o. female who presents for an Initial Medicare Annual Wellness Visit.  Virtual Visit via Telephone Note  I connected with Eileen Mills on 07/19/19 at 10:00 AM EDT by telephone and verified that I am speaking with the correct person using two identifiers.  Medicare Annual Wellness visit completed telephonically due to Covid-19 pandemic.   Location: Patient: home Provider: office   I discussed the limitations, risks, security and privacy concerns of performing an evaluation and management service by telephone and the availability of in person appointments. The patient expressed understanding and agreed to proceed.  Some vital signs may be absent or patient reported.   Eileen Marker, LPN    Review of Systems      Cardiac Risk Factors include: advanced age (>82men, >54 women)     Objective:    Today's Vitals   07/19/19 1011  Weight: 133 lb (60.3 kg)  Height: 5\' 3"  (1.6 m)   Body mass index is 23.56 kg/m.  Advanced Directives 07/19/2019 11/08/2018 10/28/2018 01/19/2016 01/05/2016 10/13/2015  Does Patient Have a Medical Advance Directive? Yes No No Yes Yes Yes  Type of Paramedic of [place];Living will - - Living will Living will [place];Living will  Copy of [place] in Chart? No - copy requested - - No - copy requested - -  Would patient like information on creating a medical advance directive? - No - Patient declined No - Patient declined No - patient declined information - -    Current Medications (verified) Outpatient Encounter Medications as of 07/19/2019  Medication Sig  . albuterol (PROVENTIL HFA;VENTOLIN HFA) 108 (90 Base) MCG/ACT inhaler Inhale 2 puffs into the lungs every 6 (six) hours as needed for wheezing or shortness of breath.  . Ascorbic Acid (VITAMIN C) 1000 MG tablet Take 1,000 mg by mouth every other day.   . Cholecalciferol (D3-50)  50000 units capsule Take 5,000 Units by mouth daily.  . Multiple Vitamins-Minerals (CENTRUM SILVER 50+WOMEN PO) Take 1 tablet by mouth daily.   . Omega-3 Fatty Acids (FISH OIL) 1000 MG CAPS Take 1,000 mg by mouth daily.   . Tiotropium Bromide Monohydrate (SPIRIVA RESPIMAT) 2.5 MCG/ACT AERS Inhale 2 puffs into the lungs every morning.  . zinc gluconate 50 MG tablet Take 50 mg by mouth daily.   No facility-administered encounter medications on file as of 07/19/2019.     Allergies (verified) Sulfa antibiotics   History: Past Medical History:  Diagnosis Date  . Arthritis   . Cataract   . COPD (chronic obstructive pulmonary disease) ([place])   . Emphysema of lung ([place])   . Hyperlipidemia    Past Surgical History:  Procedure Laterality Date  . ABDOMINAL HYSTERECTOMY    . CESAREAN SECTION    . COLONOSCOPY N/A 03/03/2015   Procedure: COLONOSCOPY;  Surgeon: Lucilla Lame, MD;  Location: [facility];  Service: Gastroenterology;  Laterality: N/A;  . POLYPECTOMY  03/03/2015   Procedure: POLYPECTOMY INTESTINAL;  Surgeon: Lucilla Lame, MD;  Location: [facility];  Service: Gastroenterology;;  . TUBAL LIGATION     Family History  Problem Relation Age of Onset  . Alzheimer's disease Mother   . CAD Father   . Breast cancer Neg Hx    Social History   Socioeconomic History  . Marital status: Widowed    Spouse name: Not on file  . Number of children: 4  . Years of education: Not  on file  . Highest education level: High school graduate  Occupational History  . Not on file  Social Needs  . Financial resource strain: Not hard at all  . Food insecurity    Worry: Never true    Inability: Never true  . Transportation needs    Medical: No    Non-medical: No  Tobacco Use  . Smoking status: Former Smoker    Packs/day: 1.50    Years: 15.00    Pack years: 22.50    Types: Cigarettes    Quit date: 2012    Years since quitting: 8.8  . Smokeless tobacco: Never Used  Substance and  Sexual Activity  . Alcohol use: No    Alcohol/week: 0.0 standard drinks  . Drug use: No  . Sexual activity: Not Currently  Lifestyle  . Physical activity    Days per week: 0 days    Minutes per session: 0 min  . Stress: Not at all  Relationships  . Social connections    Talks on phone: More than three times a week    Gets together: Three times a week    Attends religious service: Never    Active member of club or organization: No    Attends meetings of clubs or organizations: Never    Relationship status: Widowed  Other Topics Concern  . Not on file  Social History Narrative  . Not on file    Tobacco Counseling Counseling given: Not Answered   Clinical Intake:  Pre-visit preparation completed: Yes  Pain : No/denies pain     BMI - recorded: 23.56 Nutritional Status: BMI of 19-24  Normal Nutritional Risks: None Diabetes: No  How often do you need to have someone help you when you read instructions, pamphlets, or other written materials from your doctor or pharmacy?: 1 - Never  Interpreter Needed?: No  Information entered by :: Eileen Marker LPN   Activities of Daily Living In your present state of health, do you have any difficulty performing the following activities: 07/19/2019 06/29/2019  Hearing? N N  Comment pt declines hearing aids -  Vision? N N  Difficulty concentrating or making decisions? N N  Walking or climbing stairs? N N  Dressing or bathing? N N  Doing errands, shopping? N N  Preparing Food and eating ? N -  Using the Toilet? N -  In the past six months, have you accidently leaked urine? N -  Do you have problems with loss of bowel control? N -  Managing your Medications? N -  Managing your Finances? N -  Housekeeping or managing your Housekeeping? N -  Some recent data might be hidden     Immunizations and Health Maintenance Immunization History  Administered Date(s) Administered  . Influenza Inj Mdck Quad Pf 06/23/2018  . Influenza,  High Dose Seasonal PF 07/17/2019  . Influenza,inj,Quad PF,6+ Mos 07/07/2015, 07/10/2017  . Influenza-Unspecified 06/30/2016  . Pneumococcal Conjugate-13 01/18/2019  . Pneumococcal Polysaccharide-23 01/07/2017  . Tdap 01/05/2016  . Zoster 12/22/2013   Health Maintenance Due  Topic Date Due  . DEXA SCAN  07/07/2018    Patient Care Team: Glean Hess, MD as PCP - General (Internal Medicine)  Skin (Dermatology) Erby Pian, MD as Referring Physician (Specialist)  Indicate any recent Medical Services you may have received from other than [facility] providers in the past year (date may be approximate).     Assessment:   This is a routine wellness examination for Romanda.  Hearing/Vision  screen  Hearing Screening   125Hz  250Hz  500Hz  1000Hz  2000Hz  3000Hz  4000Hz  6000Hz  8000Hz   Right ear:           Left ear:           Comments: Pt c/o mild hearing difficulty but has not had hearing evaluation.   Vision Screening Comments: [facility] screen[facility]  Dietary issues and exercise activities discussed: Current Exercise Habits: The patient does not participate in regular exercise at present, Exercise limited by: respiratory conditions(s)  Goals    . Increase physical activity     Recommend increasing physical activity to at least 3 days per week      Depression Screen PHQ 2/9 Scores 07/19/2019 06/29/2019 01/18/2019 11/13/2018 06/11/2017  PHQ - 2 Score 0 0 0 0 0    Fall Risk Fall Risk  07/19/2019 01/18/2019 06/11/2017  Falls in the past year? 0 0 No  Number falls in past yr: 0 0 -  Injury with Fall? 0 0 -  Follow up Falls prevention discussed Falls evaluation completed -   FALL RISK PREVENTION PERTAINING TO THE HOME:  Any stairs in or around the home? Yes  If so, do they handrails? Yes   Home free of loose throw rugs in walkways, pet beds, electrical cords, etc? Yes  Adequate lighting in your home to reduce risk of falls? Yes    ASSISTIVE DEVICES UTILIZED TO PREVENT FALLS:  Life alert? No  Use of a cane, walker or w/c? No  Grab bars in the bathroom? No  Shower chair or bench in shower? No  Elevated toilet seat or a handicapped toilet? No   DME ORDERS:  DME order needed?  No   TIMED UP AND GO:  Was the test performed? No . Telephonic visit.   Education: Fall risk prevention has been discussed.  Intervention(s) required? No   Cognitive Function:     6CIT Screen 07/19/2019  What Year? 0 points  What month? 0 points  What time? 0 points  Count back from 20 0 points  Months in reverse 0 points  Repeat phrase 0 points  Total Score 0    Screening Tests Health Maintenance  Topic Date Due  . DEXA SCAN  07/07/2018  . MAMMOGRAM  04/20/2020  . PNA vac Low Risk Adult (2 of 2 - PPSV23) 01/07/2022  . COLONOSCOPY  03/02/2025  . TETANUS/TDAP  01/04/2026  . INFLUENZA VACCINE  Completed  . Hepatitis C Screening  Completed    Qualifies for Shingles Vaccine? Yes  Zostavax completed 2015. Due for Shingrix. Education has been provided regarding the importance of this vaccine. Pt has been advised to call insurance company to determine out of pocket expense. Advised may also receive vaccine at local pharmacy or Health Dept. Verbalized acceptance and understanding.  Tdap: Up to date  Flu Vaccine: Up to date  Pneumococcal Vaccine: Up to date   Cancer Screenings:  Colorectal Screening: Completed 03/03/15. Repeat every 10 years.  Mammogram: Completed 04/21/19. Repeat every year.  Bone Density: Not completed . Ordered 01/18/19. Pt provided with contact information and advised to call to schedule appt. Pt states insurance would not cover.   Lung Cancer Screening: (Low Dose CT Chest recommended if Age 65-80 years, 30 pack-year currently smoking OR have quit w/in 15years.) does qualify. Completed 03/02/19.   Additional Screening:  Hepatitis C Screening: does qualify; Completed 01/05/16.  Vision Screening:  Recommended annual ophthalmology exams for early detection of glaucoma and other disorders  of the eye. Is the patient up to date with their annual eye exam?  Yes  Who is the provider or what is the name of the office in which the pt attends annual eye exams? [facility]  Dental Screening: Recommended annual dental exams for proper oral hygiene  Community Resource Referral:  CRR required this visit?  No      Plan:    I have personally reviewed and addressed the Medicare Annual Wellness questionnaire and have noted the following in the patient's chart:  A. Medical and social history B. Use of alcohol, tobacco or illicit drugs  C. Current medications and supplements D. Functional ability and status E.  Nutritional status F.  Physical activity G. Advance directives H. List of other physicians I.  Hospitalizations, surgeries, and ER visits in previous 12 months J.  [place] such as hearing and vision if needed, cognitive and depression L. Referrals and appointments   In addition, I have reviewed and discussed with patient certain preventive protocols, quality metrics, and best practice recommendations. A written personalized care plan for preventive services as well as general preventive health recommendations were provided to patient.   Signed,  Eileen Marker, LPN Nurse Health Advisor   Nurse Notes: none

## 2019-07-19 NOTE — Patient Instructions (Signed)
Ms. Eileen Mills , Thank you for taking time to come for your Medicare Wellness Visit. I appreciate your ongoing commitment to your health goals. Please review the following plan we discussed and let me know if I can assist you in the future.   Screening recommendations/referrals: Colonoscopy: done 03/03/15. Repeat in 2026. Mammogram: done 04/21/19 Bone Density: Please call [phone] to schedule your bone density screening.  Recommended yearly ophthalmology/optometry visit for glaucoma screening and checkup Recommended yearly dental visit for hygiene and checkup  Vaccinations: Influenza vaccine: done 07/17/19 Pneumococcal vaccine: done 01/18/19 Tdap vaccine: done 01/05/16 Shingles vaccine: Shingrix discussed. Please contact your pharmacy for coverage information.   Advanced directives: Please bring a copy of your health care power of attorney and living will to the office at your convenience.  Conditions/risks identified: Keep up the great work!  Next appointment: Please follow up in one year for your Medicare Annual Wellness visit.    Preventive Care 80 Years and Older, Female Preventive care refers to lifestyle choices and visits with your health care provider that can promote health and wellness. What does preventive care include?  A yearly physical exam. This is also called an annual well check.  Dental exams once or twice a year.  Routine eye exams. Ask your health care provider how often you should have your eyes checked.  Personal lifestyle choices, including:  Daily care of your teeth and gums.  Regular physical activity.  Eating a healthy diet.  Avoiding tobacco and drug use.  Limiting alcohol use.  Practicing safe sex.  Taking low-dose aspirin every day.  Taking vitamin and mineral supplements as recommended by your health care provider. What happens during an annual well check? The services and screenings done by your health care provider during your annual  well check will depend on your age, overall health, lifestyle risk factors, and family history of disease. Counseling  Your health care provider may ask you questions about your:  Alcohol use.  Tobacco use.  Drug use.  Emotional well-being.  Home and relationship well-being.  Sexual activity.  Eating habits.  History of falls.  Memory and ability to understand (cognition).  Work and work Statistician.  Reproductive health. Screening  You may have the following tests or measurements:  Height, weight, and BMI.  Blood pressure.  Lipid and cholesterol levels. These may be checked every 5 years, or more frequently if you are over 82 years old.  Skin check.  Lung cancer screening. You may have this screening every year starting at age 51 if you have a 30-pack-year history of smoking and currently smoke or have quit within the past 15 years.  Fecal occult blood test (FOBT) of the stool. You may have this test every year starting at age 41.  Flexible sigmoidoscopy or colonoscopy. You may have a sigmoidoscopy every 5 years or a colonoscopy every 10 years starting at age 39.  Hepatitis C blood test.  Hepatitis B blood test.  Sexually transmitted disease (STD) testing.  Diabetes screening. This is done by checking your blood sugar (glucose) after you have not eaten for a while (fasting). You may have this done every 1-3 years.  Bone density scan. This is done to screen for osteoporosis. You may have this done starting at age 40.  Mammogram. This may be done every 1-2 years. Talk to your health care provider about how often you should have regular mammograms. Talk with your health care provider about your test results, treatment options, and if necessary, the  need for more tests. Vaccines  Your health care provider may recommend certain vaccines, such as:  Influenza vaccine. This is recommended every year.  Tetanus, diphtheria, and acellular pertussis (Tdap, Td) vaccine.  You may need a Td booster every 10 years.  Zoster vaccine. You may need this after age 81.  Pneumococcal 13-valent conjugate (PCV13) vaccine. One dose is recommended after age 23.  Pneumococcal polysaccharide (PPSV23) vaccine. One dose is recommended after age 76. Talk to your health care provider about which screenings and vaccines you need and how often you need them. This information is not intended to replace advice given to you by your health care provider. Make sure you discuss any questions you have with your health care provider. Document Released: 10/13/2015 Document Revised: 06/05/2016 Document Reviewed: 07/18/2015 Elsevier Interactive Patient Education  2017 [place] Prevention in the Home Falls can cause injuries. They can happen to people of all ages. There are many things you can do to make your home safe and to help prevent falls. What can I do on the outside of my home?  Regularly fix the edges of walkways and driveways and fix any cracks.  Remove anything that might make you trip as you walk through a door, such as a raised step or threshold.  Trim any bushes or trees on the path to your home.  Use bright outdoor lighting.  Clear any walking paths of anything that might make someone trip, such as rocks or tools.  Regularly check to see if handrails are loose or broken. Make sure that both sides of any steps have handrails.  Any raised decks and porches should have guardrails on the edges.  Have any leaves, snow, or ice cleared regularly.  Use sand or salt on walking paths during winter.  Clean up any spills in your garage right away. This includes oil or grease spills. What can I do in the bathroom?  Use night lights.  Install grab bars by the toilet and in the tub and shower. Do not use towel bars as grab bars.  Use non-skid mats or decals in the tub or shower.  If you need to sit down in the shower, use a plastic, non-slip stool.  Keep the  floor dry. Clean up any water that spills on the floor as soon as it happens.  Remove soap buildup in the tub or shower regularly.  Attach bath mats securely with double-sided non-slip rug tape.  Do not have throw rugs and other things on the floor that can make you trip. What can I do in the bedroom?  Use night lights.  Make sure that you have a light by your bed that is easy to reach.  Do not use any sheets or blankets that are too big for your bed. They should not hang down onto the floor.  Have a firm chair that has side arms. You can use this for support while you get dressed.  Do not have throw rugs and other things on the floor that can make you trip. What can I do in the kitchen?  Clean up any spills right away.  Avoid walking on wet floors.  Keep items that you use a lot in easy-to-reach places.  If you need to reach something above you, use a strong step stool that has a grab bar.  Keep electrical cords out of the way.  Do not use floor polish or wax that makes floors slippery. If you must use wax,  use non-skid floor wax.  Do not have throw rugs and other things on the floor that can make you trip. What can I do with my stairs?  Do not leave any items on the stairs.  Make sure that there are handrails on both sides of the stairs and use them. Fix handrails that are broken or loose. Make sure that handrails are as long as the stairways.  Check any carpeting to make sure that it is firmly attached to the stairs. Fix any carpet that is loose or worn.  Avoid having throw rugs at the top or bottom of the stairs. If you do have throw rugs, attach them to the floor with carpet tape.  Make sure that you have a light switch at the top of the stairs and the bottom of the stairs. If you do not have them, ask someone to add them for you. What else can I do to help prevent falls?  Wear shoes that:  Do not have high heels.  Have rubber bottoms.  Are comfortable and fit  you well.  Are closed at the toe. Do not wear sandals.  If you use a stepladder:  Make sure that it is fully opened. Do not climb a closed stepladder.  Make sure that both sides of the stepladder are locked into place.  Ask someone to hold it for you, if possible.  Clearly mark and make sure that you can see:  Any grab bars or handrails.  First and last steps.  Where the edge of each step is.  Use tools that help you move around (mobility aids) if they are needed. These include:  Canes.  Walkers.  Scooters.  Crutches.  Turn on the lights when you go into a dark area. Replace any light bulbs as soon as they burn out.  Set up your furniture so you have a clear path. Avoid moving your furniture around.  If any of your floors are uneven, fix them.  If there are any pets around you, be aware of where they are.  Review your medicines with your doctor. Some medicines can make you feel dizzy. This can increase your chance of falling. Ask your doctor what other things that you can do to help prevent falls. This information is not intended to replace advice given to you by your health care provider. Make sure you discuss any questions you have with your health care provider. Document Released: 07/13/2009 Document Revised: 02/22/2016 Document Reviewed: 10/21/2014 Elsevier Interactive Patient Education  2017 [employer].

## 2019-09-07 ENCOUNTER — Other Ambulatory Visit: Payer: Self-pay

## 2019-09-07 ENCOUNTER — Telehealth: Payer: Self-pay

## 2019-09-07 ENCOUNTER — Ambulatory Visit (INDEPENDENT_AMBULATORY_CARE_PROVIDER_SITE_OTHER): Payer: PPO | Admitting: Internal Medicine

## 2019-09-07 ENCOUNTER — Encounter: Payer: Self-pay | Admitting: Internal Medicine

## 2019-09-07 VITALS — Temp 96.8°F | Ht 63.0 in | Wt 133.0 lb

## 2019-09-07 DIAGNOSIS — J441 Chronic obstructive pulmonary disease with (acute) exacerbation: Secondary | ICD-10-CM | POA: Diagnosis not present

## 2019-09-07 MED ORDER — PREDNISONE 10 MG PO TABS: 10.0000 mg | ORAL_TABLET | ORAL | 0 refills | Status: AC

## 2019-09-07 MED ORDER — DOXYCYCLINE HYCLATE 100 MG PO TABS: 100.0000 mg | ORAL_TABLET | Freq: Two times a day (BID) | ORAL | 0 refills | Status: AC

## 2019-09-07 NOTE — Progress Notes (Signed)
Date:  09/07/2019   Name:  Eileen Mills   DOB:  07-29-1951   MRN:  [id number]  I connected with this patient, Kandice Hams, by telephone at the patient's home.  I verified that I am speaking with the correct person using two identifiers. This visit was conducted via telephone due to the Covid-19 outbreak from my office at [facility] in [place], [place]. I discussed the limitations, risks, security and privacy concerns of performing an evaluation and management service by telephone. I also discussed with the patient that there may be a patient responsible charge related to this service. The patient expressed understanding and agreed to proceed.  Chief Complaint: Cough (Cough- yellow/green mucous, facial congestion. No fever. Started 2-3 days ago. No taste/smell change. No SOB. )  Sinus Problem This is a new problem. The current episode started in the past 7 days. There has been no fever. Associated symptoms include congestion, coughing (with yellow/green mucus), headaches and sneezing. Pertinent negatives include no chills, shortness of breath or sinus pressure. Treatments tried: Theraflu.  Cough This is a new problem. The current episode started in the past 7 days. The problem has been gradually worsening. The problem occurs every few minutes. The cough is productive of sputum. Associated symptoms include headaches. Pertinent negatives include no chest pain, chills, shortness of breath or wheezing. The symptoms are aggravated by exercise. She has tried a beta-agonist inhaler and ipratropium inhaler for the symptoms. The treatment provided no relief. Her past medical history is significant for COPD.    Lab Results  Component Value Date   CREATININE 0.80 01/18/2019   BUN 9 01/18/2019   NA 141 01/18/2019   K 4.5 01/18/2019   CL 100 01/18/2019   CO2 26 01/18/2019   Lab Results  Component Value Date   CHOL 270 (H) 01/18/2019   HDL 87 01/18/2019   LDLCALC 157 (H)  01/18/2019   TRIG 131 01/18/2019   CHOLHDL 3.1 01/18/2019   Lab Results  Component Value Date   TSH 1.690 01/18/2019   No results found for: HGBA1C   Review of Systems  Constitutional: Negative for chills.  HENT: Positive for congestion and sneezing. Negative for sinus pressure and trouble swallowing.   Respiratory: Positive for cough (with yellow/green mucus). Negative for shortness of breath and wheezing.   Cardiovascular: Negative for chest pain and palpitations.  Neurological: Positive for headaches. Negative for dizziness, light-headedness and numbness.    Patient Active Problem List   Diagnosis Date Noted  . Hypercalcemia 07/10/2017  . Stage 2 moderate COPD by GOLD classification ([place]) 01/07/2017  . Ganglion cyst of wrist 07/02/2015  . Intermittent palpitations 07/02/2015  . Degenerative arthritis of spine with cord compression 07/02/2015  . Abdominal bloating 07/02/2015  . Baker's cyst of knee 07/02/2015  . Hyperlipidemia, mild 07/02/2015    Allergies  Allergen Reactions  . Sulfa Antibiotics Other (See Comments)    It was a long time ago, cannot remember reaction.    Past Surgical History:  Procedure Laterality Date  . ABDOMINAL HYSTERECTOMY    . CESAREAN SECTION    . COLONOSCOPY N/A 03/03/2015   Procedure: COLONOSCOPY;  Surgeon: Lucilla Lame, MD;  Location: [place];  Service: Gastroenterology;  Laterality: N/A;  . POLYPECTOMY  03/03/2015   Procedure: POLYPECTOMY INTESTINAL;  Surgeon: Lucilla Lame, MD;  Location: [place];  Service: Gastroenterology;;  . TUBAL LIGATION      Social History   Tobacco Use  . Smoking status:  Former Smoker    Packs/day: 1.50    Years: 15.00    Pack years: 22.50    Types: Cigarettes    Quit date: 2012    Years since quitting: 8.9  . Smokeless tobacco: Never Used  Substance Use Topics  . Alcohol use: No    Alcohol/week: 0.0 standard drinks  . Drug use: No     Medication list has been reviewed and  updated.  Current Meds  Medication Sig  . albuterol (PROVENTIL HFA;VENTOLIN HFA) 108 (90 Base) MCG/ACT inhaler Inhale 2 puffs into the lungs every 6 (six) hours as needed for wheezing or shortness of breath.  . Ascorbic Acid (VITAMIN C) 1000 MG tablet Take 1,000 mg by mouth every other day.   . Cholecalciferol (D3-50) 50000 units capsule Take 5,000 Units by mouth daily.  . Multiple Vitamins-Minerals (CENTRUM SILVER 50+WOMEN PO) Take 1 tablet by mouth daily.   . Omega-3 Fatty Acids (FISH OIL) 1000 MG CAPS Take 1,000 mg by mouth daily.   . Tiotropium Bromide Monohydrate (SPIRIVA RESPIMAT) 2.5 MCG/ACT AERS Inhale 2 puffs into the lungs every morning.  . zinc gluconate 50 MG tablet Take 50 mg by mouth daily.    PHQ 2/9 Scores 09/07/2019 07/19/2019 06/29/2019 01/18/2019  PHQ - 2 Score 0 0 0 0  PHQ- 9 Score 0 - - -    BP Readings from Last 3 Encounters:  01/18/19 112/68  11/13/18 122/70  11/08/18 125/77    Physical Exam Pulmonary:     Comments: Patient sounds slightly breathless while speaking and there is frequent loose cough noted.  No apparent wheezing. Neurological:     Mental Status: She is alert.  Psychiatric:        Attention and Perception: Attention normal.        Mood and Affect: Mood normal.        Speech: Speech normal.        Cognition and Memory: Cognition normal.     Wt Readings from Last 3 Encounters:  09/07/19 133 lb (60.3 kg)  07/19/19 133 lb (60.3 kg)  06/29/19 134 lb (60.8 kg)    Temp (!) 96.8 F (36 C) (Oral)   Ht 5\' 3"  (1.6 m)   Wt 133 lb (60.3 kg)   BMI 23.56 kg/m   Assessment and Plan: 1. COPD with acute exacerbation (HCC) Continue fluids, albuterol and spiriva inhalers Theraflu otc if helpful - doxycycline (VIBRA-TABS) 100 MG tablet; Take 1 tablet (100 mg total) by mouth 2 (two) times daily for 10 days.  Dispense: 20 tablet; Refill: 0 - predniSONE (DELTASONE) 10 MG tablet; Take 1 tablet (10 mg total) by mouth as directed for 6 days. Take  6,5,4,3,2,1 then stop  Dispense: 21 tablet; Refill: 0   Partially dictated using Editor, commissioning. Any errors are unintentional.  Halina Maidens, MD [facility]  09/07/2019

## 2019-09-07 NOTE — Telephone Encounter (Signed)
Pt called saying she is having cough with facial congestion but no fever. Pls call pt back and schedule her for a Telephone visit at 11AM this morning. Ask her to take her Tempeture and Blood Pressure before 1030 if she is able.  Thank you.  CB# [phone]

## 2019-09-13 ENCOUNTER — Telehealth: Payer: Self-pay | Admitting: Internal Medicine

## 2019-09-13 NOTE — Telephone Encounter (Signed)
See previous note*  Patient struggling with sleep due to consistent cough. Anything else we can prescribe for this? She is currently taking prednisone taper and doxycycline.  Thank you.

## 2019-09-13 NOTE — Telephone Encounter (Signed)
Patient had a phone visit  on 09/07/19 she still is finishing up with antibiotics, but can not sleep with consistent cough.

## 2019-09-13 NOTE — Telephone Encounter (Signed)
She can try Delsym over the counter.

## 2019-09-13 NOTE — Telephone Encounter (Signed)
Patient informed. She will try delsym and callback if no improvement.

## 2019-11-05 DIAGNOSIS — Z01818 Encounter for other preprocedural examination: Secondary | ICD-10-CM | POA: Diagnosis not present

## 2019-11-09 DIAGNOSIS — R0600 Dyspnea, unspecified: Secondary | ICD-10-CM | POA: Diagnosis not present

## 2019-11-09 DIAGNOSIS — J449 Chronic obstructive pulmonary disease, unspecified: Secondary | ICD-10-CM | POA: Diagnosis not present

## 2020-01-19 ENCOUNTER — Other Ambulatory Visit: Payer: Self-pay

## 2020-01-19 ENCOUNTER — Ambulatory Visit (INDEPENDENT_AMBULATORY_CARE_PROVIDER_SITE_OTHER): Payer: PPO | Admitting: Internal Medicine

## 2020-01-19 ENCOUNTER — Encounter: Payer: Self-pay | Admitting: Internal Medicine

## 2020-01-19 VITALS — BP 120/78 | HR 71 | Temp 97.9°F | Ht 63.0 in | Wt 130.0 lb

## 2020-01-19 DIAGNOSIS — Z1231 Encounter for screening mammogram for malignant neoplasm of breast: Secondary | ICD-10-CM | POA: Diagnosis not present

## 2020-01-19 DIAGNOSIS — J449 Chronic obstructive pulmonary disease, unspecified: Secondary | ICD-10-CM | POA: Diagnosis not present

## 2020-01-19 DIAGNOSIS — Z13820 Encounter for screening for osteoporosis: Secondary | ICD-10-CM | POA: Diagnosis not present

## 2020-01-19 DIAGNOSIS — Z0000 Encounter for general adult medical examination without abnormal findings: Secondary | ICD-10-CM | POA: Diagnosis not present

## 2020-01-19 DIAGNOSIS — E785 Hyperlipidemia, unspecified: Secondary | ICD-10-CM

## 2020-01-19 LAB — POCT URINALYSIS DIPSTICK
Bilirubin, UA: NEGATIVE
Blood, UA: NEGATIVE
Glucose, UA: NEGATIVE
Ketones, UA: NEGATIVE
Leukocytes, UA: NEGATIVE
Nitrite, UA: NEGATIVE
Protein, UA: NEGATIVE
Spec Grav, UA: 1.01 (ref 1.010–1.025)
Urobilinogen, UA: 0.2 E.U./dL
pH, UA: 5 (ref 5.0–8.0)

## 2020-01-19 NOTE — Progress Notes (Signed)
Date:  01/19/2020   Name:  Eileen Mills   DOB:  [birth year]   MRN:  [id number]   Chief Complaint: Annual Exam (Breast Exam, and mammo. DEXA. ) Debra Guia is a 69 y.o. female who presents today for her Complete Annual Exam. She feels well. She reports exercising walking some. She reports she is sleeping well. She denies breast issues.  Colonoscopy  2016 DEXA - none Mammogram  03/2019 Immunization History  Administered Date(s) Administered  . Influenza Inj Mdck Quad Pf 06/23/2018  . Influenza, High Dose Seasonal PF 07/17/2019  . Influenza,inj,Quad PF,6+ Mos 07/07/2015, 07/10/2017  . Influenza-Unspecified 06/30/2016, 07/17/2019  . Pneumococcal Conjugate-13 01/18/2019  . Pneumococcal Polysaccharide-23 01/07/2017  . Tdap 01/05/2016  . Zoster 12/22/2013    HPI COPD - 22 pack -yr hx; quit in 2012.  Has had lung cancer screening.  Doing well on Spiriva.  Uses albuterol several times a month.  The cancer will contact her this year.  Lab Results  Component Value Date   CREATININE 0.80 01/18/2019   BUN 9 01/18/2019   NA 141 01/18/2019   K 4.5 01/18/2019   CL 100 01/18/2019   CO2 26 01/18/2019   Lab Results  Component Value Date   CHOL 270 (H) 01/18/2019   HDL 87 01/18/2019   LDLCALC 157 (H) 01/18/2019   TRIG 131 01/18/2019   CHOLHDL 3.1 01/18/2019   Lab Results  Component Value Date   TSH 1.690 01/18/2019   No results found for: HGBA1C Lab Results  Component Value Date   WBC 8.4 01/18/2019   HGB 13.9 01/18/2019   HCT 42.2 01/18/2019   MCV 90 01/18/2019   PLT 345 01/18/2019   Lab Results  Component Value Date   ALT 30 01/18/2019   AST 23 01/18/2019   ALKPHOS 94 01/18/2019   BILITOT 0.3 01/18/2019     Review of Systems  Constitutional: Negative for chills, fatigue and fever.  HENT: Negative for congestion, hearing loss, tinnitus, trouble swallowing and voice change.   Eyes: Negative for visual disturbance.  Respiratory: Positive for  shortness of breath. Negative for cough, chest tightness and wheezing.   Cardiovascular: Negative for chest pain, palpitations and leg swelling.  Gastrointestinal: Negative for abdominal pain, constipation, diarrhea and vomiting.  Endocrine: Negative for polydipsia and polyuria.  Genitourinary: Negative for dysuria, frequency, genital sores, vaginal bleeding and vaginal discharge.  Musculoskeletal: Negative for arthralgias, gait problem and joint swelling.  Skin: Negative for color change and rash.  Allergic/Immunologic: Positive for environmental allergies.  Neurological: Negative for dizziness, tremors, light-headedness and headaches.  Hematological: Negative for adenopathy. Does not bruise/bleed easily.  Psychiatric/Behavioral: Negative for dysphoric mood and sleep disturbance. The patient is not nervous/anxious.     Patient Active Problem List   Diagnosis Date Noted  . Hypercalcemia 07/10/2017  . Stage 2 moderate COPD by GOLD classification ([place]) 01/07/2017  . Ganglion cyst of wrist 07/02/2015  . Intermittent palpitations 07/02/2015  . Degenerative arthritis of spine with cord compression 07/02/2015  . Abdominal bloating 07/02/2015  . Baker's cyst of knee 07/02/2015  . Hyperlipidemia, mild 07/02/2015    Allergies  Allergen Reactions  . Sulfa Antibiotics Other (See Comments)    It was a long time ago, cannot remember reaction.    Past Surgical History:  Procedure Laterality Date  . ABDOMINAL HYSTERECTOMY    . CESAREAN SECTION    . COLONOSCOPY N/A 03/03/2015   Procedure: COLONOSCOPY;  Surgeon: Lucilla Lame, MD;  Location: [facility];  Service: Gastroenterology;  Laterality: N/A;  . POLYPECTOMY  03/03/2015   Procedure: POLYPECTOMY INTESTINAL;  Surgeon: Lucilla Lame, MD;  Location: [place];  Service: Gastroenterology;;  . TUBAL LIGATION      Social History   Tobacco Use  . Smoking status: Former Smoker    Packs/day: 1.50    Years: 15.00    Pack years:  22.50    Types: Cigarettes    Quit date: 2012    Years since quitting: 9.3  . Smokeless tobacco: Never Used  Substance Use Topics  . Alcohol use: No    Alcohol/week: 0.0 standard drinks  . Drug use: No     Medication list has been reviewed and updated.  Current Meds  Medication Sig  . albuterol (PROVENTIL HFA;VENTOLIN HFA) 108 (90 Base) MCG/ACT inhaler Inhale 2 puffs into the lungs every 6 (six) hours as needed for wheezing or shortness of breath.  . Ascorbic Acid (VITAMIN C) 1000 MG tablet Take 1,000 mg by mouth every other day.   . Cholecalciferol (D3-50) 50000 units capsule Take 5,000 Units by mouth daily.  . Multiple Vitamins-Minerals (CENTRUM SILVER 50+WOMEN PO) Take 1 tablet by mouth daily.   . Omega-3 Fatty Acids (FISH OIL) 1000 MG CAPS Take 1,000 mg by mouth daily.   . Tiotropium Bromide Monohydrate (SPIRIVA RESPIMAT) 2.5 MCG/ACT AERS Inhale 2 puffs into the lungs every morning.  . zinc gluconate 50 MG tablet Take 50 mg by mouth daily.    PHQ 2/9 Scores 01/19/2020 09/07/2019 07/19/2019 06/29/2019  PHQ - 2 Score 0 0 0 0  PHQ- 9 Score 0 0 - -    BP Readings from Last 3 Encounters:  01/19/20 120/78  01/18/19 112/68  11/13/18 122/70    Physical Exam Vitals and nursing note reviewed.  Constitutional:      General: She is not in acute distress.    Appearance: She is well-developed.  HENT:     Head: Normocephalic and atraumatic.     Right Ear: Tympanic membrane and ear canal normal.     Left Ear: Tympanic membrane and ear canal normal.     Nose:     Right Sinus: No maxillary sinus tenderness.     Left Sinus: No maxillary sinus tenderness.  Eyes:     General: No scleral icterus.       Right eye: No discharge.        Left eye: No discharge.     Conjunctiva/sclera: Conjunctivae normal.  Neck:     Thyroid: No thyromegaly.     Vascular: No carotid bruit.  Cardiovascular:     Rate and Rhythm: Normal rate and regular rhythm.     Pulses: Normal pulses.     Heart  sounds: Normal heart sounds.  Pulmonary:     Effort: Pulmonary effort is normal. No respiratory distress.     Breath sounds: No wheezing or rhonchi.  Chest:     Breasts:        Right: No mass, nipple discharge, skin change or tenderness.        Left: No mass, nipple discharge, skin change or tenderness.  Abdominal:     General: Bowel sounds are normal.     Palpations: Abdomen is soft.     Tenderness: There is no abdominal tenderness.  Musculoskeletal:        General: Normal range of motion.     Cervical back: Normal range of motion. No erythema.     Right lower leg: No  edema.     Left lower leg: No edema.  Lymphadenopathy:     Cervical: No cervical adenopathy.  Skin:    General: Skin is warm and dry.     Capillary Refill: Capillary refill takes less than 2 seconds.     Findings: No rash.  Neurological:     General: No focal deficit present.     Mental Status: She is alert and oriented to person, place, and time.     Cranial Nerves: No cranial nerve deficit.     Sensory: No sensory deficit.     Deep Tendon Reflexes: Reflexes are normal and symmetric.  Psychiatric:        Speech: Speech normal.        Behavior: Behavior normal.        Thought Content: Thought content normal.     Wt Readings from Last 3 Encounters:  01/19/20 130 lb (59 kg)  09/07/19 133 lb (60.3 kg)  07/19/19 133 lb (60.3 kg)    BP 120/78   Pulse 71   Temp 97.9 F (36.6 C) (Oral)   Ht 5\' 3"  (1.6 m)   Wt 130 lb (59 kg)   SpO2 96%   BMI 23.03 kg/m   Assessment and Plan: 1. Annual physical exam Normal exam Continue healthy diet, regular exercise - CBC with Differential/Platelet - Comprehensive metabolic panel - TSH - POCT urinalysis dipstick  2. Encounter for screening mammogram for breast cancer Schedule at [facility]; Future  3. Encounter for screening for osteoporosis Will schedule and pt will check on coverage - DG Bone Density; Future  4. Stage 2 moderate  COPD by GOLD classification ([place]) Stable mild SOB well compensated by Spriva. Rare use of Albuterol MDI Will continue annual LDCT lung cancer screening  5. Hyperlipidemia, mild Check labs and advise - Lipid panel   Partially dictated using Dragon software. Any errors are unintentional.  Halina Maidens, MD [facility]  01/19/2020

## 2020-01-20 LAB — CBC WITH DIFFERENTIAL/PLATELET
Basophils Absolute: 0.1 10*3/uL (ref 0.0–0.2)
Basos: 1 %
EOS (ABSOLUTE): 0.2 10*3/uL (ref 0.0–0.4)
Eos: 2 %
Hematocrit: 40.7 % (ref 34.0–46.6)
Hemoglobin: 14 g/dL (ref 11.1–15.9)
Immature Grans (Abs): 0 10*3/uL (ref 0.0–0.1)
Immature Granulocytes: 0 %
Lymphocytes Absolute: 2.4 10*3/uL (ref 0.7–3.1)
Lymphs: 30 %
MCH: 31.2 pg (ref 26.6–33.0)
MCHC: 34.4 g/dL (ref 31.5–35.7)
MCV: 91 fL (ref 79–97)
Monocytes Absolute: 0.5 10*3/uL (ref 0.1–0.9)
Monocytes: 7 %
Neutrophils Absolute: 4.7 10*3/uL (ref 1.4–7.0)
Neutrophils: 60 %
Platelets: 365 10*3/uL (ref 150–450)
RBC: 4.49 x10E6/uL (ref 3.77–5.28)
RDW: 12.6 % (ref 11.7–15.4)
WBC: 7.9 10*3/uL (ref 3.4–10.8)

## 2020-01-20 LAB — COMPREHENSIVE METABOLIC PANEL
ALT: 19 IU/L (ref 0–32)
AST: 18 IU/L (ref 0–40)
Albumin/Globulin Ratio: 2.6 — ABNORMAL HIGH (ref 1.2–2.2)
Albumin: 4.6 g/dL (ref 3.8–4.8)
Alkaline Phosphatase: 84 IU/L (ref 39–117)
BUN/Creatinine Ratio: 12 (ref 12–28)
BUN: 10 mg/dL (ref 8–27)
Bilirubin Total: 0.2 mg/dL (ref 0.0–1.2)
CO2: 25 mmol/L (ref 20–29)
Calcium: 10.3 mg/dL (ref 8.7–10.3)
Chloride: 102 mmol/L (ref 96–106)
Creatinine, Ser: 0.82 mg/dL (ref 0.57–1.00)
GFR calc Af Amer: 86 mL/min/{1.73_m2} (ref >59)
GFR calc non Af Amer: 75 mL/min/{1.73_m2} (ref >59)
Globulin, Total: 1.8 g/dL (ref 1.5–4.5)
Glucose: 94 mg/dL (ref 65–99)
Potassium: 4.7 mmol/L (ref 3.5–5.2)
Sodium: 141 mmol/L (ref 134–144)
Total Protein: 6.4 g/dL (ref 6.0–8.5)

## 2020-01-20 LAB — LIPID PANEL
Chol/HDL Ratio: 3.5 ratio (ref 0.0–4.4)
Cholesterol, Total: 261 mg/dL — ABNORMAL HIGH (ref 100–199)
HDL: 75 mg/dL (ref >39)
LDL Chol Calc (NIH): 155 mg/dL — ABNORMAL HIGH (ref 0–99)
Triglycerides: 172 mg/dL — ABNORMAL HIGH (ref 0–149)
VLDL Cholesterol Cal: 31 mg/dL (ref 5–40)

## 2020-01-20 LAB — TSH: TSH: 1.57 u[IU]/mL (ref 0.450–4.500)

## 2020-02-21 ENCOUNTER — Telehealth: Payer: Self-pay

## 2020-02-21 DIAGNOSIS — Z87891 Personal history of nicotine dependence: Secondary | ICD-10-CM

## 2020-02-21 DIAGNOSIS — Z122 Encounter for screening for malignant neoplasm of respiratory organs: Secondary | ICD-10-CM

## 2020-02-21 NOTE — Telephone Encounter (Signed)
Patient has been notified that the low dose lung cancer screening CT scan is due currently or will be in near future.  Confirmed that patient is within the appropriate age range and asymptomatic, (no signs or symptoms of lung cancer).  Patient denies illness that would prevent curative treatment for lung cancer if found.  Patient is agreeable for CT scan being scheduled.    Verified smoking history (former smoker, quit 2012 with 15 year history).   CT scan is scheduled for 03/03/20 @ 8:00.

## 2020-02-23 NOTE — Addendum Note (Signed)
Addended by: Lieutenant Diego on: 02/23/2020 01:37 PM   Modules accepted: Orders

## 2020-02-23 NOTE — Telephone Encounter (Signed)
Smoking history: former, quit 09/30/10, 45 pack year

## 2020-03-03 ENCOUNTER — Ambulatory Visit: Admission: RE | Admit: 2020-03-03 | Payer: PPO | Source: Ambulatory Visit

## 2020-03-28 ENCOUNTER — Other Ambulatory Visit: Payer: Self-pay | Admitting: *Deleted

## 2020-03-28 DIAGNOSIS — Z87891 Personal history of nicotine dependence: Secondary | ICD-10-CM

## 2020-03-29 ENCOUNTER — Ambulatory Visit: Payer: PPO

## 2020-03-29 ENCOUNTER — Ambulatory Visit
Admission: RE | Admit: 2020-03-29 | Discharge: 2020-03-29 | Disposition: A | Discharge status: Home / Self Care | Payer: PPO | Source: Ambulatory Visit | Attending: Oncology | Admitting: Oncology

## 2020-03-29 ENCOUNTER — Other Ambulatory Visit: Payer: Self-pay

## 2020-03-29 DIAGNOSIS — Z87891 Personal history of nicotine dependence: Secondary | ICD-10-CM | POA: Diagnosis not present

## 2020-03-30 ENCOUNTER — Encounter: Payer: Self-pay | Admitting: Internal Medicine

## 2020-03-30 DIAGNOSIS — I700 Atherosclerosis of aorta: Secondary | ICD-10-CM | POA: Insufficient documentation

## 2020-03-30 DIAGNOSIS — M533 Sacrococcygeal disorders, not elsewhere classified: Secondary | ICD-10-CM | POA: Diagnosis not present

## 2020-03-30 DIAGNOSIS — M545 Low back pain: Secondary | ICD-10-CM | POA: Diagnosis not present

## 2020-03-31 ENCOUNTER — Encounter: Payer: Self-pay | Admitting: *Deleted

## 2020-04-07 ENCOUNTER — Other Ambulatory Visit: Payer: Self-pay

## 2020-04-07 DIAGNOSIS — E785 Hyperlipidemia, unspecified: Secondary | ICD-10-CM

## 2020-04-07 MED ORDER — ATORVASTATIN CALCIUM 10 MG PO TABS: 10.0000 mg | ORAL_TABLET | Freq: Every day | ORAL | 1 refills | Status: DC

## 2020-04-07 NOTE — Progress Notes (Signed)
Sent in [place]

## 2020-04-14 ENCOUNTER — Ambulatory Visit: Payer: PPO | Attending: Internal Medicine

## 2020-04-14 ENCOUNTER — Ambulatory Visit: Payer: PPO

## 2020-04-14 DIAGNOSIS — Z23 Encounter for immunization: Secondary | ICD-10-CM

## 2020-04-14 NOTE — Progress Notes (Signed)
   Covid-19 Vaccination Clinic  Name:  Eileen Mills    MRN: [id number] DOB: 10/17/1950  04/14/2020  [name] was observed post Covid-19 immunization for 15 minutes without incident. She was provided with Vaccine Information Sheet and instruction to access the V-Safe system.   [name] was instructed to call 911 with any severe reactions post vaccine: Marland Kitchen Difficulty breathing  . Swelling of face and throat  . A fast heartbeat  . A bad rash all over body  . Dizziness and weakness   Immunizations Administered    Name Date Dose VIS Date Route   Pfizer COVID-19 Vaccine 04/14/2020  9:59 AM 0.3 mL 11/24/2018 Intramuscular   Manufacturer: [place]   Lot: [id number]   [place]: [address]-2

## 2020-04-26 ENCOUNTER — Other Ambulatory Visit: Payer: PPO

## 2020-05-08 ENCOUNTER — Ambulatory Visit: Payer: PPO

## 2020-05-09 DIAGNOSIS — J449 Chronic obstructive pulmonary disease, unspecified: Secondary | ICD-10-CM | POA: Diagnosis not present

## 2020-05-09 DIAGNOSIS — R0600 Dyspnea, unspecified: Secondary | ICD-10-CM | POA: Diagnosis not present

## 2020-05-15 ENCOUNTER — Ambulatory Visit: Payer: PPO | Attending: Internal Medicine

## 2020-05-15 DIAGNOSIS — Z23 Encounter for immunization: Secondary | ICD-10-CM

## 2020-05-15 NOTE — Progress Notes (Signed)
   Covid-19 Vaccination Clinic  Name:  Eileen Mills    MRN: [id number] DOB: 03-03-1951  05/15/2020  [name] was observed post Covid-19 immunization for 15 minutes without incident. She was provided with Vaccine Information Sheet and instruction to access the V-Safe system.   [name] was instructed to call 911 with any severe reactions post vaccine: Marland Kitchen Difficulty breathing  . Swelling of face and throat  . A fast heartbeat  . A bad rash all over body  . Dizziness and weakness   Immunizations Administered    Name Date Dose VIS Date Route   Pfizer COVID-19 Vaccine 05/15/2020  9:59 AM 0.3 mL 11/24/2018 Intramuscular   Manufacturer: [employer], [employer]   Lot: [id number]   [place]: [address]-9

## 2020-06-13 DIAGNOSIS — L821 Other seborrheic keratosis: Secondary | ICD-10-CM | POA: Diagnosis not present

## 2020-06-13 DIAGNOSIS — L304 Erythema intertrigo: Secondary | ICD-10-CM | POA: Diagnosis not present

## 2020-06-27 ENCOUNTER — Other Ambulatory Visit: Payer: Self-pay

## 2020-06-27 ENCOUNTER — Ambulatory Visit
Admission: RE | Admit: 2020-06-27 | Discharge: 2020-06-27 | Disposition: A | Discharge status: Home / Self Care | Payer: PPO | Source: Ambulatory Visit | Attending: Internal Medicine | Admitting: Internal Medicine

## 2020-06-27 DIAGNOSIS — Z1231 Encounter for screening mammogram for malignant neoplasm of breast: Secondary | ICD-10-CM

## 2020-07-17 ENCOUNTER — Ambulatory Visit: Payer: PPO

## 2020-07-26 ENCOUNTER — Ambulatory Visit (INDEPENDENT_AMBULATORY_CARE_PROVIDER_SITE_OTHER): Payer: PPO

## 2020-07-26 DIAGNOSIS — Z780 Asymptomatic menopausal state: Secondary | ICD-10-CM | POA: Diagnosis not present

## 2020-07-26 DIAGNOSIS — Z0000 Encounter for general adult medical examination without abnormal findings: Secondary | ICD-10-CM | POA: Diagnosis not present

## 2020-07-26 NOTE — Patient Instructions (Signed)
Eileen Mills , Thank you for taking time to come for your Medicare Wellness Visit. I appreciate your ongoing commitment to your health goals. Please review the following plan we discussed and let me know if I can assist you in the future.   Screening recommendations/referrals: Colonoscopy: done 03/03/15. Due 2026 Mammogram: done 06/27/20 Bone Density: due - Please call [phone] to schedule your bone density screening.  Recommended yearly ophthalmology/optometry visit for glaucoma screening and checkup Recommended yearly dental visit for hygiene and checkup  Vaccinations: Influenza vaccine: done 07/16/20 Pneumococcal vaccine: done 01/18/19 Tdap vaccine: done 01/05/16 Shingles vaccine: Shingrix discussed. Please contact your pharmacy for coverage information.  Covid-19: done 04/14/20 & 05/15/20  Advanced directives: Please bring a copy of your health care power of attorney and living will to the office at your convenience.  Conditions/risks identified:   Free hearing clinics offered in [place]:   [facility] [address], [place], [place] [address] [phone]  [facility], [place], [place] [address] [phone]   Next appointment: Follow up in one year for your annual wellness visit    Preventive Care 65 Years and Older, Female Preventive care refers to lifestyle choices and visits with your health care provider that can promote health and wellness. What does preventive care include?  A yearly physical exam. This is also called an annual well check.  Dental exams once or twice a year.  Routine eye exams. Ask your health care provider how often you should have your eyes checked.  Personal lifestyle choices, including:  Daily care of your teeth and gums.  Regular physical activity.  Eating a healthy diet.  Avoiding tobacco and drug use.  Limiting alcohol use.  Practicing safe sex.  Taking low-dose aspirin every  day.  Taking vitamin and mineral supplements as recommended by your health care provider. What happens during an annual well check? The services and screenings done by your health care provider during your annual well check will depend on your age, overall health, lifestyle risk factors, and family history of disease. Counseling  Your health care provider may ask you questions about your:  Alcohol use.  Tobacco use.  Drug use.  Emotional well-being.  Home and relationship well-being.  Sexual activity.  Eating habits.  History of falls.  Memory and ability to understand (cognition).  Work and work Statistician.  Reproductive health. Screening  You may have the following tests or measurements:  Height, weight, and BMI.  Blood pressure.  Lipid and cholesterol levels. These may be checked every 5 years, or more frequently if you are over 64 years old.  Skin check.  Lung cancer screening. You may have this screening every year starting at age 51 if you have a 30-pack-year history of smoking and currently smoke or have quit within the past 15 years.  Fecal occult blood test (FOBT) of the stool. You may have this test every year starting at age 55.  Flexible sigmoidoscopy or colonoscopy. You may have a sigmoidoscopy every 5 years or a colonoscopy every 10 years starting at age 7.  Hepatitis C blood test.  Hepatitis B blood test.  Sexually transmitted disease (STD) testing.  Diabetes screening. This is done by checking your blood sugar (glucose) after you have not eaten for a while (fasting). You may have this done every 1-3 years.  Bone density scan. This is done to screen for osteoporosis. You may have this done starting at age 36.  Mammogram. This may be done every 1-2 years. Talk to your health care provider about how often you should have regular mammograms. Talk with your health care provider about your test results, treatment options, and if necessary, the need  for more tests. Vaccines  Your health care provider may recommend certain vaccines, such as:  Influenza vaccine. This is recommended every year.  Tetanus, diphtheria, and acellular pertussis (Tdap, Td) vaccine. You may need a Td booster every 10 years.  Zoster vaccine. You may need this after age 26.  Pneumococcal 13-valent conjugate (PCV13) vaccine. One dose is recommended after age 28.  Pneumococcal polysaccharide (PPSV23) vaccine. One dose is recommended after age 72. Talk to your health care provider about which screenings and vaccines you need and how often you need them. This information is not intended to replace advice given to you by your health care provider. Make sure you discuss any questions you have with your health care provider. Document Released: 10/13/2015 Document Revised: 06/05/2016 Document Reviewed: 07/18/2015 Elsevier Interactive Patient Education  2017 [employer] Prevention in the Home Falls can cause injuries. They can happen to people of all ages. There are many things you can do to make your home safe and to help prevent falls. What can I do on the outside of my home?  Regularly fix the edges of walkways and driveways and fix any cracks.  Remove anything that might make you trip as you walk through a door, such as a raised step or threshold.  Trim any bushes or trees on the path to your home.  Use bright outdoor lighting.  Clear any walking paths of anything that might make someone trip, such as rocks or tools.  Regularly check to see if handrails are loose or broken. Make sure that both sides of any steps have handrails.  Any raised decks and porches should have guardrails on the edges.  Have any leaves, snow, or ice cleared regularly.  Use sand or salt on walking paths during winter.  Clean up any spills in your garage right away. This includes oil or grease spills. What can I do in the bathroom?  Use night lights.  Install grab bars  by the toilet and in the tub and shower. Do not use towel bars as grab bars.  Use non-skid mats or decals in the tub or shower.  If you need to sit down in the shower, use a plastic, non-slip stool.  Keep the floor dry. Clean up any water that spills on the floor as soon as it happens.  Remove soap buildup in the tub or shower regularly.  Attach bath mats securely with double-sided non-slip rug tape.  Do not have throw rugs and other things on the floor that can make you trip. What can I do in the bedroom?  Use night lights.  Make sure that you have a light by your bed that is easy to reach.  Do not use any sheets or blankets that are too big for your bed. They should not hang down onto the floor.  Have a firm chair that has side arms. You can use this for support while you get dressed.  Do not have throw rugs and other things on the floor that can make you trip. What can I do in the kitchen?  Clean up any spills right away.  Avoid walking on wet floors.  Keep items that you use a lot in easy-to-reach places.  If you need to reach  something above you, use a strong step stool that has a grab bar.  Keep electrical cords out of the way.  Do not use floor polish or wax that makes floors slippery. If you must use wax, use non-skid floor wax.  Do not have throw rugs and other things on the floor that can make you trip. What can I do with my stairs?  Do not leave any items on the stairs.  Make sure that there are handrails on both sides of the stairs and use them. Fix handrails that are broken or loose. Make sure that handrails are as long as the stairways.  Check any carpeting to make sure that it is firmly attached to the stairs. Fix any carpet that is loose or worn.  Avoid having throw rugs at the top or bottom of the stairs. If you do have throw rugs, attach them to the floor with carpet tape.  Make sure that you have a light switch at the top of the stairs and the  bottom of the stairs. If you do not have them, ask someone to add them for you. What else can I do to help prevent falls?  Wear shoes that:  Do not have high heels.  Have rubber bottoms.  Are comfortable and fit you well.  Are closed at the toe. Do not wear sandals.  If you use a stepladder:  Make sure that it is fully opened. Do not climb a closed stepladder.  Make sure that both sides of the stepladder are locked into place.  Ask someone to hold it for you, if possible.  Clearly mark and make sure that you can see:  Any grab bars or handrails.  First and last steps.  Where the edge of each step is.  Use tools that help you move around (mobility aids) if they are needed. These include:  Canes.  Walkers.  Scooters.  Crutches.  Turn on the lights when you go into a dark area. Replace any light bulbs as soon as they burn out.  Set up your furniture so you have a clear path. Avoid moving your furniture around.  If any of your floors are uneven, fix them.  If there are any pets around you, be aware of where they are.  Review your medicines with your doctor. Some medicines can make you feel dizzy. This can increase your chance of falling. Ask your doctor what other things that you can do to help prevent falls. This information is not intended to replace advice given to you by your health care provider. Make sure you discuss any questions you have with your health care provider. Document Released: 07/13/2009 Document Revised: 02/22/2016 Document Reviewed: 10/21/2014 Elsevier Interactive Patient Education  2017 [employer].

## 2020-07-26 NOTE — Progress Notes (Signed)
Subjective:   Eileen Mills is a 69 y.o. female who presents for Medicare Annual (Subsequent) preventive examination.  Virtual Visit via Telephone Note  I connected with  [name] on 07/26/20 at  9:20 AM EDT by telephone and verified that I am speaking with the correct person using two identifiers.  Medicare Annual Wellness visit completed telephonically due to Covid-19 pandemic.   Location: Patient: home Provider: [facility]   I discussed the limitations, risks, security and privacy concerns of performing an evaluation and management service by telephone and the availability of in person appointments. The patient expressed understanding and agreed to proceed.  Unable to perform video visit due to video visit attempted and failed and/or patient does not have video capability.   Some vital signs may be absent or patient reported.   Clemetine Marker, LPN    Review of Systems     Cardiac Risk Factors include: advanced age (>71men, >19 women);dyslipidemia     Objective:    There were no vitals filed for this visit. There is no height or weight on file to calculate BMI.  Advanced Directives 07/26/2020 07/19/2019 11/08/2018 10/28/2018 01/19/2016 01/05/2016 10/13/2015  Does Patient Have a Medical Advance Directive? Yes Yes No No Yes Yes Yes  Type of Paramedic of [place];Living will [place];Living will - - Living will Living will [place];Living will  Copy of [place] in Chart? No - copy requested No - copy requested - - No - copy requested - -  Would patient like information on creating a medical advance directive? - - No - Patient declined No - Patient declined No - patient declined information - -    Current Medications (verified) Outpatient Encounter Medications as of 07/26/2020  Medication Sig   albuterol (PROVENTIL HFA;VENTOLIN HFA) 108 (90 Base) MCG/ACT inhaler Inhale 2 puffs  into the lungs every 6 (six) hours as needed for wheezing or shortness of breath.   Ascorbic Acid (VITAMIN C) 1000 MG tablet Take 1,000 mg by mouth every other day.    atorvastatin (LIPITOR) 10 MG tablet Take 1 tablet (10 mg total) by mouth daily.   Cholecalciferol (D3-50) 50000 units capsule Take 5,000 Units by mouth daily.   Multiple Vitamins-Minerals (CENTRUM SILVER 50+WOMEN PO) Take 1 tablet by mouth daily.    Omega-3 Fatty Acids (FISH OIL) 1000 MG CAPS Take 1,000 mg by mouth daily.    Tiotropium Bromide Monohydrate (SPIRIVA RESPIMAT) 2.5 MCG/ACT AERS Inhale 2 puffs into the lungs every morning.   zinc gluconate 50 MG tablet Take 50 mg by mouth daily.   No facility-administered encounter medications on file as of 07/26/2020.    Allergies (verified) Sulfa antibiotics   History: Past Medical History:  Diagnosis Date   Arthritis    Cataract    COPD (chronic obstructive pulmonary disease) ([place])    Emphysema of lung ([place])    Hyperlipidemia    Past Surgical History:  Procedure Laterality Date   ABDOMINAL HYSTERECTOMY     CESAREAN SECTION     COLONOSCOPY N/A 03/03/2015   Procedure: COLONOSCOPY;  Surgeon: Lucilla Lame, MD;  Location: [facility];  Service: Gastroenterology;  Laterality: N/A;   POLYPECTOMY  03/03/2015   Procedure: POLYPECTOMY INTESTINAL;  Surgeon: Lucilla Lame, MD;  Location: [facility];  Service: Gastroenterology;;   TUBAL LIGATION     Family History  Problem Relation Age of Onset   Alzheimer's disease Mother    CAD Father  Breast cancer Neg Hx    Social History   Socioeconomic History   Marital status: Widowed    Spouse name: Not on file   Number of children: 4   Years of education: Not on file   Highest education level: High school graduate  Occupational History   Not on file  Tobacco Use   Smoking status: Former Smoker    Packs/day: 1.50    Years: 15.00    Pack years: 22.50    Types: Cigarettes    Quit  date: 2012    Years since quitting: 9.8   Smokeless tobacco: Never Used  Scientific laboratory technician Use: Never used  Substance and Sexual Activity   Alcohol use: No    Alcohol/week: 0.0 standard drinks   Drug use: No   Sexual activity: Not Currently  Other Topics Concern   Not on file  Social History Narrative   Not on file   Social Determinants of Health   Financial Resource Strain: Low Risk    Difficulty of Paying Living Expenses: Not hard at all  Food Insecurity: No Food Insecurity   Worried About Charity fundraiser in the Last Year: Never true   [place] in the Last Year: Never true  Transportation Needs: No Transportation Needs   Lack of Transportation (Medical): No   Lack of Transportation (Non-Medical): No  Physical Activity: Inactive   Days of Exercise per Week: 0 days   Minutes of Exercise per Session: 0 min  Stress: No Stress Concern Present   Feeling of Stress : Not at all  Social Connections: Socially Isolated   Frequency of Communication with Friends and Family: More than three times a week   Frequency of Social Gatherings with Friends and Family: Three times a week   Attends Religious Services: Never   Active Member of Clubs or Organizations: No   Attends Archivist Meetings: Never   Marital Status: Widowed    Tobacco Counseling Counseling given: Not Answered   Clinical Intake:  Pre-visit preparation completed: Yes  Pain : No/denies pain     Nutritional Risks: None Diabetes: No  How often do you need to have someone help you when you read instructions, pamphlets, or other written materials from your doctor or pharmacy?: 1 - Never    Interpreter Needed?: No  Information entered by :: Clemetine Marker LPN   Activities of Daily Living In your present state of health, do you have any difficulty performing the following activities: 07/26/2020  Hearing? N  Comment declines hearing aids  Vision? N  Difficulty  concentrating or making decisions? N  Walking or climbing stairs? N  Dressing or bathing? N  Doing errands, shopping? N  Preparing Food and eating ? N  Using the Toilet? N  In the past six months, have you accidently leaked urine? N  Do you have problems with loss of bowel control? N  Managing your Medications? N  Managing your Finances? N  Housekeeping or managing your Housekeeping? N  Some recent data might be hidden    Patient Care Team: Glean Hess, MD as PCP - General (Internal Medicine) [facility] (Dermatology) Erby Pian, MD as Referring Physician (Specialist)  Indicate any recent Medical Services you may have received from other than [facility] providers in the past year (date may be approximate).     Assessment:   This is a routine wellness examination for [place].  Hearing/Vision screen No exam data present  Dietary issues and exercise activities discussed: Current Exercise Habits: The patient does not participate in regular exercise at present, Exercise limited by: None identified  Goals     Increase physical activity     Recommend increasing physical activity to at least 3 days per week      Depression Screen PHQ 2/9 Scores 07/26/2020 01/19/2020 09/07/2019 07/19/2019 06/29/2019 01/18/2019 11/13/2018  PHQ - 2 Score 0 0 0 0 0 0 0  PHQ- 9 Score - 0 0 - - - -    Fall Risk Fall Risk  07/26/2020 01/19/2020 07/19/2019 01/18/2019 06/11/2017  Falls in the past year? 0 0 0 0 No  Number falls in past yr: 0 0 0 0 -  Injury with Fall? 0 0 0 0 -  Risk for fall due to : No Fall Risks No Fall Risks - - -  Follow up Falls prevention discussed Falls evaluation completed Falls prevention discussed Falls evaluation completed -    Any stairs in or around the home? Yes  If so, are there any without handrails? No  Home free of loose throw rugs in walkways, pet beds, electrical cords, etc? Yes  Adequate lighting in your home to reduce risk of falls? Yes   ASSISTIVE  DEVICES UTILIZED TO PREVENT FALLS:  Life alert? No  Use of a cane, walker or w/c? No  Grab bars in the bathroom? No  Shower chair or bench in shower? No  Elevated toilet seat or a handicapped toilet? No   TIMED UP AND GO:  Was the test performed? No . Telephonic visit  Cognitive Function:     6CIT Screen 07/26/2020 07/19/2019  What Year? 0 points 0 points  What month? 0 points 0 points  What time? 0 points 0 points  Count back from 20 0 points 0 points  Months in reverse 0 points 0 points  Repeat phrase 0 points 0 points  Total Score 0 0    Immunizations Immunization History  Administered Date(s) Administered   Influenza Inj Mdck Quad Pf 06/23/2018   Influenza, High Dose Seasonal PF 07/17/2019, 07/16/2020   Influenza,inj,Quad PF,6+ Mos 07/07/2015, 07/10/2017   Influenza-Unspecified 06/30/2016, 07/17/2019   PFIZER SARS-COV-2 Vaccination 04/14/2020, 05/15/2020   Pneumococcal Conjugate-13 01/18/2019   Pneumococcal Polysaccharide-23 01/07/2017   Tdap 01/05/2016   Zoster 12/22/2013    TDAP status: Up to date   Flu Vaccine status: Up to date   Pneumococcal vaccine status: Up to date   Covid-19 vaccine status: Completed vaccines  Qualifies for Shingles Vaccine? Yes   Zostavax completed Yes   Shingrix Completed?: No.    Education has been provided regarding the importance of this vaccine. Patient has been advised to call insurance company to determine out of pocket expense if they have not yet received this vaccine. Advised may also receive vaccine at local pharmacy or Health Dept. Verbalized acceptance and understanding.  Screening Tests Health Maintenance  Topic Date Due   DEXA SCAN  Never done   MAMMOGRAM  06/27/2021   PNA vac Low Risk Adult (2 of 2 - PPSV23) 01/07/2022   COLONOSCOPY  03/02/2025   TETANUS/TDAP  01/04/2026   INFLUENZA VACCINE  Completed   COVID-19 Vaccine  Completed   Hepatitis C Screening  Completed    Health  Maintenance  Health Maintenance Due  Topic Date Due   DEXA SCAN  Never done    Colorectal cancer screening: Completed 03/03/15. Repeat every 10 years   Mammogram status: Completed 9/28/2. Repeat every year  Bone Density status: Ordered 01/19/20. Pt provided with contact info and advised to call to schedule appt.  Lung Cancer Screening: (Low Dose CT Chest recommended if Age 26-80 years, 30 pack-year currently smoking OR have quit w/in 15years.) does not qualify.   Additional Screening:  Hepatitis C Screening: does qualify; Completed 01/05/16  Vision Screening: Recommended annual ophthalmology exams for early detection of glaucoma and other disorders of the eye. Is the patient up to date with their annual eye exam?  Yes  Who is the provider or what is the name of the office in which the patient attends annual eye exams? [facility] Dr. Kerin Ransom  Dental Screening: Recommended annual dental exams for proper oral hygiene  Community Resource Referral / Chronic Care Management: CRR required this visit?  No   CCM required this visit?  No      Plan:     I have personally reviewed and noted the following in the patients chart:    Medical and social history  Use of alcohol, tobacco or illicit drugs   Current medications and supplements  Functional ability and status  Nutritional status  Physical activity  Advanced directives  List of other physicians  Hospitalizations, surgeries, and ER visits in previous 12 months  Vitals  Screenings to include cognitive, depression, and falls  Referrals and appointments  In addition, I have reviewed and discussed with patient certain preventive protocols, quality metrics, and best practice recommendations. A written personalized care plan for preventive services as well as general preventive health recommendations were provided to patient.     Clemetine Marker, LPN   [id number]   Nurse Notes: dexa ordered updated with  postmenopausal estrogen deficiency diagnosis due to patient stated that ins would not cover with previous order.

## 2020-08-17 ENCOUNTER — Observation Stay
Admission: EM | Admit: 2020-08-17 | Discharge: 2020-08-18 | Disposition: A | Discharge status: Home / Self Care | Payer: PPO | Attending: Internal Medicine | Admitting: Internal Medicine

## 2020-08-17 ENCOUNTER — Other Ambulatory Visit: Payer: Self-pay

## 2020-08-17 ENCOUNTER — Emergency Department: Payer: PPO

## 2020-08-17 ENCOUNTER — Encounter: Payer: Self-pay | Admitting: Emergency Medicine

## 2020-08-17 DIAGNOSIS — R509 Fever, unspecified: Secondary | ICD-10-CM | POA: Insufficient documentation

## 2020-08-17 DIAGNOSIS — J441 Chronic obstructive pulmonary disease with (acute) exacerbation: Secondary | ICD-10-CM | POA: Diagnosis present

## 2020-08-17 DIAGNOSIS — J449 Chronic obstructive pulmonary disease, unspecified: Secondary | ICD-10-CM | POA: Insufficient documentation

## 2020-08-17 DIAGNOSIS — Z7951 Long term (current) use of inhaled steroids: Secondary | ICD-10-CM | POA: Insufficient documentation

## 2020-08-17 DIAGNOSIS — J9601 Acute respiratory failure with hypoxia: Secondary | ICD-10-CM

## 2020-08-17 DIAGNOSIS — Z20822 Contact with and (suspected) exposure to covid-19: Secondary | ICD-10-CM | POA: Insufficient documentation

## 2020-08-17 DIAGNOSIS — E785 Hyperlipidemia, unspecified: Secondary | ICD-10-CM | POA: Diagnosis not present

## 2020-08-17 DIAGNOSIS — Z87891 Personal history of nicotine dependence: Secondary | ICD-10-CM | POA: Insufficient documentation

## 2020-08-17 DIAGNOSIS — R000 Tachycardia, unspecified: Secondary | ICD-10-CM | POA: Diagnosis not present

## 2020-08-17 DIAGNOSIS — R0602 Shortness of breath: Secondary | ICD-10-CM | POA: Insufficient documentation

## 2020-08-17 DIAGNOSIS — R059 Cough, unspecified: Secondary | ICD-10-CM | POA: Diagnosis not present

## 2020-08-17 HISTORY — DX: Acute respiratory failure with hypoxia: J96.01

## 2020-08-17 LAB — TROPONIN I (HIGH SENSITIVITY): Troponin I (High Sensitivity): 7 ng/L (ref <18)

## 2020-08-17 LAB — CBC WITH DIFFERENTIAL/PLATELET
Abs Immature Granulocytes: 0.03 10*3/uL (ref 0.00–0.07)
Basophils Absolute: 0 10*3/uL (ref 0.0–0.1)
Basophils Relative: 1 %
Eosinophils Absolute: 0.1 10*3/uL (ref 0.0–0.5)
Eosinophils Relative: 1 %
HCT: 39.1 % (ref 36.0–46.0)
Hemoglobin: 13.1 g/dL (ref 12.0–15.0)
Immature Granulocytes: 0 %
Lymphocytes Relative: 10 %
Lymphs Abs: 0.8 10*3/uL (ref 0.7–4.0)
MCH: 30.8 pg (ref 26.0–34.0)
MCHC: 33.5 g/dL (ref 30.0–36.0)
MCV: 91.8 fL (ref 80.0–100.0)
Monocytes Absolute: 0.7 10*3/uL (ref 0.1–1.0)
Monocytes Relative: 8 %
Neutro Abs: 7.1 10*3/uL (ref 1.7–7.7)
Neutrophils Relative %: 80 %
Platelets: 267 10*3/uL (ref 150–400)
RBC: 4.26 MIL/uL (ref 3.87–5.11)
RDW: 13.8 % (ref 11.5–15.5)
WBC: 8.8 10*3/uL (ref 4.0–10.5)
nRBC: 0 % (ref 0.0–0.2)

## 2020-08-17 LAB — COMPREHENSIVE METABOLIC PANEL
ALT: 24 U/L (ref 0–44)
AST: 25 U/L (ref 15–41)
Albumin: 4.3 g/dL (ref 3.5–5.0)
Alkaline Phosphatase: 74 U/L (ref 38–126)
Anion gap: 15 (ref 5–15)
BUN: 13 mg/dL (ref 8–23)
CO2: 23 mmol/L (ref 22–32)
Calcium: 9.3 mg/dL (ref 8.9–10.3)
Chloride: 103 mmol/L (ref 98–111)
Creatinine, Ser: 0.95 mg/dL (ref 0.44–1.00)
GFR, Estimated: >60 mL/min (ref >60)
Glucose, Bld: 118 mg/dL — ABNORMAL HIGH (ref 70–99)
Potassium: 3.8 mmol/L (ref 3.5–5.1)
Sodium: 141 mmol/L (ref 135–145)
Total Bilirubin: 0.7 mg/dL (ref 0.3–1.2)
Total Protein: 7.3 g/dL (ref 6.5–8.1)

## 2020-08-17 LAB — RESPIRATORY PANEL BY RT PCR (FLU A&B, COVID)
Influenza A by PCR: NEGATIVE
Influenza B by PCR: NEGATIVE
SARS Coronavirus 2 by RT PCR: NEGATIVE

## 2020-08-17 LAB — BRAIN NATRIURETIC PEPTIDE: B Natriuretic Peptide: 57.1 pg/mL (ref 0.0–100.0)

## 2020-08-17 MED ORDER — IPRATROPIUM-ALBUTEROL 0.5-2.5 (3) MG/3ML IN SOLN
3.0000 mL | RESPIRATORY_TRACT | Status: DC
  Administered 2020-08-17 – 2020-08-18 (×5): 3 mL via RESPIRATORY_TRACT
  Filled 2020-08-17 (×5): qty 3

## 2020-08-17 MED ORDER — ACETAMINOPHEN 325 MG PO TABS: 650.0000 mg | ORAL_TABLET | Freq: Four times a day (QID) | ORAL | Status: DC | PRN

## 2020-08-17 MED ORDER — ONDANSETRON HCL 4 MG/2ML IJ SOLN: 4.0000 mg | Freq: Three times a day (TID) | INTRAMUSCULAR | Status: DC | PRN

## 2020-08-17 MED ORDER — AZITHROMYCIN 500 MG PO TABS
250.0000 mg | ORAL_TABLET | Freq: Every day | ORAL | Status: DC
  Administered 2020-08-18: 250 mg via ORAL
  Filled 2020-08-17: qty 1

## 2020-08-17 MED ORDER — ASCORBIC ACID 500 MG PO TABS
1000.0000 mg | ORAL_TABLET | ORAL | Status: DC
  Administered 2020-08-17: 1000 mg via ORAL
  Filled 2020-08-17 (×2): qty 2

## 2020-08-17 MED ORDER — METHYLPREDNISOLONE SODIUM SUCC 125 MG IJ SOLR
125.0000 mg | Freq: Once | INTRAMUSCULAR | Status: AC
  Administered 2020-08-17: 125 mg via INTRAVENOUS
  Filled 2020-08-17: qty 2

## 2020-08-17 MED ORDER — AZITHROMYCIN 500 MG PO TABS
500.0000 mg | ORAL_TABLET | Freq: Every day | ORAL | Status: AC
  Administered 2020-08-17: 500 mg via ORAL
  Filled 2020-08-17: qty 1

## 2020-08-17 MED ORDER — DM-GUAIFENESIN ER 30-600 MG PO TB12: 1.0000 | ORAL_TABLET | Freq: Two times a day (BID) | ORAL | Status: DC | PRN

## 2020-08-17 MED ORDER — IPRATROPIUM-ALBUTEROL 0.5-2.5 (3) MG/3ML IN SOLN
3.0000 mL | Freq: Once | RESPIRATORY_TRACT | Status: AC
  Administered 2020-08-17: 3 mL via RESPIRATORY_TRACT
  Filled 2020-08-17: qty 3

## 2020-08-17 MED ORDER — ENOXAPARIN SODIUM 40 MG/0.4ML ~~LOC~~ SOLN
40.0000 mg | SUBCUTANEOUS | Status: DC
  Administered 2020-08-17: 21:00:00 40 mg via SUBCUTANEOUS
  Filled 2020-08-17: qty 0.4

## 2020-08-17 MED ORDER — ALBUTEROL SULFATE (2.5 MG/3ML) 0.083% IN NEBU: 2.5000 mg | INHALATION_SOLUTION | RESPIRATORY_TRACT | Status: DC | PRN

## 2020-08-17 MED ORDER — ORAL CARE MOUTH RINSE
15.0000 mL | Freq: Two times a day (BID) | OROMUCOSAL | Status: DC
  Administered 2020-08-17 – 2020-08-18 (×3): 15 mL via OROMUCOSAL

## 2020-08-17 MED ORDER — OMEGA-3-ACID ETHYL ESTERS 1 G PO CAPS
1.0000 g | ORAL_CAPSULE | Freq: Every day | ORAL | Status: DC
  Administered 2020-08-17 – 2020-08-18 (×2): 1 g via ORAL
  Filled 2020-08-17 (×3): qty 1

## 2020-08-17 MED ORDER — ATORVASTATIN CALCIUM 20 MG PO TABS
10.0000 mg | ORAL_TABLET | Freq: Every day | ORAL | Status: DC
  Administered 2020-08-17 – 2020-08-18 (×2): 10 mg via ORAL
  Filled 2020-08-17 (×2): qty 1

## 2020-08-17 MED ORDER — METHYLPREDNISOLONE SODIUM SUCC 40 MG IJ SOLR
40.0000 mg | Freq: Two times a day (BID) | INTRAMUSCULAR | Status: DC
  Administered 2020-08-17 – 2020-08-18 (×2): 40 mg via INTRAVENOUS
  Filled 2020-08-17 (×2): qty 1

## 2020-08-17 NOTE — ED Notes (Signed)
Dr Niu at bedside 

## 2020-08-17 NOTE — ED Notes (Signed)
Pt is resting quietly at this time. No distress noted. Warm blanket provided. Pt denies any other needs at this time. Call bell in reach.

## 2020-08-17 NOTE — ED Triage Notes (Signed)
Pt to triage via w/c with frequent congested cough noted; st since Tuesday having nonprod "deep" cough, runny nose; denies fever, denies pain; st hx COPD

## 2020-08-17 NOTE — ED Notes (Signed)
Report to Jackelyn Poling, Therapist, sports. All questions answered.

## 2020-08-17 NOTE — H&P (Signed)
History and Physical    Eileen Mills BJY:[id number] DOB: January 13, 1951 DOA: 08/17/2020  Referring MD/NP/PA:   PCP: Glean Hess, MD   Patient coming from:  The patient is coming from home.  At baseline, pt is independent for most of ADL.        Chief Complaint: SOB  HPI: Eileen Mills is a 69 y.o. female with medical history significant of COPD not using oxygen at home, hyperlipidemia, anxiety, former smoker, who presents with shortness breath.  Patient states that her shortness breath has been going on for 2 days, which has been progressively worsening. Patient has dry cough, no chest pain, fever or chills. Patient does not have nausea, vomiting, diarrhea, abdominal pain, symptoms of UTI or unilateral weakness.  Patient was found to have oxygen desaturation to 89% on room air initially, which improved to 92-99% on 2 L oxygen.  ED Course: pt was found to have WBC 8.8, troponin level 7, negative Covid PCR, BNP 57, electrolytes renal function okay, temperature normal, blood pressure 120/61, heart rate 128, 102, RR 33, chest x-ray negative. Patient is placed on MedSurg bed for observation.  Review of Systems:   General: no fevers, chills, no body weight gain,  has fatigue HEENT: no blurry vision, hearing changes or sore throat Respiratory: has dyspnea, coughing, wheezing CV: no chest pain, no palpitations GI: no nausea, vomiting, abdominal pain, diarrhea, constipation GU: no dysuria, burning on urination, increased urinary frequency, hematuria  Ext: no leg edema Neuro: no unilateral weakness, numbness, or tingling, no vision change or hearing loss Skin: no rash, no skin tear. MSK: No muscle spasm, no deformity, no limitation of range of movement in spin Heme: No easy bruising.  Travel history: No recent long distant travel.  Allergy:  Allergies  Allergen Reactions  . Sulfa Antibiotics Other (See Comments)    It was a long time ago, cannot remember reaction.     Past Medical History:  Diagnosis Date  . Arthritis   . Cataract   . COPD (chronic obstructive pulmonary disease) ([place])   . Emphysema of lung ([place])   . Hyperlipidemia     Past Surgical History:  Procedure Laterality Date  . ABDOMINAL HYSTERECTOMY    . CESAREAN SECTION    . COLONOSCOPY N/A 03/03/2015   Procedure: COLONOSCOPY;  Surgeon: Lucilla Lame, MD;  Location: [place];  Service: Gastroenterology;  Laterality: N/A;  . POLYPECTOMY  03/03/2015   Procedure: POLYPECTOMY INTESTINAL;  Surgeon: Lucilla Lame, MD;  Location: [place];  Service: Gastroenterology;;  . TUBAL LIGATION      Social History:  reports that she quit smoking about 9 years ago. Her smoking use included cigarettes. She has a 22.50 pack-year smoking history. She has never used smokeless tobacco. She reports that she does not drink alcohol and does not use drugs.  Family History:  Family History  Problem Relation Age of Onset  . Alzheimer's disease Mother   . CAD Father   . Breast cancer Neg Hx      Prior to Admission medications   Medication Sig Start Date End Date Taking? Authorizing Provider  albuterol (PROVENTIL HFA;VENTOLIN HFA) 108 (90 Base) MCG/ACT inhaler Inhale 2 puffs into the lungs every 6 (six) hours as needed for wheezing or shortness of breath. 01/17/16  Yes Triplett, Cari B, FNP  Ascorbic Acid (VITAMIN C) 1000 MG tablet Take 1,000 mg by mouth every other day.    Yes [provider]  atorvastatin (LIPITOR) 10 MG tablet  Take 1 tablet (10 mg total) by mouth daily. 04/07/20  Yes Juline Patch, MD  Cholecalciferol (D3-50) 50000 units capsule Take 5,000 Units by mouth daily.   Yes [provider]  Omega-3 Fatty Acids (FISH OIL) 1000 MG CAPS Take 1,000 mg by mouth daily.    Yes [provider]  Tiotropium Bromide Monohydrate (SPIRIVA RESPIMAT) 2.5 MCG/ACT AERS Inhale 2 puffs into the lungs every morning.   Yes [provider]  zinc gluconate 50 MG  tablet Take 50 mg by mouth daily.   Yes [provider]    Physical Exam: Vitals:   08/17/20 0930 08/17/20 0945 08/17/20 1015 08/17/20 1030  BP: 114/66 119/66 108/65 108/60  Pulse: 97 84 81 87  Resp: (!) 36 (!) 28 (!) 21 (!) 22  Temp:      SpO2: 92% 91% 90% 91%  Weight:      Height:       General: Not in acute distress HEENT:       Eyes: PERRL, EOMI, no scleral icterus.       ENT: No discharge from the ears and nose, no pharynx injection, no tonsillar enlargement.        Neck: No JVD, no bruit, no mass felt. Heme: No neck lymph node enlargement. Cardiac: S1/S2, RRR, No murmurs, No gallops or rubs. Respiratory: Has wheezing and rhonchi bilaterally. GI: Soft, nondistended, nontender, no rebound pain, no organomegaly, BS present. GU: No hematuria Ext: No pitting leg edema bilaterally. 2+DP/PT pulse bilaterally. Musculoskeletal: No joint deformities, No joint redness or warmth, no limitation of ROM in spin. Skin: No rashes.  Neuro: Alert, oriented X3, cranial nerves II-XII grossly intact, moves all extremities normally. Psych: Patient is not psychotic, no suicidal or hemocidal ideation.  Labs on Admission: I have personally reviewed following labs and imaging studies  CBC: Recent Labs  Lab 08/17/20 0717  WBC 8.8  NEUTROABS 7.1  HGB 13.1  HCT 39.1  MCV 91.8  PLT 409   Basic Metabolic Panel: Recent Labs  Lab 08/17/20 0717  NA 141  K 3.8  CL 103  CO2 23  GLUCOSE 118*  BUN 13  CREATININE 0.95  CALCIUM 9.3   GFR: Estimated Creatinine Clearance: 47.5 mL/min (by C-G formula based on SCr of 0.95 mg/dL). Liver Function Tests: Recent Labs  Lab 08/17/20 0717  AST 25  ALT 24  ALKPHOS 74  BILITOT 0.7  PROT 7.3  ALBUMIN 4.3   No results for input(s): LIPASE, AMYLASE in the last 168 hours. No results for input(s): AMMONIA in the last 168 hours. Coagulation Profile: No results for input(s): INR, PROTIME in the last 168 hours. Cardiac Enzymes: No results  for input(s): CKTOTAL, CKMB, CKMBINDEX, TROPONINI in the last 168 hours. BNP (last 3 results) No results for input(s): PROBNP in the last 8760 hours. HbA1C: No results for input(s): HGBA1C in the last 72 hours. CBG: No results for input(s): GLUCAP in the last 168 hours. Lipid Profile: No results for input(s): CHOL, HDL, LDLCALC, TRIG, CHOLHDL, LDLDIRECT in the last 72 hours. Thyroid Function Tests: No results for input(s): TSH, T4TOTAL, FREET4, T3FREE, THYROIDAB in the last 72 hours. Anemia Panel: No results for input(s): VITAMINB12, FOLATE, FERRITIN, TIBC, IRON, RETICCTPCT in the last 72 hours. Urine analysis:    Component Value Date/Time   COLORURINE YELLOW (A) 10/28/2018 1048   APPEARANCEUR CLEAR (A) 10/28/2018 [place] 09/24/2012 0759   LABSPEC 1.005 10/28/2018 1048   LABSPEC 1.011 09/24/2012 0759  PHURINE 6.0 10/28/2018 1048   GLUCOSEU NEGATIVE 10/28/2018 1048   GLUCOSEU Negative 09/24/2012 0759   HGBUR NEGATIVE 10/28/2018 1048   BILIRUBINUR neg 01/19/2020 0909   BILIRUBINUR Negative 09/24/2012 [place] 10/28/2018 1048   PROTEINUR Negative 01/19/2020 0909   PROTEINUR NEGATIVE 10/28/2018 1048   UROBILINOGEN 0.2 01/19/2020 0909   NITRITE neg 01/19/2020 0909   NITRITE NEGATIVE 10/28/2018 1048   LEUKOCYTESUR Negative 01/19/2020 0909   LEUKOCYTESUR Negative 09/24/2012 0759   Sepsis Labs: @LABRCNTIP (procalcitonin:4,lacticidven:4) ) Recent Results (from the past 240 hour(s))  Respiratory Panel by RT PCR (Flu A&B, Covid) - Nasopharyngeal Swab     Status: None   Collection Time: 08/17/20  7:17 AM   Specimen: Nasopharyngeal Swab  Result Value Ref Range Status   SARS Coronavirus 2 by RT PCR NEGATIVE NEGATIVE Final    Comment: (NOTE) SARS-CoV-2 target nucleic acids are NOT DETECTED.  The SARS-CoV-2 RNA is generally detectable in upper respiratoy specimens during the acute phase of infection. The lowest concentration of SARS-CoV-2 viral  copies this assay can detect is 131 copies/mL. A negative result does not preclude SARS-Cov-2 infection and should not be used as the sole basis for treatment or other patient management decisions. A negative result may occur with  improper specimen collection/handling, submission of specimen other than nasopharyngeal swab, presence of viral mutation(s) within the areas targeted by this assay, and inadequate number of viral copies (<131 copies/mL). A negative result must be combined with clinical observations, patient history, and epidemiological information. The expected result is Negative.  Fact Sheet for Patients:  PinkCheek.be  Fact Sheet for Healthcare Providers:  GravelBags.it  This test is no t yet approved or cleared by the [place] FDA and  has been authorized for detection and/or diagnosis of SARS-CoV-2 by FDA under an Emergency Use Authorization (EUA). This EUA will remain  in effect (meaning this test can be used) for the duration of the COVID-19 declaration under Section 564(b)(1) of the Act, 21 U.S.C. section 360bbb-3(b)(1), unless the authorization is terminated or revoked sooner.     Influenza A by PCR NEGATIVE NEGATIVE Final   Influenza B by PCR NEGATIVE NEGATIVE Final    Comment: (NOTE) The Xpert Xpress SARS-CoV-2/FLU/RSV assay is intended as an aid in  the diagnosis of influenza from Nasopharyngeal swab specimens and  should not be used as a sole basis for treatment. Nasal washings and  aspirates are unacceptable for Xpert Xpress SARS-CoV-2/FLU/RSV  testing.  Fact Sheet for Patients: PinkCheek.be  Fact Sheet for Healthcare Providers: GravelBags.it  This test is not yet approved or cleared by the [place] FDA and  has been authorized for detection and/or diagnosis of SARS-CoV-2 by  FDA under an Emergency Use Authorization (EUA). This  EUA will remain  in effect (meaning this test can be used) for the duration of the  Covid-19 declaration under Section 564(b)(1) of the Act, 21  U.S.C. section 360bbb-3(b)(1), unless the authorization is  terminated or revoked. Performed at [facility], [address], [place], [place] [address]      Radiological Exams on Admission: DG Chest [facility] 1 View  Result Date: 08/17/2020 CLINICAL DATA:  Shortness of breath. EXAM: PORTABLE CHEST 1 VIEW COMPARISON:  November 08, 2018 FINDINGS: The heart size and mediastinal contours are within normal limits. Both lungs are clear. No visible pleural effusions or pneumothorax. The visualized skeletal structures are unremarkable. IMPRESSION: No active disease. Electronically Signed   By: Margaretha Sheffield MD   On:  08/17/2020 07:40     EKG: I have personally reviewed. Sinus rhythm, QTC 460, LAE, low voltage.  Assessment/Plan Principal Problem:   COPD exacerbation (HCC) Active Problems:   Hyperlipidemia, mild   Acute respiratory failure with hypoxia (HCC)   Acute respiratory failure with hypoxia due to COPD exacerbation [facility]): Patient has cough, shortness of breath, wheezing or rhonchi on auscultation, negative chest x-ray for infiltration, clinically consistent with COPD exacerbation.  Patient has new oxygen requirement today.  - will place to med-surg bed for observation -Bronchodilators -Solu-Medrol 40 mg IV bid -Z pak  -Mucinex for cough  -Incentive spirometry -Follow up sputum culture -Nasal cannula oxygen as needed to maintain O2 saturation 93% or greater  Hyperlipidemia, mild -Lipitor   DVT ppx: SQ Lovenox Code Status: Full code Family Communication: not done, no family member is at bed side.   Disposition Plan:  Anticipate discharge back to previous environment Consults called: none Admission status: Med-surg bed for obs   Status is: Observation  The patient remains OBS appropriate and will d/c before 2  midnights.  Dispo: The patient is from: Home              Anticipated d/c is to: Home              Anticipated d/c date is: 1 day              Patient currently is not medically stable to d/c.          Date of Service 08/17/2020    [place] [facility]   If 7PM-7AM, please contact night-coverage [email or web address] 08/17/2020, 11:02 AM

## 2020-08-17 NOTE — ED Notes (Addendum)
Patient wheeled to patient room; patient moved from chair to bed. Patient placed on monitor, patient's oxygen saturation 84% on RA. Patient placed on 2L of oxygen via nasal cannula. Patient's oxygen level increased to 95% on RA. Patient does not wear oxygen at baseline.

## 2020-08-17 NOTE — ED Provider Notes (Signed)
[facility] Emergency Department Provider Note   ____________________________________________   First MD Initiated Contact with Patient 08/17/20 [phone]     (approximate)  I have reviewed the triage vital signs and the nursing notes.   HISTORY  Chief Complaint Cough    HPI Eileen Mills is a 69 y.o. female with a stated past medical history of COPD who presents for productive cough, shortness of breath, and subjective fever/chills over the last 3 days.  Patient states that she has been coughing up white mucus over this time.  Patient denies wearing any oxygen chronically.  Patient denies any recent sick contacts or recent travel.  Patient endorses medication adherence.         Past Medical History:  Diagnosis Date  . Arthritis   . Cataract   . COPD (chronic obstructive pulmonary disease) ([place])   . Emphysema of lung ([place])   . Hyperlipidemia     Patient Active Problem List   Diagnosis Date Noted  . Aortic atherosclerosis ([place]) 03/30/2020  . Hypercalcemia 07/10/2017  . Stage 2 moderate COPD by GOLD classification ([place]) 01/07/2017  . Ganglion cyst of wrist 07/02/2015  . Intermittent palpitations 07/02/2015  . Degenerative arthritis of spine with cord compression 07/02/2015  . Abdominal bloating 07/02/2015  . Baker's cyst of knee 07/02/2015  . Hyperlipidemia, mild 07/02/2015    Past Surgical History:  Procedure Laterality Date  . ABDOMINAL HYSTERECTOMY    . CESAREAN SECTION    . COLONOSCOPY N/A 03/03/2015   Procedure: COLONOSCOPY;  Surgeon: Lucilla Lame, MD;  Location: [place];  Service: Gastroenterology;  Laterality: N/A;  . POLYPECTOMY  03/03/2015   Procedure: POLYPECTOMY INTESTINAL;  Surgeon: Lucilla Lame, MD;  Location: [place];  Service: Gastroenterology;;  . TUBAL LIGATION      Prior to Admission medications   Medication Sig Start Date End Date Taking? Authorizing Provider  albuterol (PROVENTIL  HFA;VENTOLIN HFA) 108 (90 Base) MCG/ACT inhaler Inhale 2 puffs into the lungs every 6 (six) hours as needed for wheezing or shortness of breath. 01/17/16   Triplett, Cari B, FNP  Ascorbic Acid (VITAMIN C) 1000 MG tablet Take 1,000 mg by mouth every other day.     [provider]  atorvastatin (LIPITOR) 10 MG tablet Take 1 tablet (10 mg total) by mouth daily. 04/07/20   Juline Patch, MD  Cholecalciferol (D3-50) 50000 units capsule Take 5,000 Units by mouth daily.    [provider]  Multiple Vitamins-Minerals (CENTRUM SILVER 50+WOMEN PO) Take 1 tablet by mouth daily.     [provider]  Omega-3 Fatty Acids (FISH OIL) 1000 MG CAPS Take 1,000 mg by mouth daily.     [provider]  Tiotropium Bromide Monohydrate (SPIRIVA RESPIMAT) 2.5 MCG/ACT AERS Inhale 2 puffs into the lungs every morning.    [provider]  zinc gluconate 50 MG tablet Take 50 mg by mouth daily.    [provider]    Allergies Sulfa antibiotics  Family History  Problem Relation Age of Onset  . Alzheimer's disease Mother   . CAD Father   . Breast cancer Neg Hx     Social History Social History   Tobacco Use  . Smoking status: Former Smoker    Packs/day: 1.50    Years: 15.00    Pack years: 22.50    Types: Cigarettes    Quit date: 2012    Years since quitting: 9.8  . Smokeless tobacco: Never Used  Vaping  Use  . Vaping Use: Never used  Substance Use Topics  . Alcohol use: No    Alcohol/week: 0.0 standard drinks  . Drug use: No    Review of Systems Constitutional: No fever/chills Eyes: No visual changes. ENT: No sore throat. Cardiovascular: Denies chest pain. Respiratory: Endorses shortness of breath. Gastrointestinal: No abdominal pain.  No nausea, no vomiting.  No diarrhea. Genitourinary: Negative for dysuria. Musculoskeletal: Negative for acute arthralgias Skin: Negative for rash. Neurological: Negative for headaches, weakness/numbness/paresthesias  in any extremity Psychiatric: Negative for suicidal ideation/homicidal ideation   ____________________________________________   PHYSICAL EXAM:  VITAL SIGNS: ED Triage Vitals [08/17/20 0658]  Enc Vitals Group     BP (!) 116/57     Pulse Rate (!) 128     Resp (!) 28     Temp 98.6 F (37 C)     Temp src      SpO2 90 %     Weight 125 lb (56.7 kg)     Height 5\' 3"  (1.6 m)     Head Circumference      Peak Flow      Pain Score 0     Pain Loc      Pain Edu?      Excl. in [place]?    Constitutional: Alert and oriented. Well appearing and in no acute distress. Eyes: Conjunctivae are normal. PERRL. Head: Atraumatic. Nose: No congestion/rhinnorhea. Mouth/Throat: Mucous membranes are moist. Neck: No stridor Cardiovascular: Grossly normal heart sounds.  Good peripheral circulation. Respiratory: Normal respiratory effort.  No retractions.  End expiratory wheezes over bilateral lung fields Gastrointestinal: Soft and nontender. No distention. Musculoskeletal: No obvious deformities Neurologic:  Normal speech and language. No gross focal neurologic deficits are appreciated. Skin:  Skin is warm and dry. No rash noted. Psychiatric: Mood and affect are normal. Speech and behavior are normal.  ____________________________________________   LABS (all labs ordered are listed, but only abnormal results are displayed)  Labs Reviewed  RESPIRATORY PANEL BY RT PCR (FLU A&B, COVID)  CBC WITH DIFFERENTIAL/PLATELET  BRAIN NATRIURETIC PEPTIDE  COMPREHENSIVE METABOLIC PANEL  TROPONIN I (HIGH SENSITIVITY)   ____________________________________________  EKG  ED ECG REPORT I, Naaman Plummer, the attending physician, personally viewed and interpreted this ECG.  Date: 08/17/2020 EKG Time: 0701 Rate: 110 Rhythm: Tachycardic sinus rhythm QRS Axis: normal Intervals: normal ST/T Wave abnormalities: normal Narrative Interpretation: no evidence of acute  ischemia  ____________________________________________  RADIOLOGY  ED MD interpretation: Single view portable x-ray of the chest does not show any evidence of acute abnormalities including no evidence of pneumonia, pneumothorax, or widened mediastinum  Official radiology report(s): DG Chest Port 1 View  Result Date: 08/17/2020 CLINICAL DATA:  Shortness of breath. EXAM: PORTABLE CHEST 1 VIEW COMPARISON:  November 08, 2018 FINDINGS: The heart size and mediastinal contours are within normal limits. Both lungs are clear. No visible pleural effusions or pneumothorax. The visualized skeletal structures are unremarkable. IMPRESSION: No active disease. Electronically Signed   By: Margaretha Sheffield MD   On: 08/17/2020 07:40    ____________________________________________   PROCEDURES  Procedure(s) performed (including Critical Care):  .Critical Care Performed by: Naaman Plummer, MD Authorized by: Naaman Plummer, MD   Critical care provider statement:    Critical care time (minutes):  41   Critical care time was exclusive of:  Separately billable procedures and treating other patients   Critical care was necessary to treat or prevent imminent or life-threatening deterioration of the following conditions:  Respiratory failure  Critical care was time spent personally by me on the following activities:  Discussions with consultants, evaluation of patient's response to treatment, examination of patient, ordering and performing treatments and interventions, ordering and review of laboratory studies, ordering and review of radiographic studies, pulse oximetry, re-evaluation of patient's condition, obtaining history from patient or surrogate and review of old charts   I assumed direction of critical care for this patient from another provider in my specialty: no   .1-3 Lead EKG Interpretation Performed by: Naaman Plummer, MD Authorized by: Naaman Plummer, MD     Interpretation: abnormal      ECG rate:  109   ECG rate assessment: tachycardic     Rhythm: sinus tachycardia     Ectopy: none     Conduction: normal       ____________________________________________   INITIAL IMPRESSION / ASSESSMENT AND PLAN / ED COURSE  As part of my medical decision making, I reviewed the following data within the [facility] notes reviewed and incorporated, Labs reviewed, EKG interpreted, Old chart reviewed, Radiograph reviewed and Notes from prior ED visits reviewed and incorporated        The patient appears to be suffering from a moderate/severe exacerbation of COPD.  Based on the history, exam, CXR/EKG reviewed by me, and further workup I dont suspect any other emergent cause of this presentation, such as pneumonia, acute coronary syndrome, congestive heart failure, pulmonary embolism, or pneumothorax.  ED Interventions: bronchodilators, steroids, antibiotics, reassess  Reassessment: After treatment, the patients shortness of breath is improving but patient is still requiring supplemental oxygenation  Disposition: Admit      ____________________________________________   FINAL CLINICAL IMPRESSION(S) / ED DIAGNOSES  Final diagnoses:  None     ED Discharge Orders    None       Note:  This document was prepared using Dragon voice recognition software and may include unintentional dictation errors.   Naaman Plummer, MD 08/17/20 [phone]

## 2020-08-17 NOTE — ED Notes (Signed)
Pt presents to ED with SOB. Triage nurse states 84% on RA when this RN entered room pt is 95% on 2L/min via [place]. Pt states she started feeling "bad" on Tuesday pt states chills and SOB that has since became worse. Pt also states non productive cough, when asked if she is able to spit anything up, she states "its stuck" Pt appears to have a increased work of breathing including becoming dyspenic with minimal talking. Wheezing and Rhonchi noted throughout all anterior and posterior lobes. Pt states a HX of COPD. Pt denies wearing O2 chronically. Pt is A&Ox4. Pt denies any other complaints at this time.

## 2020-08-18 DIAGNOSIS — J441 Chronic obstructive pulmonary disease with (acute) exacerbation: Secondary | ICD-10-CM | POA: Diagnosis not present

## 2020-08-18 LAB — BASIC METABOLIC PANEL
Anion gap: 8 (ref 5–15)
BUN: 15 mg/dL (ref 8–23)
CO2: 25 mmol/L (ref 22–32)
Calcium: 9.1 mg/dL (ref 8.9–10.3)
Chloride: 107 mmol/L (ref 98–111)
Creatinine, Ser: 0.84 mg/dL (ref 0.44–1.00)
GFR, Estimated: >60 mL/min (ref >60)
Glucose, Bld: 150 mg/dL — ABNORMAL HIGH (ref 70–99)
Potassium: 3.8 mmol/L (ref 3.5–5.1)
Sodium: 140 mmol/L (ref 135–145)

## 2020-08-18 LAB — CBC
HCT: 36.8 % (ref 36.0–46.0)
Hemoglobin: 12.4 g/dL (ref 12.0–15.0)
MCH: 30.8 pg (ref 26.0–34.0)
MCHC: 33.7 g/dL (ref 30.0–36.0)
MCV: 91.3 fL (ref 80.0–100.0)
Platelets: 269 10*3/uL (ref 150–400)
RBC: 4.03 MIL/uL (ref 3.87–5.11)
RDW: 13.9 % (ref 11.5–15.5)
WBC: 8.6 10*3/uL (ref 4.0–10.5)
nRBC: 0 % (ref 0.0–0.2)

## 2020-08-18 LAB — HIV ANTIBODY (ROUTINE TESTING W REFLEX): HIV Screen 4th Generation wRfx: NONREACTIVE

## 2020-08-18 MED ORDER — PREDNISONE 10 MG PO TABS: ORAL_TABLET | ORAL | 0 refills | Status: DC

## 2020-08-18 MED ORDER — IPRATROPIUM-ALBUTEROL 0.5-2.5 (3) MG/3ML IN SOLN
3.0000 mL | Freq: Four times a day (QID) | RESPIRATORY_TRACT | Status: DC
  Administered 2020-08-18: 08:00:00 3 mL via RESPIRATORY_TRACT
  Filled 2020-08-18 (×2): qty 3

## 2020-08-18 MED ORDER — AZITHROMYCIN 250 MG PO TABS
250.0000 mg | ORAL_TABLET | Freq: Every day | ORAL | 0 refills | Status: DC
Start: 2020-08-19 — End: 2020-08-28

## 2020-08-18 MED ORDER — IPRATROPIUM-ALBUTEROL 0.5-2.5 (3) MG/3ML IN SOLN
3.0000 mL | Freq: Four times a day (QID) | RESPIRATORY_TRACT | 1 refills | Status: DC | PRN
Start: 2020-08-18 — End: 2021-03-21

## 2020-08-18 NOTE — TOC Transition Note (Signed)
Transition of Care [facility]) - CM/SW Discharge Note   Patient Details  Name: Eileen Mills MRN: [id number] Date of Birth: Oct 20, 1950  [facility] CM/SW Contact:  Shelbie Hutching, RN Phone Number: 08/18/2020, 11:07 AM   Clinical Narrative:     Patient medically cleared for discharge home.  Patient will need home O2.  Referral for home oxygen given to [facility] with [employer].  Oxygen will be delivered to the patient's room before she discharges.   Final next level of care: Home/Self Care Barriers to Discharge: Barriers Resolved   Patient Goals and CMS Choice        Discharge Placement                       Discharge Plan and Services                DME Arranged: Oxygen DME Agency: [employer] Date DME Agency Contacted: 08/18/20 Time DME Agency Contacted: 30 Representative spoke with at DME Agency: [place] Arranged: NA          Social Determinants of Health ([place]) Interventions     Readmission Risk Interventions No flowsheet data found.

## 2020-08-18 NOTE — Progress Notes (Signed)
Per Dr Posey Pronto d.c tele monitoring

## 2020-08-18 NOTE — Progress Notes (Signed)
SATURATION QUALIFICATIONS: (This note is used to comply with regulatory documentation for home oxygen)  Patient Saturations on Room Air at Rest = 97%  Patient Saturations on Room Air while Ambulating = 86%  Patient Saturations on 2 Liters of oxygen while Ambulating = 93%  Please briefly explain why patient needs home oxygen:  With ambulation

## 2020-08-18 NOTE — Discharge Summary (Signed)
Eileen Mills    MR#:  [id number]  DATE OF BIRTH:  October 25, 1950  DATE OF ADMISSION:  08/17/2020 ADMITTING PHYSICIAN: Ivor Costa, MD  DATE OF DISCHARGE: 08/18/2020  PRIMARY CARE PHYSICIAN: Glean Hess, MD    ADMISSION DIAGNOSIS:  COPD exacerbation ([place]) [J44.1] Acute respiratory failure with hypoxia ([place]) [J96.01]  DISCHARGE DIAGNOSIS:  Acute Respiratory failure with hypoxia--improved COPD flare with h/o Emphysema  SECONDARY DIAGNOSIS:   Past Medical History:  Diagnosis Date  . Arthritis   . Cataract   . COPD (chronic obstructive pulmonary disease) ([place])   . Emphysema of lung ([place])   . Hyperlipidemia     HOSPITAL COURSE:   Eileen Mills is a 69 y.o. female with medical history significant of COPD not using oxygen at home, hyperlipidemia, anxiety, former smoker, who presents with shortness breath.   Acute respiratory failure with hypoxia due to COPD exacerbation [facility]): Patient has cough, shortness of breath, wheezing or rhonchi on auscultation, negative chest x-ray for infiltration, clinically consistent with COPD exacerbation.  Patient has new oxygen requirement in the ER. sats dropped in 88-89% on RA when she came--recovered quickly with 2 LNC -pt DOES qualify for home oxygen per RN. Will set up 2L [place] to go home with -Bronchodilators -Solu-Medrol 40 mg IV bid--change to po prednisone taper -Z pak empirically -Mucinex for cough  -Incentive spirometry -Nasal cannula oxygen as neededto maintain O2 saturation 93% or greater -CXR negative for PNA  Hyperlipidemia, mild -Lipitor   DVT ppx: SQ Lovenox Code Status: Full code Family Communication: not done, pt says her dter is informed and I dont need to call her Disposition Plan:  Anticipate discharge back to previous environment today with oxygen  And Nebulizer Consults called: none Admission status: Med-surg bed for obs    Status is: Observation   Dispo: The patient is from: Home  Anticipated d/c is to: Home  Anticipated d/c date is: today with oxygen (new) and nebulizer  Patient currently is  medically stable to d/c and excited to go home CONSULTS OBTAINED:    DRUG ALLERGIES:   Allergies  Allergen Reactions  . Sulfa Antibiotics Other (See Comments)    It was a long time ago, cannot remember reaction.    DISCHARGE MEDICATIONS:   Allergies as of 08/18/2020      Reactions   Sulfa Antibiotics Other (See Comments)   It was a long time ago, cannot remember reaction.      Medication List    TAKE these medications   albuterol 108 (90 Base) MCG/ACT inhaler Commonly known as: VENTOLIN HFA Inhale 2 puffs into the lungs every 6 (six) hours as needed for wheezing or shortness of breath.   atorvastatin 10 MG tablet Commonly known as: LIPITOR Take 1 tablet (10 mg total) by mouth daily.   azithromycin 250 MG tablet Commonly known as: ZITHROMAX Take 1 tablet (250 mg total) by mouth daily. Start taking on: August 19, 2020   D3-50 1.25 MG (50000 UT) capsule Generic drug: Cholecalciferol Take 5,000 Units by mouth daily.   Fish Oil 1000 MG Caps Take 1,000 mg by mouth daily.   ipratropium-albuterol 0.5-2.5 (3) MG/3ML Soln Commonly known as: DUONEB Take 3 mLs by nebulization every 6 (six) hours as needed.   predniSONE 10 MG tablet Commonly known as: DELTASONE Take 50 mg daily--taper by 10 mg daily then stop   Spiriva Respimat 2.5 MCG/ACT Aers Generic drug: Tiotropium Bromide Monohydrate  Inhale 2 puffs into the lungs every morning.   vitamin C 1000 MG tablet Take 1,000 mg by mouth every other day.   zinc gluconate 50 MG tablet Take 50 mg by mouth daily.            Durable Medical Equipment  (From admission, onward)         Start     Ordered   08/18/20 0846  For home use only DME Nebulizer machine  Once       Question Answer Comment   Patient needs a nebulizer to treat with the following condition COPD (chronic obstructive pulmonary disease) with emphysema ([place])   Length of Need Lifetime      08/18/20 0845          If you experience worsening of your admission symptoms, develop shortness of breath, life threatening emergency, suicidal or homicidal thoughts you must seek medical attention immediately by calling 911 or calling your MD immediately  if symptoms less severe.  You Must read complete instructions/literature along with all the possible adverse reactions/side effects for all the Medicines you take and that have been prescribed to you. Take any new Medicines after you have completely understood and accept all the possible adverse reactions/side effects.   Please note  You were cared for by a hospitalist during your hospital stay. If you have any questions about your discharge medications or the care you received while you were in the hospital after you are discharged, you can call the unit and asked to speak with the hospitalist on call if the hospitalist that took care of you is not available. Once you are discharged, your primary care physician will handle any further medical issues. Please note that NO REFILLS for any discharge medications will be authorized once you are discharged, as it is imperative that you return to your primary care physician (or establish a relationship with a primary care physician if you do not have one) for your aftercare needs so that they can reassess your need for medications and monitor your lab values. Today   SUBJECTIVE  I feel better Dry cough No fever no wheezing   VITAL SIGNS:  Blood pressure 121/62, pulse 89, temperature 98.3 F (36.8 C), temperature source Oral, resp. rate 19, height 5\' 3"  (1.6 m), weight 56.5 kg, SpO2 97 %.  I/O:  No intake or output data in the 24 hours ending 08/18/20 0846  PHYSICAL EXAMINATION:  GENERAL:  69 y.o.-year-old patient lying in the  bed with no acute distress.   HEENT: Head atraumatic, normocephalic. Oropharynx and nasopharynx clear.   LUNGS: Normal coarse breath sounds bilaterally, no wheezing, rales,rhonchi or crepitation. No use of accessory muscles of respiration.  CARDIOVASCULAR: S1, S2 normal. No murmurs, rubs, or gallops.  ABDOMEN: Soft, non-tender, non-distended. Bowel sounds present. No organomegaly or mass.  EXTREMITIES: No pedal edema, cyanosis, or clubbing.  NEUROLOGIC: Cranial nerves II through XII are intact. Muscle strength 5/5 in all extremities. Sensation intact. Gait not checked.  PSYCHIATRIC: The patient is alert and oriented x 3.  SKIN: No obvious rash, lesion, or ulcer.   DATA REVIEW:   CBC  Recent Labs  Lab 08/18/20 0404  WBC 8.6  HGB 12.4  HCT 36.8  PLT 269    Chemistries  Recent Labs  Lab 08/17/20 0717 08/17/20 0717 08/18/20 0404  NA 141   < > 140  K 3.8   < > 3.8  CL 103   < > 107  CO2 23   < >  25  GLUCOSE 118*   < > 150*  BUN 13   < > 15  CREATININE 0.95   < > 0.84  CALCIUM 9.3   < > 9.1  AST 25  --   --   ALT 24  --   --   ALKPHOS 74  --   --   BILITOT 0.7  --   --    < > = values in this interval not displayed.    Microbiology Results   Recent Results (from the past 240 hour(s))  Respiratory Panel by RT PCR (Flu A&B, Covid) - Nasopharyngeal Swab     Status: None   Collection Time: 08/17/20  7:17 AM   Specimen: Nasopharyngeal Swab  Result Value Ref Range Status   SARS Coronavirus 2 by RT PCR NEGATIVE NEGATIVE Final    Comment: (NOTE) SARS-CoV-2 target nucleic acids are NOT DETECTED.  The SARS-CoV-2 RNA is generally detectable in upper respiratoy specimens during the acute phase of infection. The lowest concentration of SARS-CoV-2 viral copies this assay can detect is 131 copies/mL. A negative result does not preclude SARS-Cov-2 infection and should not be used as the sole basis for treatment or other patient management decisions. A negative result may occur  with  improper specimen collection/handling, submission of specimen other than nasopharyngeal swab, presence of viral mutation(s) within the areas targeted by this assay, and inadequate number of viral copies (<131 copies/mL). A negative result must be combined with clinical observations, patient history, and epidemiological information. The expected result is Negative.  Fact Sheet for Patients:  PinkCheek.be  Fact Sheet for Healthcare Providers:  GravelBags.it  This test is no t yet approved or cleared by the [place] FDA and  has been authorized for detection and/or diagnosis of SARS-CoV-2 by FDA under an Emergency Use Authorization (EUA). This EUA will remain  in effect (meaning this test can be used) for the duration of the COVID-19 declaration under Section 564(b)(1) of the Act, 21 U.S.C. section 360bbb-3(b)(1), unless the authorization is terminated or revoked sooner.     Influenza A by PCR NEGATIVE NEGATIVE Final   Influenza B by PCR NEGATIVE NEGATIVE Final    Comment: (NOTE) The Xpert Xpress SARS-CoV-2/FLU/RSV assay is intended as an aid in  the diagnosis of influenza from Nasopharyngeal swab specimens and  should not be used as a sole basis for treatment. Nasal washings and  aspirates are unacceptable for Xpert Xpress SARS-CoV-2/FLU/RSV  testing.  Fact Sheet for Patients: PinkCheek.be  Fact Sheet for Healthcare Providers: GravelBags.it  This test is not yet approved or cleared by the [place] FDA and  has been authorized for detection and/or diagnosis of SARS-CoV-2 by  FDA under an Emergency Use Authorization (EUA). This EUA will remain  in effect (meaning this test can be used) for the duration of the  Covid-19 declaration under Section 564(b)(1) of the Act, 21  U.S.C. section 360bbb-3(b)(1), unless the authorization is  terminated or  revoked. Performed at [facility], [address] [place], [place] [address]     RADIOLOGY:  [facility] Chest Port 1 View  Result Date: 08/17/2020 CLINICAL DATA:  Shortness of breath. EXAM: PORTABLE CHEST 1 VIEW COMPARISON:  November 08, 2018 FINDINGS: The heart size and mediastinal contours are within normal limits. Both lungs are clear. No visible pleural effusions or pneumothorax. The visualized skeletal structures are unremarkable. IMPRESSION: No active disease. Electronically Signed   By: Margaretha Sheffield MD   On: 08/17/2020 07:40  CODE STATUS:     Code Status Orders  (From admission, onward)         Start     Ordered   08/17/20 0949  Full code  Continuous        08/17/20 0948        Code Status History    This patient has a current code status but no historical code status.   Advance Care Planning Activity       TOTAL TIME TAKING CARE OF THIS PATIENT: 35* minutes.    Fritzi Mandes M.D  [facility]    CC: Primary care physician; Glean Hess, MD

## 2020-08-18 NOTE — Plan of Care (Signed)
  Problem: Education: Goal: Knowledge of General Education information will improve Description: Including pain rating scale, medication(s)/side effects and non-pharmacologic comfort measures Outcome: Progressing   Problem: Health Behavior/Discharge Planning: Goal: Ability to manage health-related needs will improve Outcome: Progressing   Problem: Clinical Measurements: Goal: Ability to maintain clinical measurements within normal limits will improve Outcome: Progressing Goal: Will remain free from infection Outcome: Progressing Goal: Diagnostic test results will improve Outcome: Progressing Goal: Cardiovascular complication will be avoided Outcome: Progressing   Problem: Elimination: Goal: Will not experience complications related to bowel motility Outcome: Progressing Goal: Will not experience complications related to urinary retention Outcome: Progressing   Problem: Pain Managment: Goal: General experience of comfort will improve Outcome: Progressing   Problem: Safety: Goal: Ability to remain free from injury will improve Outcome: Progressing   Problem: Skin Integrity: Goal: Risk for impaired skin integrity will decrease Outcome: Progressing

## 2020-08-18 NOTE — Discharge Instructions (Signed)
Use your nebulizer as instructed

## 2020-08-21 ENCOUNTER — Telehealth: Payer: Self-pay

## 2020-08-21 NOTE — Telephone Encounter (Signed)
Transition Care Management Follow-up Telephone Call  Date of discharge and from where: 08/18/20 [facility]  How have you been since you were released from the hospital? Pt states she is doing okay.   Any questions or concerns? No  Items Reviewed:  Did the pt receive and understand the discharge instructions provided? Yes   Medications obtained and verified? Yes   Other? No   Any new allergies since your discharge? No   Dietary orders reviewed? Yes  Do you have support at home? Yes   Home Care and Equipment/Supplies: Were home health services ordered? no Were any new equipment or medical supplies ordered?  Yes: oxygen and nebulizer What is the name of the medical supply agency? Pt does not know Were you able to get the supplies/equipment? Yes with oxygen but awaiting nebulizer; has contacted Dr. Gust Brooms office about this Do you have any questions related to the use of the equipment or supplies? No  Functional Questionnaire: (I = Independent and D = Dependent) ADLs: I  Bathing/Dressing- I  Meal Prep- I  Eating- I  Maintaining continence- I  Transferring/Ambulation- I  Managing Meds- I  Follow up appointments reviewed:   PCP Hospital f/u appt confirmed? Yes  Scheduled to see Dr. Army Melia on 08/28/20 @ 3:00.  [facility] f/u appt confirmed? Yes  Scheduled to see Dr. Raul Del on 09/01/20.  Are transportation arrangements needed? No   If their condition worsens, is the pt aware to call PCP or go to the Emergency Dept.? Yes  Was the patient provided with contact information for the PCP's office or ED? Yes  Was to pt encouraged to call back with questions or concerns? Yes

## 2020-08-25 DIAGNOSIS — J441 Chronic obstructive pulmonary disease with (acute) exacerbation: Secondary | ICD-10-CM | POA: Diagnosis not present

## 2020-08-28 ENCOUNTER — Other Ambulatory Visit: Payer: Self-pay

## 2020-08-28 ENCOUNTER — Encounter: Payer: Self-pay | Admitting: Internal Medicine

## 2020-08-28 ENCOUNTER — Ambulatory Visit (INDEPENDENT_AMBULATORY_CARE_PROVIDER_SITE_OTHER): Payer: PPO | Admitting: Internal Medicine

## 2020-08-28 VITALS — BP 126/72 | HR 77 | Temp 98.1°F | Ht 63.0 in | Wt 126.0 lb

## 2020-08-28 DIAGNOSIS — J441 Chronic obstructive pulmonary disease with (acute) exacerbation: Secondary | ICD-10-CM

## 2020-08-28 DIAGNOSIS — I700 Atherosclerosis of aorta: Secondary | ICD-10-CM | POA: Diagnosis not present

## 2020-08-28 MED ORDER — TRELEGY ELLIPTA 200-62.5-25 MCG/INH IN AEPB: 1.0000 | INHALATION_SPRAY | Freq: Every day | RESPIRATORY_TRACT | 5 refills | Status: DC

## 2020-08-28 NOTE — Progress Notes (Signed)
Date:  08/28/2020   Name:  Eileen Mills   DOB:  1951/07/19   MRN:  [id number]   Chief Complaint: Hospitalization Follow-up (COPD- pt feels ok but is still weak and has a cough (thick yellow mucous) that feels like its deep in her chest, ) Hospital follow up.  Admitted to [facility]  08/17/20 to 08/18/20 for COPD exacerbation.  She received a TOC call on 08/21/20.  Hospital course: Acute respiratory failure with hypoxiadue to COPD exacerbation (HCC):Patient has cough, shortness of breath, wheezing or rhonchi on auscultation, negative chest x-ray for infiltration, clinically consistent with COPD exacerbation. Patient has new oxygen requirement in the ER. sats dropped in 88-89% on RA when she came--recovered quickly with 2 LNC -pt DOES qualify for home oxygen per RN. Will set up 2L [place] to go home with -Bronchodilators -Solu-Medrol40 mg IVbid--change to po prednisone taper -Z pak empirically -Mucinex for cough  -Incentive spirometry -Nasal cannula oxygen as neededto maintain O2 saturation 93% or greater -CXR negative for PNA -Respiratory panel negative.  HPI  Lab Results  Component Value Date   CREATININE 0.84 08/18/2020   BUN 15 08/18/2020   NA 140 08/18/2020   K 3.8 08/18/2020   CL 107 08/18/2020   CO2 25 08/18/2020   Lab Results  Component Value Date   CHOL 261 (H) 01/19/2020   HDL 75 01/19/2020   LDLCALC 155 (H) 01/19/2020   TRIG 172 (H) 01/19/2020   CHOLHDL 3.5 01/19/2020   Lab Results  Component Value Date   TSH 1.570 01/19/2020   No results found for: HGBA1C Lab Results  Component Value Date   WBC 8.6 08/18/2020   HGB 12.4 08/18/2020   HCT 36.8 08/18/2020   MCV 91.3 08/18/2020   PLT 269 08/18/2020   Lab Results  Component Value Date   ALT 24 08/17/2020   AST 25 08/17/2020   ALKPHOS 74 08/17/2020   BILITOT 0.7 08/17/2020     Review of Systems  Constitutional: Positive for fatigue. Negative for chills, diaphoresis and fever.  HENT:  Negative for trouble swallowing.   Respiratory: Positive for cough, chest tightness, shortness of breath and wheezing.   Cardiovascular: Negative for chest pain and palpitations.  Gastrointestinal: Negative for abdominal pain.  Neurological: Negative for dizziness, light-headedness and headaches.  Psychiatric/Behavioral: Negative for dysphoric mood and sleep disturbance. The patient is not nervous/anxious.     Patient Active Problem List   Diagnosis Date Noted  . COPD exacerbation ([place]) 08/17/2020  . Aortic atherosclerosis ([place]) 03/30/2020  . Hypercalcemia 07/10/2017  . Stage 2 moderate COPD by GOLD classification ([place]) 01/07/2017  . Ganglion cyst of wrist 07/02/2015  . Intermittent palpitations 07/02/2015  . Degenerative arthritis of spine with cord compression 07/02/2015  . Abdominal bloating 07/02/2015  . Baker's cyst of knee 07/02/2015  . Hyperlipidemia, mild 07/02/2015    Allergies  Allergen Reactions  . Sulfa Antibiotics Other (See Comments)    It was a long time ago, cannot remember reaction.    Past Surgical History:  Procedure Laterality Date  . ABDOMINAL HYSTERECTOMY    . CESAREAN SECTION    . COLONOSCOPY N/A 03/03/2015   Procedure: COLONOSCOPY;  Surgeon: Lucilla Lame, MD;  Location: [facility];  Service: Gastroenterology;  Laterality: N/A;  . POLYPECTOMY  03/03/2015   Procedure: POLYPECTOMY INTESTINAL;  Surgeon: Lucilla Lame, MD;  Location: [facility];  Service: Gastroenterology;;  . TUBAL LIGATION      Social History   Tobacco Use  .  Smoking status: Former Smoker    Packs/day: 1.50    Years: 15.00    Pack years: 22.50    Types: Cigarettes    Quit date: 2012    Years since quitting: 9.9  . Smokeless tobacco: Never Used  Vaping Use  . Vaping Use: Never used  Substance Use Topics  . Alcohol use: No    Alcohol/week: 0.0 standard drinks  . Drug use: No     Medication list has been reviewed and updated.  Current Meds  Medication Sig  .  albuterol (PROVENTIL HFA;VENTOLIN HFA) 108 (90 Base) MCG/ACT inhaler Inhale 2 puffs into the lungs every 6 (six) hours as needed for wheezing or shortness of breath.  . Ascorbic Acid (VITAMIN C) 1000 MG tablet Take 1,000 mg by mouth every other day.   Marland Kitchen atorvastatin (LIPITOR) 10 MG tablet Take 1 tablet (10 mg total) by mouth daily.  Marland Kitchen azithromycin (ZITHROMAX) 250 MG tablet Take 1 tablet (250 mg total) by mouth daily.  . Cholecalciferol (D3-50) 50000 units capsule Take 5,000 Units by mouth daily.  Marland Kitchen ipratropium-albuterol (DUONEB) 0.5-2.5 (3) MG/3ML SOLN Take 3 mLs by nebulization every 6 (six) hours as needed.  . Omega-3 Fatty Acids (FISH OIL) 1000 MG CAPS Take 1,000 mg by mouth daily.   . Tiotropium Bromide Monohydrate (SPIRIVA RESPIMAT) 2.5 MCG/ACT AERS Inhale 2 puffs into the lungs every morning.  . zinc gluconate 50 MG tablet Take 50 mg by mouth daily.    PHQ 2/9 Scores 08/28/2020 07/26/2020 01/19/2020 09/07/2019  PHQ - 2 Score 0 0 0 0  PHQ- 9 Score 0 - 0 0    GAD 7 : Generalized Anxiety Score 08/28/2020 01/19/2020  Nervous, Anxious, on Edge 0 0  Control/stop worrying 0 0  Worry too much - different things 0 0  Trouble relaxing 0 0  Restless 0 0  Easily annoyed or irritable 0 0  Afraid - awful might happen 0 0  Total GAD 7 Score 0 0  Anxiety Difficulty - Not difficult at all    BP Readings from Last 3 Encounters:  08/28/20 126/72  08/18/20 116/61  01/19/20 120/78    Physical Exam Vitals and nursing note reviewed.  Constitutional:      General: She is not in acute distress.    Appearance: She is well-developed.  HENT:     Head: Normocephalic and atraumatic.  Neck:     Vascular: No carotid bruit.  Cardiovascular:     Rate and Rhythm: Normal rate and regular rhythm.     Pulses: Normal pulses.     Heart sounds: No murmur heard.   Pulmonary:     Effort: Pulmonary effort is normal. No respiratory distress.     Breath sounds: Examination of the right-upper field reveals  wheezing. Decreased breath sounds and wheezing present. No rhonchi.  Musculoskeletal:        General: Normal range of motion.     Cervical back: Normal range of motion.  Lymphadenopathy:     Cervical: No cervical adenopathy.  Skin:    General: Skin is warm and dry.     Findings: No rash.  Neurological:     Mental Status: She is alert and oriented to person, place, and time.  Psychiatric:        Attention and Perception: Attention normal.        Mood and Affect: Mood normal.        Behavior: Behavior normal.        Thought Content:  Thought content normal.     Wt Readings from Last 3 Encounters:  08/28/20 126 lb (57.2 kg)  08/17/20 124 lb 9 oz (56.5 kg)  03/29/20 130 lb (59 kg)    BP 126/72   Pulse 77   Temp 98.1 F (36.7 C) (Oral)   Ht 5\' 3"  (1.6 m)   Wt 126 lb (57.2 kg)   SpO2 92%   BMI 22.32 kg/m   Assessment and Plan: 1. COPD exacerbation ([place]) Completed Zpak and steroid taper Did not get nebulizer - will give Rx for nebulizer and tubing to take to local pharmacy Stop Spiriva and begin Trelegy 200 - one puff daily See Dr. Raul Del Pulmonology this week - Fluticasone-Umeclidin-Vilant (TRELEGY ELLIPTA) 200-62.5-25 MCG/INH AEPB; Inhale 1 puff into the lungs daily.  Dispense: 1 each; Refill: 5  2. Aortic atherosclerosis ([place]) On statin therapy daily   Partially dictated using Editor, commissioning. Any errors are unintentional.  Halina Maidens, MD [facility]  08/28/2020

## 2020-08-28 NOTE — Patient Instructions (Signed)
If you start the Trelegy, then stop the Spiriva.  Trelegy inhaler - one puff daily

## 2020-09-01 DIAGNOSIS — R0600 Dyspnea, unspecified: Secondary | ICD-10-CM | POA: Diagnosis not present

## 2020-09-01 DIAGNOSIS — J449 Chronic obstructive pulmonary disease, unspecified: Secondary | ICD-10-CM | POA: Diagnosis not present

## 2020-09-04 ENCOUNTER — Other Ambulatory Visit: Payer: Self-pay

## 2020-09-04 ENCOUNTER — Ambulatory Visit
Admission: RE | Admit: 2020-09-04 | Discharge: 2020-09-04 | Disposition: A | Discharge status: Home / Self Care | Payer: PPO | Source: Ambulatory Visit | Attending: Internal Medicine | Admitting: Internal Medicine

## 2020-09-04 DIAGNOSIS — Z780 Asymptomatic menopausal state: Secondary | ICD-10-CM | POA: Diagnosis not present

## 2020-09-04 DIAGNOSIS — M810 Age-related osteoporosis without current pathological fracture: Secondary | ICD-10-CM | POA: Diagnosis not present

## 2020-09-04 DIAGNOSIS — M4710 Other spondylosis with myelopathy, site unspecified: Secondary | ICD-10-CM

## 2020-09-04 DIAGNOSIS — M8588 Other specified disorders of bone density and structure, other site: Secondary | ICD-10-CM | POA: Diagnosis not present

## 2020-09-04 MED ORDER — ALENDRONATE SODIUM 70 MG PO TABS: 70.0000 mg | ORAL_TABLET | ORAL | 11 refills | Status: DC

## 2020-09-15 ENCOUNTER — Other Ambulatory Visit: Payer: Self-pay | Admitting: Family Medicine

## 2020-09-15 DIAGNOSIS — E785 Hyperlipidemia, unspecified: Secondary | ICD-10-CM

## 2020-09-24 DIAGNOSIS — J441 Chronic obstructive pulmonary disease with (acute) exacerbation: Secondary | ICD-10-CM | POA: Diagnosis not present

## 2020-10-10 ENCOUNTER — Encounter: Payer: Self-pay | Admitting: Internal Medicine

## 2020-10-10 ENCOUNTER — Ambulatory Visit (INDEPENDENT_AMBULATORY_CARE_PROVIDER_SITE_OTHER): Payer: PPO | Admitting: Internal Medicine

## 2020-10-10 VITALS — BP 124/72 | HR 58 | Temp 98.1°F | Ht 63.0 in | Wt 138.0 lb

## 2020-10-10 DIAGNOSIS — J449 Chronic obstructive pulmonary disease, unspecified: Secondary | ICD-10-CM

## 2020-10-10 DIAGNOSIS — I700 Atherosclerosis of aorta: Secondary | ICD-10-CM | POA: Diagnosis not present

## 2020-10-10 NOTE — Progress Notes (Signed)
Date:  10/10/2020   Name:  Gaetano HawthorneDonree Kearns Bankert   DOB:  12/15/1950   MRN:  [id number]   Chief Complaint: Hyperlipidemia (Start cholesterol med)  Hyperlipidemia This is a chronic problem. Recent lipid tests were reviewed and are high. Pertinent negatives include no chest pain or shortness of breath. Current antihyperlipidemic treatment includes statins (started after CT showed atherosclerosis).   COPD - now on Treligy and doing great.  No longer using her rescue inhaler twice a day.  The chronic mid chest congestion that was so aggravating has resolved, she can walk up a flight of stairs without needing to rest.    Lab Results  Component Value Date   CREATININE 0.84 08/18/2020   BUN 15 08/18/2020   NA 140 08/18/2020   K 3.8 08/18/2020   CL 107 08/18/2020   CO2 25 08/18/2020   Lab Results  Component Value Date   CHOL 261 (H) 01/19/2020   HDL 75 01/19/2020   LDLCALC 155 (H) 01/19/2020   TRIG 172 (H) 01/19/2020   CHOLHDL 3.5 01/19/2020   Lab Results  Component Value Date   TSH 1.570 01/19/2020   No results found for: HGBA1C Lab Results  Component Value Date   WBC 8.6 08/18/2020   HGB 12.4 08/18/2020   HCT 36.8 08/18/2020   MCV 91.3 08/18/2020   PLT 269 08/18/2020   Lab Results  Component Value Date   ALT 24 08/17/2020   AST 25 08/17/2020   ALKPHOS 74 08/17/2020   BILITOT 0.7 08/17/2020     Review of Systems  Constitutional: Negative for appetite change, fatigue, fever and unexpected weight change.  Respiratory: Negative for cough, chest tightness and shortness of breath.   Cardiovascular: Negative for chest pain, palpitations and leg swelling.  Gastrointestinal: Negative for abdominal pain, constipation and diarrhea.  Genitourinary: Negative for dysuria and hematuria.  Musculoskeletal: Negative for arthralgias.  Neurological: Negative for tremors, numbness and headaches.  Psychiatric/Behavioral: Negative for dysphoric mood.    Patient Active Problem  List   Diagnosis Date Noted  . COPD exacerbation (HCC) 08/17/2020  . Aortic atherosclerosis (HCC) 03/30/2020  . Hypercalcemia 07/10/2017  . Stage 2 moderate COPD by GOLD classification (HCC) 01/07/2017  . Ganglion cyst of wrist 07/02/2015  . Intermittent palpitations 07/02/2015  . Degenerative arthritis of spine with cord compression 07/02/2015  . Abdominal bloating 07/02/2015  . Baker's cyst of knee 07/02/2015  . Hyperlipidemia, mild 07/02/2015    Allergies  Allergen Reactions  . Sulfa Antibiotics Other (See Comments)    It was a long time ago, cannot remember reaction.    Past Surgical History:  Procedure Laterality Date  . ABDOMINAL HYSTERECTOMY    . CESAREAN SECTION    . COLONOSCOPY N/A 03/03/2015   Procedure: COLONOSCOPY;  Surgeon: Midge Miniumarren Wohl, MD;  [facility];  Service: Gastroenterology;  Laterality: N/A;  . POLYPECTOMY  03/03/2015   Procedure: POLYPECTOMY INTESTINAL;  Surgeon: Midge Miniumarren Wohl, MD;  [facility];  Service: Gastroenterology;;  . TUBAL LIGATION      Social History   Tobacco Use  . Smoking status: Former Smoker    Packs/day: 1.50    Years: 15.00    Pack years: 22.50    Types: Cigarettes    Quit date: 2012    Years since quitting: 10.0  . Smokeless tobacco: Never Used  Vaping Use  . Vaping Use: Never used  Substance Use Topics  . Alcohol use: No    Alcohol/week: 0.0 standard drinks  .  Drug use: No     Medication list has been reviewed and updated.  Current Meds  Medication Sig  . albuterol (PROVENTIL HFA;VENTOLIN HFA) 108 (90 Base) MCG/ACT inhaler Inhale 2 puffs into the lungs every 6 (six) hours as needed for wheezing or shortness of breath.  Marland Kitchen alendronate (FOSAMAX) 70 MG tablet Take 1 tablet (70 mg total) by mouth every 7 (seven) days. Take with a full glass of water on an empty stomach.  . Ascorbic Acid (VITAMIN C) 1000 MG tablet Take 1,000 mg by mouth every other day.   Marland Kitchen atorvastatin (LIPITOR) 10 MG tablet  TAKE 1 TABLET BY MOUTH EVERY DAY  . Cholecalciferol 1.25 MG (50000 UT) capsule Take 5,000 Units by mouth daily.  . Fluticasone-Umeclidin-Vilant (TRELEGY ELLIPTA) 200-62.5-25 MCG/INH AEPB Inhale 1 puff into the lungs daily.  Marland Kitchen ipratropium-albuterol (DUONEB) 0.5-2.5 (3) MG/3ML SOLN Take 3 mLs by nebulization every 6 (six) hours as needed.  . Omega-3 Fatty Acids (FISH OIL) 1000 MG CAPS Take 1,000 mg by mouth daily.   Marland Kitchen zinc gluconate 50 MG tablet Take 50 mg by mouth daily.    PHQ 2/9 Scores 10/10/2020 08/28/2020 07/26/2020 01/19/2020  PHQ - 2 Score 0 0 0 0  PHQ- 9 Score 0 0 - 0    GAD 7 : Generalized Anxiety Score 10/10/2020 08/28/2020 01/19/2020  Nervous, Anxious, on Edge 0 0 0  Control/stop worrying 0 0 0  Worry too much - different things 0 0 0  Trouble relaxing 0 0 0  Restless 0 0 0  Easily annoyed or irritable 0 0 0  Afraid - awful might happen 0 0 0  Total GAD 7 Score 0 0 0  Anxiety Difficulty - - Not difficult at all    BP Readings from Last 3 Encounters:  10/10/20 124/72  08/28/20 126/72  08/18/20 116/61    Physical Exam Vitals and nursing note reviewed.  Constitutional:      General: She is not in acute distress.    Appearance: Normal appearance. She is well-developed.  HENT:     Head: Normocephalic and atraumatic.  Neck:     Vascular: No carotid bruit.  Cardiovascular:     Rate and Rhythm: Normal rate and regular rhythm.     Pulses: Normal pulses.  Pulmonary:     Effort: Pulmonary effort is normal. No respiratory distress.     Breath sounds: Normal breath sounds. No wheezing or rhonchi.  Abdominal:     General: Abdomen is flat.     Palpations: Abdomen is soft. There is no mass.     Tenderness: There is no abdominal tenderness. There is no guarding or rebound.  Musculoskeletal:     Cervical back: Normal range of motion.     Right lower leg: No edema.     Left lower leg: No edema.  Lymphadenopathy:     Cervical: No cervical adenopathy.  Skin:    General: Skin  is warm and dry.     Findings: No rash.  Neurological:     Mental Status: She is alert and oriented to person, place, and time.  Psychiatric:        Mood and Affect: Mood and affect normal.        Behavior: Behavior normal.        Thought Content: Thought content normal.     Wt Readings from Last 3 Encounters:  10/10/20 138 lb (62.6 kg)  08/28/20 126 lb (57.2 kg)  08/17/20 124 lb 9 oz (56.5 kg)  BP 124/72   Pulse (!) 58   Temp 98.1 F (36.7 C) (Oral)   Ht 5\' 3"  (1.6 m)   Wt 138 lb (62.6 kg)   SpO2 97%   BMI 24.45 kg/m   Assessment and Plan: 1. Aortic atherosclerosis (HCC) Now on statin therapy - no side effects to medications noted Continue healthy low fat diet Continue Lipitor 10 mg  - Lipid panel  2. Stage 2 moderate COPD by GOLD classification ([place]) Much improved symptomatically on Trelegy Follow up with Pulmonary as planned for pulmonary nodules and COPD   Partially dictated using Dragon software. Any errors are unintentional.  Halina Maidens, MD [facility]  10/10/2020

## 2020-10-11 LAB — LIPID PANEL
Chol/HDL Ratio: 1.8 ratio (ref 0.0–4.4)
Cholesterol, Total: 225 mg/dL — ABNORMAL HIGH (ref 100–199)
HDL: 123 mg/dL (ref >39)
LDL Chol Calc (NIH): 91 mg/dL (ref 0–99)
Triglycerides: 62 mg/dL (ref 0–149)
VLDL Cholesterol Cal: 11 mg/dL (ref 5–40)

## 2020-10-25 DIAGNOSIS — J441 Chronic obstructive pulmonary disease with (acute) exacerbation: Secondary | ICD-10-CM | POA: Diagnosis not present

## 2020-10-31 ENCOUNTER — Telehealth: Payer: Self-pay

## 2020-10-31 NOTE — Telephone Encounter (Signed)
Tried calling patient to let her know she needs to call [facility] and cancel services herself so they can pick up supplies. Unable to reach patient at this time. Will try again.

## 2020-10-31 NOTE — Telephone Encounter (Unsigned)
Copied from [place] [phone]. Topic: General - Other >> Oct 31, 2020  9:53 AM Keene Breath wrote: Reason for CRM: Patient called to ask the doctor to contact the company that is supplying her oxygen tank to cancel the order.  Patient stated she is no longer needing it and does not want to continue to be billed for it.  The doctor needs to contact [facility], Fax: [phone], Acct. [id number]. If there are any questions, please call patient at [phone]

## 2020-11-07 ENCOUNTER — Telehealth: Payer: Self-pay

## 2020-11-07 NOTE — Telephone Encounter (Signed)
Spoke to pt let her know that she would have to call Dr. Vella Kohler (pulmonologist) to cancel the order. Pt verbalized understanding.  KP

## 2020-11-07 NOTE — Telephone Encounter (Unsigned)
Copied from [place] [phone]. Topic: General - Other >> Oct 31, 2020  9:53 AM Keene Breath wrote: Reason for CRM: Patient called to ask the doctor to contact the company that is supplying her oxygen tank to cancel the order.  Patient stated she is no longer needing it and does not want to continue to be billed for it.  The doctor needs to contact [facility], Fax: [phone], Acct. [id number]. If there are any questions, please call patient at [phone] >> Nov 07, 2020  9:34 AM Yvette Rack wrote: Pt stated [facility] will not stop billing her for the supplies until she provides a release from the doctor. Pt requests call back

## 2020-11-09 ENCOUNTER — Encounter: Payer: Self-pay | Admitting: Internal Medicine

## 2020-11-09 ENCOUNTER — Telehealth: Payer: Self-pay

## 2020-11-09 NOTE — Telephone Encounter (Signed)
Copied from [place] [phone]. Topic: General - Other >> Nov 09, 2020  9:06 AM Leward Quan A wrote: Reason for CRM: Patient  called in to speak to Dr Army Melia states that she have a personal medical question to ask. And need to speak with Dr Army Melia, Please advise Ph# [phone]

## 2020-11-09 NOTE — Telephone Encounter (Signed)
Called and spoke with patient. She said She went to the hospital on 08/17/20. Was seen by Dr. Army Melia on 08/28/20 for a hospital follow up. Patient said Dr. Army Melia addressed her oxygen use at this hospital follow up and its documented in the note and on her my chart where she can see this note. Patient said she spoke with her insurance company and [employer] who said a letter is required from PCP to be Faxed to [employer] stating that patient " no longer needs oxygen and is being discharged from using this equipment." Patient said she has not used her oxygen since the first week of December and no longer needs it. [facility] will continue to bill her until they receive this letter from PCP.   FAX# [phone] ([facility]) Please advise.

## 2020-11-09 NOTE — Telephone Encounter (Signed)
Letter written and ready to be faxed.

## 2020-11-10 NOTE — Telephone Encounter (Signed)
Faxed letter. Patient informed of letter faxed.

## 2020-11-29 ENCOUNTER — Other Ambulatory Visit: Payer: Self-pay | Admitting: Internal Medicine

## 2020-11-29 DIAGNOSIS — J441 Chronic obstructive pulmonary disease with (acute) exacerbation: Secondary | ICD-10-CM

## 2020-11-29 NOTE — Telephone Encounter (Signed)
Requested medication (s) are due for refill today: yes  Requested medication (s) are on the active medication list: yes  Last refill:  10/02/2020  Future visit scheduled: yes  Notes to clinic:  Medication not assigned to a protocol, review manually.   Requested Prescriptions  Pending Prescriptions Disp [place] 200-62.5-25 MCG/INH AEPB [Pharmacy Med Name: TRELEGY ELLIPTA 200-62.5-25] 180 each 1    Sig: TAKE 1 PUFF BY MOUTH EVERY DAY      Off-Protocol Failed - 11/29/2020 11:16 AM      Failed - Medication not assigned to a protocol, review manually.      Passed - Valid encounter within last 12 months    Recent Outpatient Visits           1 month ago Aortic atherosclerosis [facility])   [facility] Glean Hess, MD   3 months ago [facility] exacerb[facility])   [facility] Glean Hess, MD   10 months ago Annual physical exam   [facility] Glean Hess, MD   1 year ago COPD with acute exacerbation [facility])   [facility] Glean Hess, MD   1 year ago COPD with exacerbation [facility])   [facility] Glean Hess, MD       Future Appointments             In 1 month Army Melia Jesse Sans, MD [facility]

## 2020-12-07 DIAGNOSIS — Z01818 Encounter for other preprocedural examination: Secondary | ICD-10-CM | POA: Diagnosis not present

## 2020-12-07 DIAGNOSIS — J449 Chronic obstructive pulmonary disease, unspecified: Secondary | ICD-10-CM | POA: Diagnosis not present

## 2020-12-07 DIAGNOSIS — R0600 Dyspnea, unspecified: Secondary | ICD-10-CM | POA: Diagnosis not present

## 2021-01-21 DIAGNOSIS — M810 Age-related osteoporosis without current pathological fracture: Secondary | ICD-10-CM | POA: Insufficient documentation

## 2021-01-21 NOTE — Progress Notes (Deleted)
Date:  01/22/2021   Name:  Eileen Mills   DOB:  1951/09/09   MRN:  [id number]   Chief Complaint: No chief complaint on file.  Eileen Mills is a 70 y.o. female who presents today for her Complete Annual Exam. She feels {DESC; WELL/FAIRLY WELL/POORLY:18703}. She reports exercising ***. She reports she is sleeping {DESC; WELL/FAIRLY WELL/POORLY:18703}. Breast complaints ***.  Mammogram: 05/2020 DEXA: 05/2020 osteoporosis of hip/osteopenia of spine Colonoscopy: 03/2015 repeat 2026  Immunization History  Administered Date(s) Administered  . Influenza Inj Mdck Quad Pf 06/23/2018  . Influenza, High Dose Seasonal PF 07/17/2019, 07/16/2020  . Influenza,inj,Quad PF,6+ Mos 07/07/2015, 07/10/2017  . Influenza-Unspecified 06/30/2016, 07/17/2019  . PFIZER(Purple Top)SARS-COV-2 Vaccination 04/14/2020, 05/15/2020  . Pneumococcal Conjugate-13 01/18/2019  . Pneumococcal Polysaccharide-23 01/07/2017  . Tdap 01/05/2016  . Zoster 12/22/2013    Hyperlipidemia This is a chronic problem. The problem is controlled. Recent lipid tests were reviewed and are normal. Pertinent negatives include no chest pain or shortness of breath. Current antihyperlipidemic treatment includes statins. The current treatment provides significant improvement of lipids. There are no compliance problems.   COPD - stage II by Gold criteria.  On Trelegy MDI with good response.  PFTs 11/2020:  Moderate obstruction Lung volumes are in normal dlco is moderately dexcresaed Seen by Dr. Raul Del and continued on Trelegy. Tobacco use - quit in 2012.  Participating in LDCT lung cancer screening. Osteoporosis - on Fosamax since December without side effects.  Dexa done in December showed osteoporosis of the hip.  Lab Results  Component Value Date   CREATININE 0.84 08/18/2020   BUN 15 08/18/2020   NA 140 08/18/2020   K 3.8 08/18/2020   CL 107 08/18/2020   CO2 25 08/18/2020   Lab Results  Component Value Date    CHOL 225 (H) 10/10/2020   HDL 123 10/10/2020   LDLCALC 91 10/10/2020   TRIG 62 10/10/2020   CHOLHDL 1.8 10/10/2020   Lab Results  Component Value Date   TSH 1.570 01/19/2020   No results found for: HGBA1C Lab Results  Component Value Date   WBC 8.6 08/18/2020   HGB 12.4 08/18/2020   HCT 36.8 08/18/2020   MCV 91.3 08/18/2020   PLT 269 08/18/2020   Lab Results  Component Value Date   ALT 24 08/17/2020   AST 25 08/17/2020   ALKPHOS 74 08/17/2020   BILITOT 0.7 08/17/2020     Review of Systems  Constitutional: Negative for chills, fatigue and fever.  HENT: Negative for congestion, hearing loss, tinnitus, trouble swallowing and voice change.   Eyes: Negative for visual disturbance.  Respiratory: Negative for cough, chest tightness, shortness of breath and wheezing.   Cardiovascular: Negative for chest pain, palpitations and leg swelling.  Gastrointestinal: Negative for abdominal pain, constipation, diarrhea and vomiting.  Endocrine: Negative for polydipsia and polyuria.  Genitourinary: Negative for dysuria, frequency, genital sores, vaginal bleeding and vaginal discharge.  Musculoskeletal: Negative for arthralgias, gait problem and joint swelling.  Skin: Negative for color change and rash.  Neurological: Negative for dizziness, tremors, light-headedness and headaches.  Hematological: Negative for adenopathy. Does not bruise/bleed easily.  Psychiatric/Behavioral: Negative for dysphoric mood and sleep disturbance. The patient is not nervous/anxious.     Patient Active Problem List   Diagnosis Date Noted  . Aortic atherosclerosis ([place]) 03/30/2020  . Hypercalcemia 07/10/2017  . Stage 2 moderate COPD by GOLD classification ([place]) 01/07/2017  . Ganglion cyst of wrist 07/02/2015  . Intermittent palpitations 07/02/2015  .  Degenerative arthritis of spine with cord compression 07/02/2015  . Abdominal bloating 07/02/2015  . Baker's cyst of knee 07/02/2015  . Hyperlipidemia, mild  07/02/2015    Allergies  Allergen Reactions  . Sulfa Antibiotics Other (See Comments)    It was a long time ago, cannot remember reaction.    Past Surgical History:  Procedure Laterality Date  . ABDOMINAL HYSTERECTOMY    . CESAREAN SECTION    . COLONOSCOPY N/A 03/03/2015   Procedure: COLONOSCOPY;  Surgeon: Lucilla Lame, MD;  Location: [place];  Service: Gastroenterology;  Laterality: N/A;  . POLYPECTOMY  03/03/2015   Procedure: POLYPECTOMY INTESTINAL;  Surgeon: Lucilla Lame, MD;  Location: [place];  Service: Gastroenterology;;  . TUBAL LIGATION      Social History   Tobacco Use  . Smoking status: Former Smoker    Packs/day: 1.50    Years: 15.00    Pack years: 22.50    Types: Cigarettes    Quit date: 2012    Years since quitting: 10.3  . Smokeless tobacco: Never Used  Vaping Use  . Vaping Use: Never used  Substance Use Topics  . Alcohol use: No    Alcohol/week: 0.0 standard drinks  . Drug use: No     Medication list has been reviewed and updated.  No outpatient medications have been marked as taking for the 01/22/21 encounter (Appointment) with Glean Hess, MD.    [facility] 2/9 Scores 10/10/2020 08/28/2020 07/26/2020 01/19/2020  PHQ - 2 Score 0 0 0 0  PHQ- 9 Score 0 0 - 0    GAD 7 : Generalized Anxiety Score 10/10/2020 08/28/2020 01/19/2020  Nervous, Anxious, on Edge 0 0 0  Control/stop worrying 0 0 0  Worry too much - different things 0 0 0  Trouble relaxing 0 0 0  Restless 0 0 0  Easily annoyed or irritable 0 0 0  Afraid - awful might happen 0 0 0  Total GAD 7 Score 0 0 0  Anxiety Difficulty - - Not difficult at all    BP Readings from Last 3 Encounters:  10/10/20 124/72  08/28/20 126/72  08/18/20 116/61    Physical Exam Vitals and nursing note reviewed.  Constitutional:      General: She is not in acute distress.    Appearance: She is well-developed.  HENT:     Head: Normocephalic and atraumatic.     Right Ear: Tympanic  membrane and ear canal normal.     Left Ear: Tympanic membrane and ear canal normal.     Nose:     Right Sinus: No maxillary sinus tenderness.     Left Sinus: No maxillary sinus tenderness.  Eyes:     General: No scleral icterus.       Right eye: No discharge.        Left eye: No discharge.     Conjunctiva/sclera: Conjunctivae normal.  Neck:     Thyroid: No thyromegaly.     Vascular: No carotid bruit.  Cardiovascular:     Rate and Rhythm: Normal rate and regular rhythm.     Pulses: Normal pulses.     Heart sounds: Normal heart sounds.  Pulmonary:     Effort: Pulmonary effort is normal. No respiratory distress.     Breath sounds: No wheezing.  Chest:  Breasts:     Right: No mass, nipple discharge, skin change or tenderness.     Left: No mass, nipple discharge, skin change or tenderness.  Abdominal:     General: Bowel sounds are normal.     Palpations: Abdomen is soft.     Tenderness: There is no abdominal tenderness.  Musculoskeletal:     Cervical back: Normal range of motion. No erythema.     Right lower leg: No edema.     Left lower leg: No edema.  Lymphadenopathy:     Cervical: No cervical adenopathy.  Skin:    General: Skin is warm and dry.     Findings: No rash.  Neurological:     Mental Status: She is alert and oriented to person, place, and time.     Cranial Nerves: No cranial nerve deficit.     Sensory: No sensory deficit.     Deep Tendon Reflexes: Reflexes are normal and symmetric.  Psychiatric:        Attention and Perception: Attention normal.        Mood and Affect: Mood normal.     Wt Readings from Last 3 Encounters:  10/10/20 138 lb (62.6 kg)  08/28/20 126 lb (57.2 kg)  08/17/20 124 lb 9 oz (56.5 kg)    There were no vitals taken for this visit.  Assessment and Plan:

## 2021-01-22 ENCOUNTER — Encounter: Payer: PPO | Admitting: Internal Medicine

## 2021-01-22 DIAGNOSIS — M810 Age-related osteoporosis without current pathological fracture: Secondary | ICD-10-CM

## 2021-01-22 DIAGNOSIS — Z0000 Encounter for general adult medical examination without abnormal findings: Secondary | ICD-10-CM

## 2021-01-22 DIAGNOSIS — J449 Chronic obstructive pulmonary disease, unspecified: Secondary | ICD-10-CM

## 2021-01-22 DIAGNOSIS — E785 Hyperlipidemia, unspecified: Secondary | ICD-10-CM

## 2021-03-13 ENCOUNTER — Other Ambulatory Visit: Payer: Self-pay | Admitting: Internal Medicine

## 2021-03-13 DIAGNOSIS — E785 Hyperlipidemia, unspecified: Secondary | ICD-10-CM

## 2021-03-21 ENCOUNTER — Ambulatory Visit (INDEPENDENT_AMBULATORY_CARE_PROVIDER_SITE_OTHER): Payer: PPO | Admitting: Internal Medicine

## 2021-03-21 ENCOUNTER — Encounter: Payer: Self-pay | Admitting: Internal Medicine

## 2021-03-21 ENCOUNTER — Other Ambulatory Visit: Payer: Self-pay

## 2021-03-21 VITALS — BP 108/72 | HR 71 | Temp 98.1°F | Ht 63.0 in | Wt 166.0 lb

## 2021-03-21 DIAGNOSIS — Z1231 Encounter for screening mammogram for malignant neoplasm of breast: Secondary | ICD-10-CM | POA: Diagnosis not present

## 2021-03-21 DIAGNOSIS — E785 Hyperlipidemia, unspecified: Secondary | ICD-10-CM

## 2021-03-21 DIAGNOSIS — Z0000 Encounter for general adult medical examination without abnormal findings: Secondary | ICD-10-CM | POA: Diagnosis not present

## 2021-03-21 DIAGNOSIS — J449 Chronic obstructive pulmonary disease, unspecified: Secondary | ICD-10-CM

## 2021-03-21 DIAGNOSIS — Z23 Encounter for immunization: Secondary | ICD-10-CM

## 2021-03-21 DIAGNOSIS — M810 Age-related osteoporosis without current pathological fracture: Secondary | ICD-10-CM | POA: Diagnosis not present

## 2021-03-21 NOTE — Progress Notes (Signed)
Date:  03/21/2021   Name:  Eileen Mills   DOB:  1951/03/25   MRN:  [id number]   Chief Complaint: Annual Exam (Breast exam no pap) Eileen Mills is a 70 y.o. female who presents today for her Complete Annual Exam. She feels well. She reports exercising walking x2 days a week. She reports she is sleeping well. Breast complaints none.  Mammogram: 05/2020 DEXA: 08/2020 Pap smear: discontinued Colonoscopy: 03/2015  Immunization History  Administered Date(s) Administered   Influenza Inj Mdck Quad Pf 06/23/2018   Influenza, High Dose Seasonal PF 07/17/2019, 07/16/2020   Influenza,inj,Quad PF,6+ Mos 07/07/2015, 07/10/2017   Influenza-Unspecified 06/30/2016, 07/17/2019   PFIZER(Purple Top)SARS-COV-2 Vaccination 04/14/2020, 05/15/2020   Pneumococcal Conjugate-13 01/18/2019   Pneumococcal Polysaccharide-23 01/07/2017   Tdap 01/05/2016   Zoster, Live 12/22/2013    Hyperlipidemia This is a chronic problem. The problem is controlled. Pertinent negatives include no chest pain or shortness of breath. Current antihyperlipidemic treatment includes statins.  COPD - on daily Trelegy; followed by Pulmonary. OP - on Alendronate weekly. No side effects such as dysphagia, gerd, bone pain.  Also taking calcium and vitamin D daily.   Lab Results  Component Value Date   CREATININE 0.84 08/18/2020   BUN 15 08/18/2020   NA 140 08/18/2020   K 3.8 08/18/2020   CL 107 08/18/2020   CO2 25 08/18/2020   Lab Results  Component Value Date   CHOL 225 (H) 10/10/2020   HDL 123 10/10/2020   LDLCALC 91 10/10/2020   TRIG 62 10/10/2020   CHOLHDL 1.8 10/10/2020   Lab Results  Component Value Date   TSH 1.570 01/19/2020   No results found for: HGBA1C Lab Results  Component Value Date   WBC 8.6 08/18/2020   HGB 12.4 08/18/2020   HCT 36.8 08/18/2020   MCV 91.3 08/18/2020   PLT 269 08/18/2020   Lab Results  Component Value Date   ALT 24 08/17/2020   AST 25 08/17/2020   ALKPHOS 74  08/17/2020   BILITOT 0.7 08/17/2020     Review of Systems  Constitutional:  Positive for unexpected weight change (eating sweets). Negative for chills, fatigue and fever.  HENT:  Negative for congestion, hearing loss, tinnitus, trouble swallowing and voice change.   Eyes:  Negative for visual disturbance.  Respiratory:  Negative for cough, chest tightness, shortness of breath and wheezing.   Cardiovascular:  Negative for chest pain, palpitations and leg swelling.  Gastrointestinal:  Negative for abdominal pain, constipation, diarrhea and vomiting.  Endocrine: Negative for polydipsia and polyuria.  Genitourinary:  Negative for dysuria, frequency, genital sores, vaginal bleeding and vaginal discharge.  Musculoskeletal:  Negative for arthralgias, gait problem and joint swelling.  Skin:  Negative for color change and rash.  Neurological:  Negative for dizziness, tremors, light-headedness and headaches.  Hematological:  Negative for adenopathy. Does not bruise/bleed easily.  Psychiatric/Behavioral:  Negative for dysphoric mood and sleep disturbance. The patient is not nervous/anxious.    Patient Active Problem List   Diagnosis Date Noted   Age-related osteoporosis without current pathological fracture 01/21/2021   Aortic atherosclerosis ([place]) 03/30/2020   Hypercalcemia 07/10/2017   Stage 2 moderate COPD by GOLD classification ([place]) 01/07/2017   Ganglion cyst of wrist 07/02/2015   Intermittent palpitations 07/02/2015   Degenerative arthritis of spine with cord compression 07/02/2015   Abdominal bloating 07/02/2015   Baker's cyst of knee 07/02/2015   Hyperlipidemia, mild 07/02/2015    Allergies  Allergen Reactions   Sulfa Antibiotics  Other (See Comments)    It was a long time ago, cannot remember reaction.    Past Surgical History:  Procedure Laterality Date   ABDOMINAL HYSTERECTOMY     CESAREAN SECTION     COLONOSCOPY N/A 03/03/2015   Procedure: COLONOSCOPY;  Surgeon: Lucilla Lame, MD;  Location: [facility];  Service: Gastroenterology;  Laterality: N/A;   POLYPECTOMY  03/03/2015   Procedure: POLYPECTOMY INTESTINAL;  Surgeon: Lucilla Lame, MD;  Location: [facility];  Service: Gastroenterology;;   TUBAL LIGATION      Social History   Tobacco Use   Smoking status: Former    Packs/day: 1.50    Years: 15.00    Pack years: 22.50    Types: Cigarettes    Quit date: 2012    Years since quitting: 10.4   Smokeless tobacco: Never  Vaping Use   Vaping Use: Never used  Substance Use Topics   Alcohol use: No    Alcohol/week: 0.0 standard drinks   Drug use: No     Medication list has been reviewed and updated.  Current Meds  Medication Sig   albuterol (PROVENTIL HFA;VENTOLIN HFA) 108 (90 Base) MCG/ACT inhaler Inhale 2 puffs into the lungs every 6 (six) hours as needed for wheezing or shortness of breath.   alendronate (FOSAMAX) 70 MG tablet Take 1 tablet (70 mg total) by mouth every 7 (seven) days. Take with a full glass of water on an empty stomach.   Ascorbic Acid (VITAMIN C) 1000 MG tablet Take 1,000 mg by mouth every other day.    atorvastatin (LIPITOR) 10 MG tablet TAKE 1 TABLET BY MOUTH EVERY DAY   Cholecalciferol 1.25 MG (50000 UT) capsule Take 5,000 Units by mouth daily.   Omega-3 Fatty Acids (FISH OIL) 1000 MG CAPS Take 1,000 mg by mouth daily.    TRELEGY ELLIPTA 100-62.5-25 MCG/INH AEPB Inhale 1 puff into the lungs daily.   zinc gluconate 50 MG tablet Take 50 mg by mouth daily.   [DISCONTINUED] ipratropium-albuterol (DUONEB) 0.5-2.5 (3) MG/3ML SOLN Take 3 mLs by nebulization every 6 (six) hours as needed.    PHQ 2/9 Scores 03/21/2021 10/10/2020 08/28/2020 07/26/2020  PHQ - 2 Score 0 0 0 0  PHQ- 9 Score 0 0 0 -    GAD 7 : Generalized Anxiety Score 03/21/2021 10/10/2020 08/28/2020 01/19/2020  Nervous, Anxious, on Edge 0 0 0 0  Control/stop worrying 0 0 0 0  Worry too much - different things 0 0 0 0  Trouble relaxing 0 0 0 0  Restless 0  0 0 0  Easily annoyed or irritable 0 0 0 0  Afraid - awful might happen 0 0 0 0  Total GAD 7 Score 0 0 0 0  Anxiety Difficulty - - - Not difficult at all    BP Readings from Last 3 Encounters:  03/21/21 108/72  10/10/20 124/72  08/28/20 126/72    Physical Exam Vitals and nursing note reviewed.  Constitutional:      General: She is not in acute distress.    Appearance: She is well-developed.  HENT:     Head: Normocephalic and atraumatic.     Right Ear: Tympanic membrane and ear canal normal.     Left Ear: Tympanic membrane and ear canal normal.     Nose:     Right Sinus: No maxillary sinus tenderness.     Left Sinus: No maxillary sinus tenderness.  Eyes:     General: No scleral icterus.  Right eye: No discharge.        Left eye: No discharge.     Conjunctiva/sclera: Conjunctivae normal.  Neck:     Thyroid: No thyromegaly.     Vascular: No carotid bruit.  Cardiovascular:     Rate and Rhythm: Normal rate and regular rhythm.     Pulses: Normal pulses.     Heart sounds: Normal heart sounds.  Pulmonary:     Effort: Pulmonary effort is normal. No respiratory distress.     Breath sounds: No wheezing.  Chest:  Breasts:    Right: No mass, nipple discharge, skin change or tenderness.     Left: No mass, nipple discharge, skin change or tenderness.  Abdominal:     General: Bowel sounds are normal.     Palpations: Abdomen is soft.     Tenderness: There is no abdominal tenderness.  Musculoskeletal:     Cervical back: Normal range of motion. No erythema.     Right lower leg: No edema.     Left lower leg: No edema.  Lymphadenopathy:     Cervical: No cervical adenopathy.  Skin:    General: Skin is warm and dry.     Findings: No rash.  Neurological:     Mental Status: She is alert and oriented to person, place, and time.     Cranial Nerves: No cranial nerve deficit.     Sensory: No sensory deficit.     Deep Tendon Reflexes: Reflexes are normal and symmetric.   Psychiatric:        Attention and Perception: Attention normal.        Mood and Affect: Mood normal.    Wt Readings from Last 3 Encounters:  03/21/21 166 lb (75.3 kg)  10/10/20 138 lb (62.6 kg)  08/28/20 126 lb (57.2 kg)    BP 108/72   Pulse 71   Temp 98.1 F (36.7 C) (Oral)   Ht 5\' 3"  (1.6 m)   Wt 166 lb (75.3 kg)   SpO2 94%   BMI 29.41 kg/m   Assessment and Plan: 1. Annual physical exam Normal exam except for weight gain due to overeating sweets Pt will work to improve diet - CBC with Differential/Platelet - TSH  2. Encounter for screening mammogram for breast cancer Patient to schedule at [facility]; Future  3. Stage 2 moderate COPD by GOLD classification ([place]) Followed by Pulmonary Doing very well on Trelegy  4. Hyperlipidemia, mild Continue atorvastatin 10 mg - Comprehensive metabolic panel - Lipid panel  5. Age-related osteoporosis without current pathological fracture Continue calcium and vitamin D Obtain levels and advise on supplementation - VITAMIN D 25 Hydroxy (Vit-D Deficiency, Fractures)  6. Need for vaccination for pneumococcus Final dose given today - Pneumococcal polysaccharide vaccine 23-valent greater than or equal to 2yo subcutaneous/IM   Partially dictated using Editor, commissioning. Any errors are unintentional.  Halina Maidens, MD [facility]  03/21/2021

## 2021-03-22 LAB — LIPID PANEL
Chol/HDL Ratio: 2 ratio (ref 0.0–4.4)
Cholesterol, Total: 192 mg/dL (ref 100–199)
HDL: 98 mg/dL (ref >39)
LDL Chol Calc (NIH): 77 mg/dL (ref 0–99)
Triglycerides: 95 mg/dL (ref 0–149)
VLDL Cholesterol Cal: 17 mg/dL (ref 5–40)

## 2021-03-22 LAB — CBC WITH DIFFERENTIAL/PLATELET
Basophils Absolute: 0.1 10*3/uL (ref 0.0–0.2)
Basos: 1 %
EOS (ABSOLUTE): 0.2 10*3/uL (ref 0.0–0.4)
Eos: 2 %
Hematocrit: 39.4 % (ref 34.0–46.6)
Hemoglobin: 13 g/dL (ref 11.1–15.9)
Immature Grans (Abs): 0 10*3/uL (ref 0.0–0.1)
Immature Granulocytes: 0 %
Lymphocytes Absolute: 1.9 10*3/uL (ref 0.7–3.1)
Lymphs: 25 %
MCH: 29.3 pg (ref 26.6–33.0)
MCHC: 33 g/dL (ref 31.5–35.7)
MCV: 89 fL (ref 79–97)
Monocytes Absolute: 0.5 10*3/uL (ref 0.1–0.9)
Monocytes: 7 %
Neutrophils Absolute: 5 10*3/uL (ref 1.4–7.0)
Neutrophils: 65 %
Platelets: 367 10*3/uL (ref 150–450)
RBC: 4.44 x10E6/uL (ref 3.77–5.28)
RDW: 12.3 % (ref 11.7–15.4)
WBC: 7.6 10*3/uL (ref 3.4–10.8)

## 2021-03-22 LAB — COMPREHENSIVE METABOLIC PANEL
ALT: 25 IU/L (ref 0–32)
AST: 18 IU/L (ref 0–40)
Albumin/Globulin Ratio: 2.1 (ref 1.2–2.2)
Albumin: 4.5 g/dL (ref 3.8–4.8)
Alkaline Phosphatase: 112 IU/L (ref 44–121)
BUN/Creatinine Ratio: 14 (ref 12–28)
BUN: 12 mg/dL (ref 8–27)
Bilirubin Total: 0.3 mg/dL (ref 0.0–1.2)
CO2: 25 mmol/L (ref 20–29)
Calcium: 9.6 mg/dL (ref 8.7–10.3)
Chloride: 101 mmol/L (ref 96–106)
Creatinine, Ser: 0.86 mg/dL (ref 0.57–1.00)
Globulin, Total: 2.1 g/dL (ref 1.5–4.5)
Glucose: 88 mg/dL (ref 65–99)
Potassium: 5 mmol/L (ref 3.5–5.2)
Sodium: 140 mmol/L (ref 134–144)
Total Protein: 6.6 g/dL (ref 6.0–8.5)
eGFR: 74 mL/min/{1.73_m2} (ref >59)

## 2021-03-22 LAB — VITAMIN D 25 HYDROXY (VIT D DEFICIENCY, FRACTURES): Vit D, 25-Hydroxy: 30.3 ng/mL (ref 30.0–100.0)

## 2021-03-22 LAB — TSH: TSH: 3.18 u[IU]/mL (ref 0.450–4.500)

## 2021-04-04 DIAGNOSIS — R0600 Dyspnea, unspecified: Secondary | ICD-10-CM | POA: Diagnosis not present

## 2021-04-04 DIAGNOSIS — J449 Chronic obstructive pulmonary disease, unspecified: Secondary | ICD-10-CM | POA: Diagnosis not present

## 2021-05-30 NOTE — Telephone Encounter (Signed)
complete

## 2021-06-28 ENCOUNTER — Other Ambulatory Visit: Payer: Self-pay

## 2021-06-28 ENCOUNTER — Ambulatory Visit
Admission: RE | Admit: 2021-06-28 | Discharge: 2021-06-28 | Disposition: A | Discharge status: Home / Self Care | Payer: PPO | Source: Ambulatory Visit | Attending: Internal Medicine | Admitting: Internal Medicine

## 2021-06-28 DIAGNOSIS — Z1231 Encounter for screening mammogram for malignant neoplasm of breast: Secondary | ICD-10-CM | POA: Insufficient documentation

## 2021-07-30 ENCOUNTER — Ambulatory Visit: Payer: PPO

## 2021-08-07 DIAGNOSIS — J439 Emphysema, unspecified: Secondary | ICD-10-CM | POA: Diagnosis not present

## 2021-08-07 DIAGNOSIS — R0609 Other forms of dyspnea: Secondary | ICD-10-CM | POA: Diagnosis not present

## 2021-08-08 ENCOUNTER — Other Ambulatory Visit: Payer: Self-pay | Admitting: Specialist

## 2021-08-08 ENCOUNTER — Other Ambulatory Visit (HOSPITAL_BASED_OUTPATIENT_CLINIC_OR_DEPARTMENT_OTHER): Payer: Self-pay | Admitting: Specialist

## 2021-08-08 DIAGNOSIS — J439 Emphysema, unspecified: Secondary | ICD-10-CM

## 2021-08-08 DIAGNOSIS — R0609 Other forms of dyspnea: Secondary | ICD-10-CM

## 2021-08-30 ENCOUNTER — Other Ambulatory Visit: Payer: Self-pay

## 2021-08-30 ENCOUNTER — Ambulatory Visit
Admission: RE | Admit: 2021-08-30 | Discharge: 2021-08-30 | Disposition: A | Discharge status: Home / Self Care | Payer: PPO | Source: Ambulatory Visit | Attending: Specialist | Admitting: Specialist

## 2021-08-30 DIAGNOSIS — R0609 Other forms of dyspnea: Secondary | ICD-10-CM | POA: Insufficient documentation

## 2021-08-30 DIAGNOSIS — J439 Emphysema, unspecified: Secondary | ICD-10-CM | POA: Diagnosis not present

## 2021-08-30 DIAGNOSIS — I700 Atherosclerosis of aorta: Secondary | ICD-10-CM | POA: Diagnosis not present

## 2021-09-07 ENCOUNTER — Other Ambulatory Visit: Payer: Self-pay | Admitting: Internal Medicine

## 2021-09-07 DIAGNOSIS — E785 Hyperlipidemia, unspecified: Secondary | ICD-10-CM

## 2021-09-07 NOTE — Telephone Encounter (Signed)
Requested Prescriptions  Pending Prescriptions Disp Refills  . atorvastatin (LIPITOR) 10 MG tablet [Pharmacy Med Name: ATORVASTATIN 10 MG TABLET] 90 tablet 1    Sig: TAKE 1 TABLET BY MOUTH EVERY DAY     Cardiovascular:  Antilipid - Statins Passed - 09/07/2021  1:28 AM      Passed - Total Cholesterol in normal range and within 360 days    Cholesterol, Total  Date Value Ref Range Status  03/21/2021 192 100 - 199 mg/dL Final         Passed - LDL in normal range and within 360 days    LDL Chol Calc (NIH)  Date Value Ref Range Status  03/21/2021 77 0 - 99 mg/dL Final         Passed - HDL in normal range and within 360 days    HDL  Date Value Ref Range Status  03/21/2021 98 >39 mg/dL Final         Passed - Triglycerides in normal range and within 360 days    Triglycerides  Date Value Ref Range Status  03/21/2021 95 0 - 149 mg/dL Final         Passed - Patient is not pregnant      Passed - Valid encounter within last 12 months    Recent Outpatient Visits          5 months ago Annual physical exam   [facility] Glean Hess, MD   11 months ago Aortic atherosclerosis [facility])   [facility] Glean Hess, MD   1 year ago COPD exacerbation [facility])   [facility] Glean Hess, MD   1 year ago Annual physical exam   [facility] Glean Hess, MD   2 years ago COPD with acute exacerbation [facility])   [place], Laura H, MD      Future Appointments            In 6 months Army Melia Jesse Sans, MD [facility], [facility]

## 2021-10-11 ENCOUNTER — Other Ambulatory Visit: Payer: Self-pay | Admitting: *Deleted

## 2021-10-11 DIAGNOSIS — Z87891 Personal history of nicotine dependence: Secondary | ICD-10-CM

## 2021-12-07 IMAGING — MG MM DIGITAL SCREENING BILAT W/ TOMO AND CAD
8 series · 8 of 24 positions shown · non-contrast
Comparison: Previous exam(s).

CLINICAL DATA: Screening.

EXAM:
DIGITAL SCREENING BILATERAL MAMMOGRAM WITH TOMOSYNTHESIS AND CAD
TECHNIQUE: Bilateral screening digital craniocaudal and mediolateral oblique
mammograms were obtained. Bilateral screening digital breast
tomosynthesis was performed. The images were evaluated with
computer-aided detection.

[L MLO synth-2D]
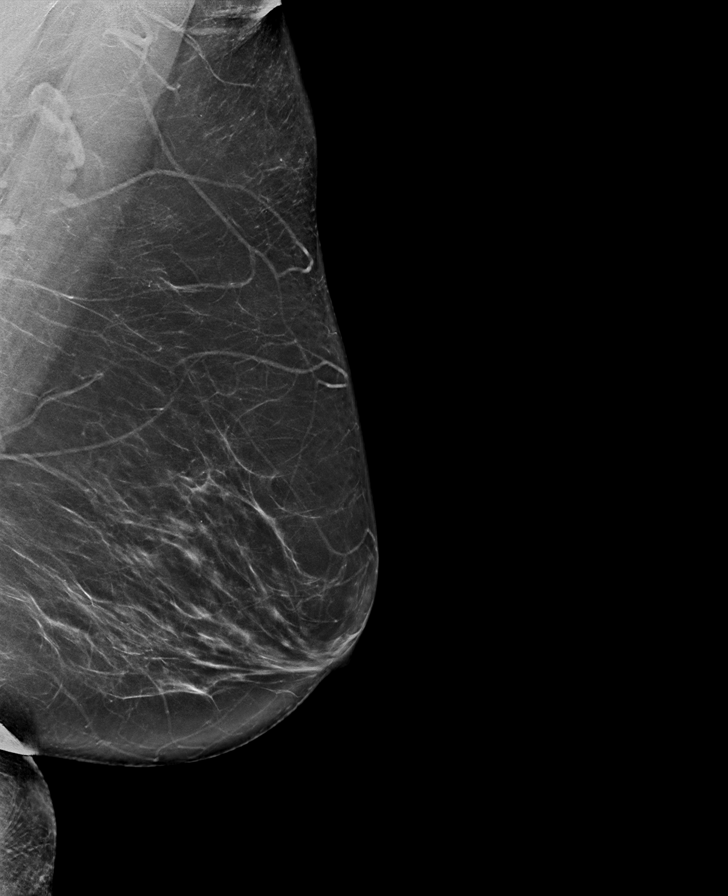

[R MLO synth-2D]
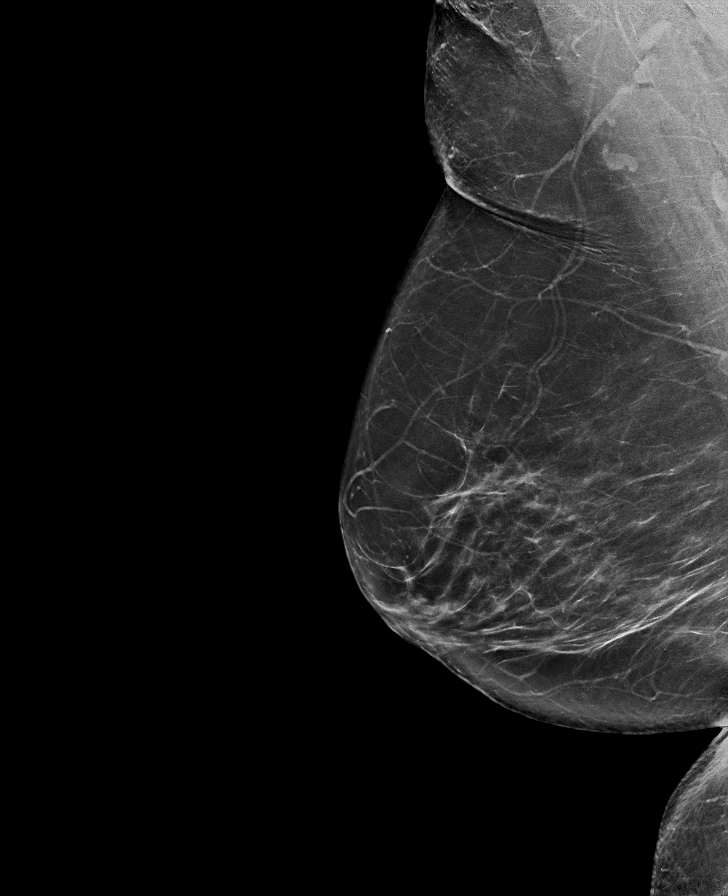

[L CC synth-2D]
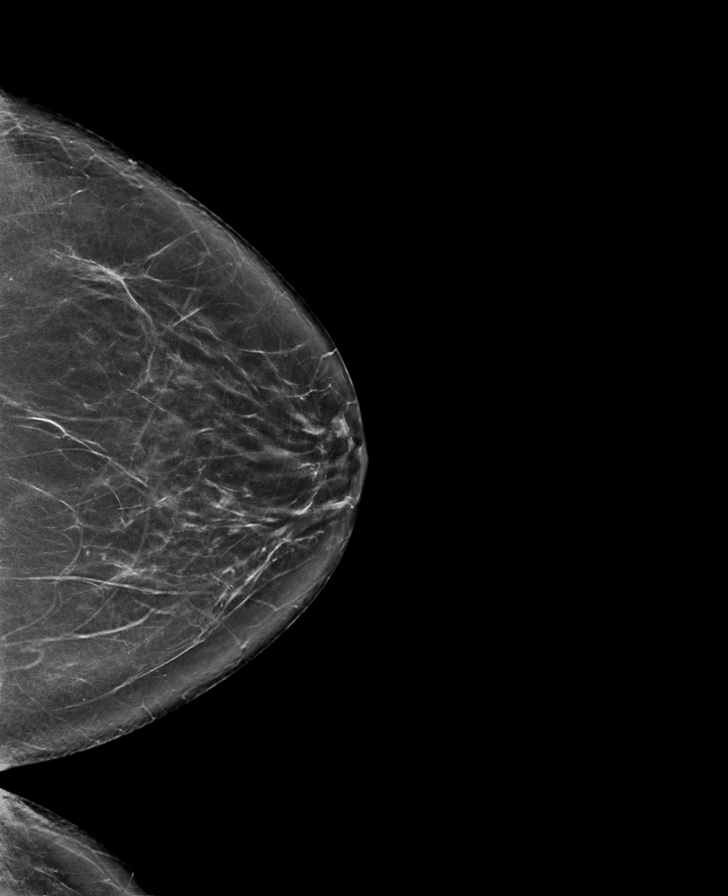

[R CC synth-2D]
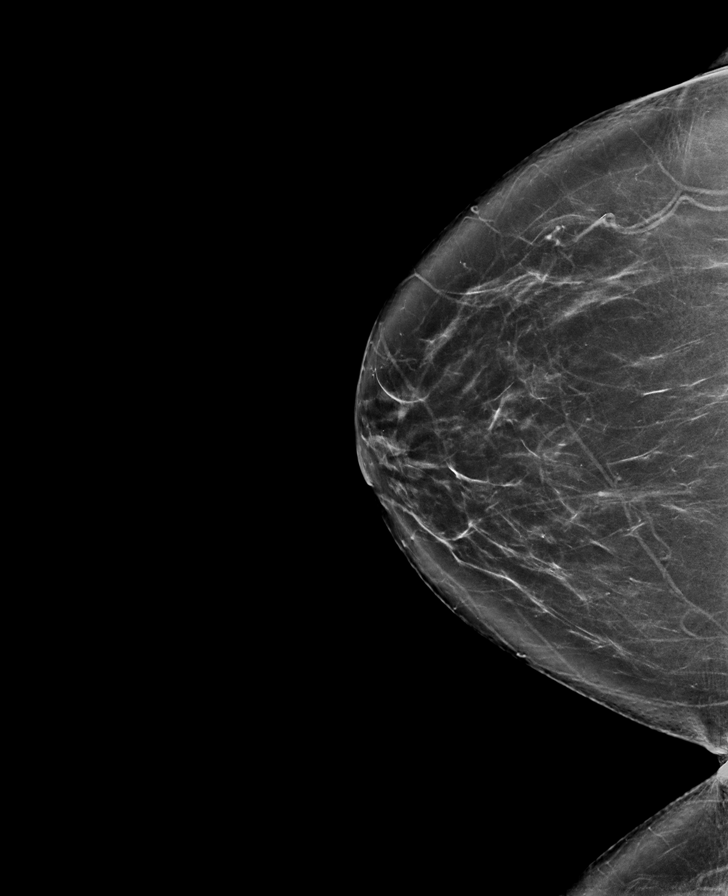

[R MLO tomo · tomo slice 41/80.0]
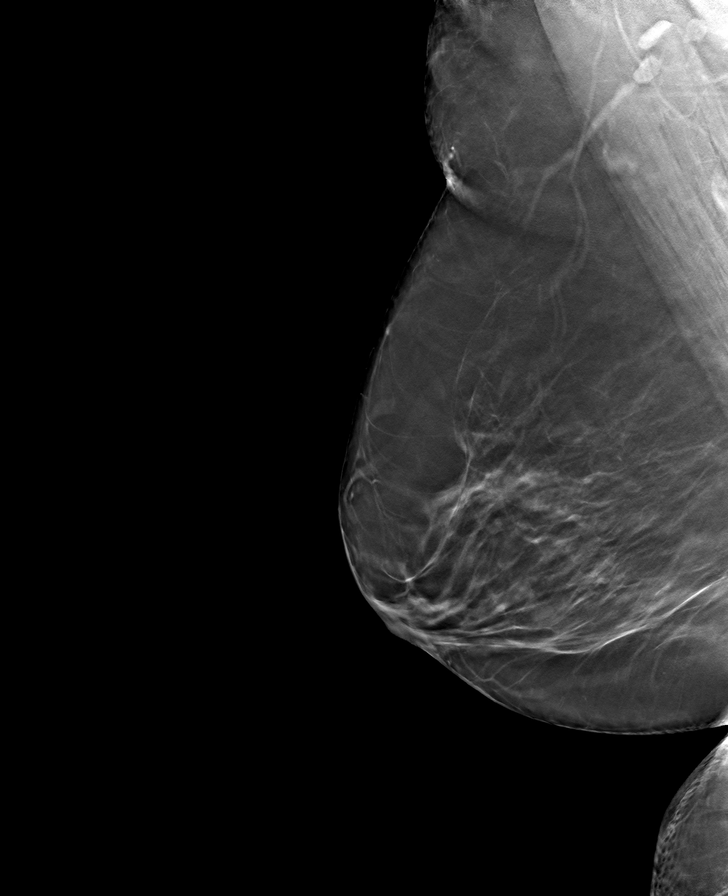

[L CC tomo · tomo slice 37/74.0]
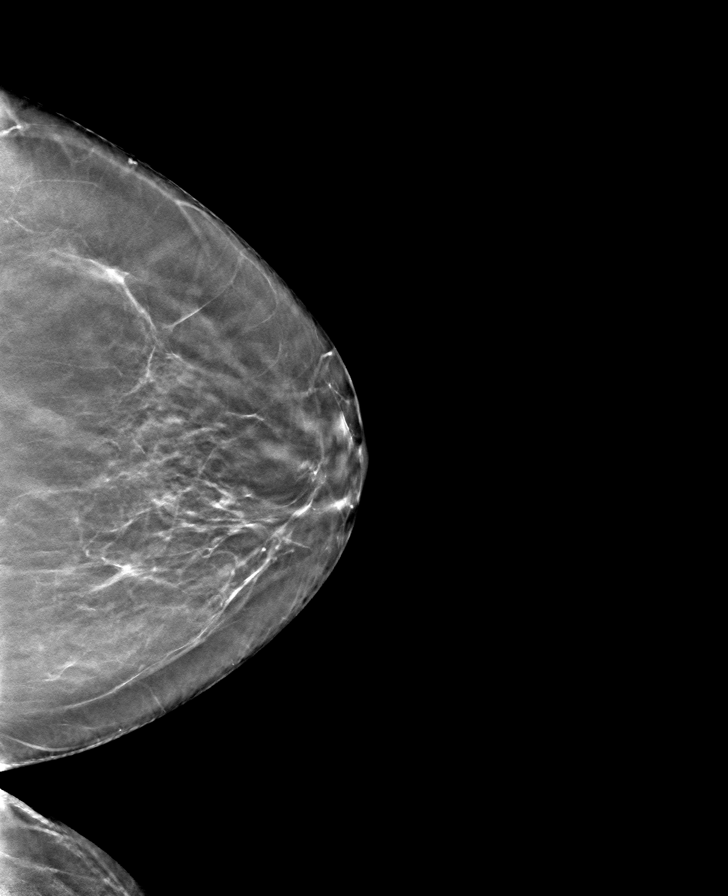

[L MLO tomo · tomo slice 41/81.0]
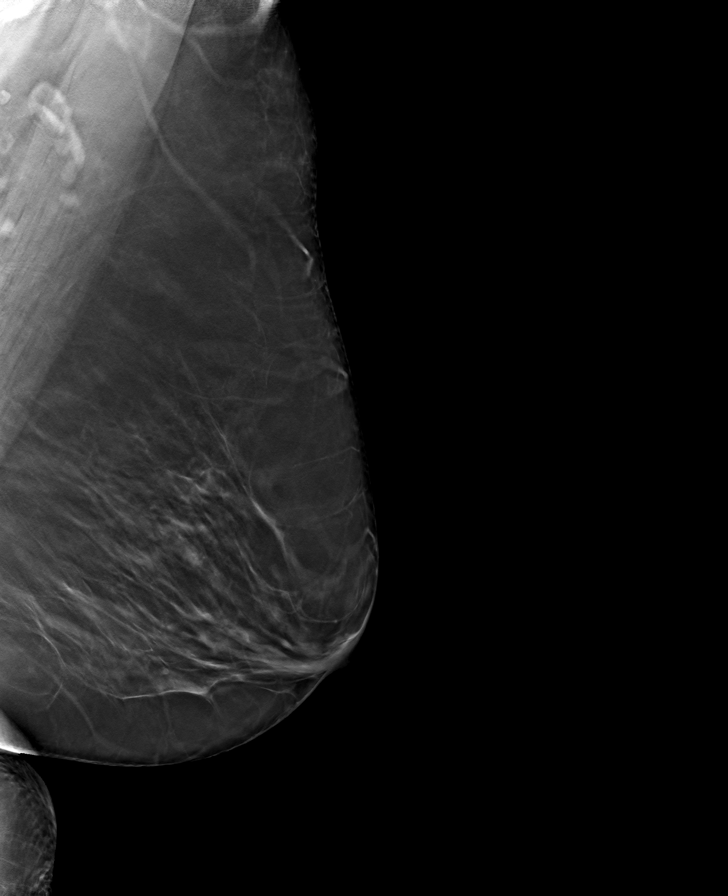

[R CC tomo · tomo slice 41/80.0]
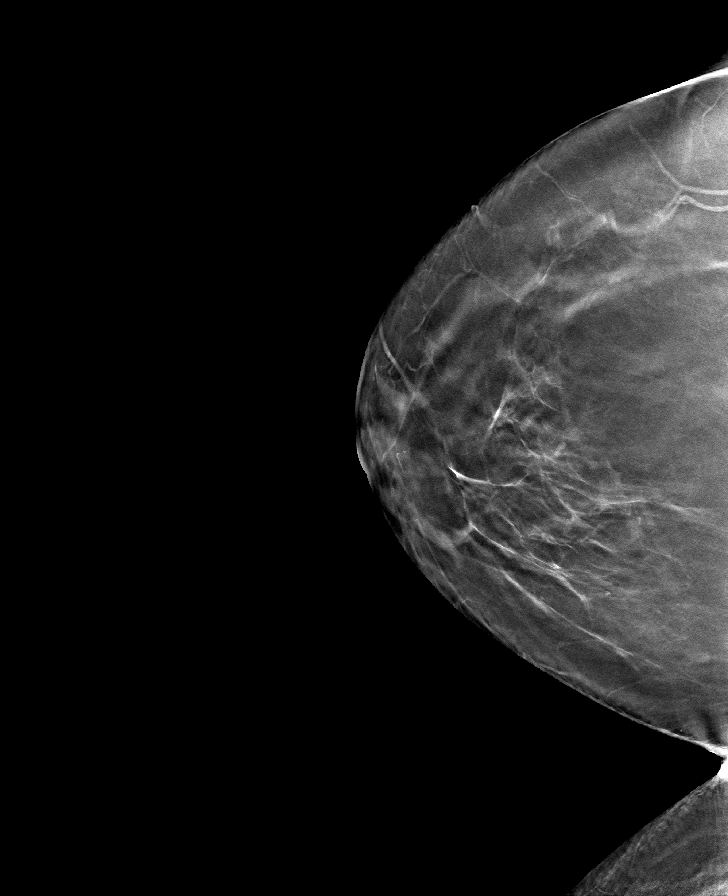

[8 of 24 positions shown; findings below may reference images not displayed]

ACR Breast Density Category b: There are scattered areas of
fibroglandular density.
FINDINGS: There are no findings suspicious for malignancy.
IMPRESSION: No mammographic evidence of malignancy. A result letter of this
screening mammogram will be mailed directly to the patient.

RECOMMENDATION:
Screening mammogram in one year. (Code:51-O-LD2)

BI-RADS CATEGORY  1: Negative.

## 2021-12-24 DIAGNOSIS — L304 Erythema intertrigo: Secondary | ICD-10-CM | POA: Diagnosis not present

## 2021-12-24 DIAGNOSIS — D2262 Melanocytic nevi of left upper limb, including shoulder: Secondary | ICD-10-CM | POA: Diagnosis not present

## 2021-12-24 DIAGNOSIS — D2272 Melanocytic nevi of left lower limb, including hip: Secondary | ICD-10-CM | POA: Diagnosis not present

## 2021-12-24 DIAGNOSIS — D2271 Melanocytic nevi of right lower limb, including hip: Secondary | ICD-10-CM | POA: Diagnosis not present

## 2021-12-24 DIAGNOSIS — D225 Melanocytic nevi of trunk: Secondary | ICD-10-CM | POA: Diagnosis not present

## 2021-12-24 DIAGNOSIS — L821 Other seborrheic keratosis: Secondary | ICD-10-CM | POA: Diagnosis not present

## 2021-12-24 DIAGNOSIS — D2261 Melanocytic nevi of right upper limb, including shoulder: Secondary | ICD-10-CM | POA: Diagnosis not present

## 2022-01-17 DIAGNOSIS — J449 Chronic obstructive pulmonary disease, unspecified: Secondary | ICD-10-CM | POA: Diagnosis not present

## 2022-01-18 ENCOUNTER — Encounter: Payer: Self-pay | Admitting: Internal Medicine

## 2022-01-18 ENCOUNTER — Ambulatory Visit (INDEPENDENT_AMBULATORY_CARE_PROVIDER_SITE_OTHER): Payer: PPO | Admitting: Internal Medicine

## 2022-01-18 VITALS — BP 100/64 | HR 83 | Temp 98.3°F | Ht 63.0 in | Wt 167.0 lb

## 2022-01-18 DIAGNOSIS — R6883 Chills (without fever): Secondary | ICD-10-CM

## 2022-01-18 DIAGNOSIS — I700 Atherosclerosis of aorta: Secondary | ICD-10-CM | POA: Diagnosis not present

## 2022-01-18 DIAGNOSIS — J439 Emphysema, unspecified: Secondary | ICD-10-CM

## 2022-01-18 DIAGNOSIS — R5383 Other fatigue: Secondary | ICD-10-CM | POA: Diagnosis not present

## 2022-01-18 LAB — POCT UA - MICROSCOPIC ONLY
Casts, Ur, LPF, POC: 0
Crystals, Ur, HPF, POC: 0
Mucus, UA: 0
RBC, Urine, Miroscopic: 0 (ref 0–2)
WBC, Ur, HPF, POC: 10 (ref 0–5)
Yeast, UA: 0

## 2022-01-18 LAB — POCT URINALYSIS DIPSTICK
Bilirubin, UA: NEGATIVE
Blood, UA: NEGATIVE
Glucose, UA: NEGATIVE
Ketones, UA: NEGATIVE
Nitrite, UA: POSITIVE
Protein, UA: NEGATIVE
Spec Grav, UA: 1.02 (ref 1.010–1.025)
Urobilinogen, UA: 0.2 E.U./dL
pH, UA: 6 (ref 5.0–8.0)

## 2022-01-18 MED ORDER — CEFUROXIME AXETIL 250 MG PO TABS: 250.0000 mg | ORAL_TABLET | Freq: Two times a day (BID) | ORAL | 0 refills | Status: AC

## 2022-01-18 NOTE — Progress Notes (Signed)
? ? ?Date:  01/18/2022  ? ?Name:  Eileen Mills   DOB:  08-May-1951   MRN:  [id number] ? ? ?Chief Complaint: Nausea (Chills, negative covid test 2 days ago, unchanged, fatigue, no fever) ? ?HPI ?Chills and fatigue - onset of symptoms on 01/07/22.  Chills and fatigue with mild nausea.  Negative Covid test 2 days ago.  No sore throat, sinus symptoms, cough or wheeze.  No diarrhea, abdominal pain, vomiting. No dysuria, no hx of diverticulitis.  Has not taken her temperature.  Has not taken Advil or tylenol.  Trying to drink sufficient fluids.  Eating sparingly. ?Lab Results  ?Component Value Date  ? NA 140 03/21/2021  ? K 5.0 03/21/2021  ? CO2 25 03/21/2021  ? GLUCOSE 88 03/21/2021  ? BUN 12 03/21/2021  ? CREATININE 0.86 03/21/2021  ? CALCIUM 9.6 03/21/2021  ? EGFR 74 03/21/2021  ? GFRNONAA >60 08/18/2020  ? ?Lab Results  ?Component Value Date  ? CHOL 192 03/21/2021  ? HDL 98 03/21/2021  ? [place] 77 03/21/2021  ? TRIG 95 03/21/2021  ? CHOLHDL 2.0 03/21/2021  ? ?Lab Results  ?Component Value Date  ? TSH 3.180 03/21/2021  ? ?No results found for: HGBA1C ?Lab Results  ?Component Value Date  ? WBC 7.6 03/21/2021  ? HGB 13.0 03/21/2021  ? HCT 39.4 03/21/2021  ? MCV 89 03/21/2021  ? PLT 367 03/21/2021  ? ?Lab Results  ?Component Value Date  ? ALT 25 03/21/2021  ? AST 18 03/21/2021  ? ALKPHOS 112 03/21/2021  ? BILITOT 0.3 03/21/2021  ? ?Lab Results  ?Component Value Date  ? VD25OH 30.3 03/21/2021  ?  ? ?Review of Systems  ?Constitutional:  Positive for chills, diaphoresis and fatigue. Negative for fever and unexpected weight change.  ?HENT:  Negative for congestion, sore throat and trouble swallowing.   ?Respiratory:  Negative for cough, chest tightness and shortness of breath.   ?Cardiovascular:  Negative for chest pain and palpitations.  ?Gastrointestinal:  Positive for nausea. Negative for abdominal pain, blood in stool, diarrhea and vomiting.  ?Genitourinary:  Negative for dysuria, frequency, pelvic pain, urgency  and vaginal discharge.  ?Musculoskeletal:  Negative for arthralgias and gait problem.  ?Neurological:  Positive for light-headedness. Negative for headaches.  ?Psychiatric/Behavioral:  Negative for dysphoric mood and sleep disturbance. The patient is not nervous/anxious.   ? ?Patient Active Problem List  ? Diagnosis Date Noted  ? Age-related osteoporosis without current pathological fracture 01/21/2021  ? Aortic atherosclerosis ([place]) 03/30/2020  ? Hypercalcemia 07/10/2017  ? Stage 2 moderate COPD by GOLD classification ([place]) 01/07/2017  ? Ganglion cyst of wrist 07/02/2015  ? Intermittent palpitations 07/02/2015  ? Degenerative arthritis of spine with cord compression 07/02/2015  ? Abdominal bloating 07/02/2015  ? Baker's cyst of knee 07/02/2015  ? Hyperlipidemia, mild 07/02/2015  ? ? ?Allergies  ?Allergen Reactions  ? Sulfa Antibiotics Other (See Comments)  ?  It was a long time ago, cannot remember reaction.  ? ? ?Past Surgical History:  ?Procedure Laterality Date  ? ABDOMINAL HYSTERECTOMY    ? CESAREAN SECTION    ? COLONOSCOPY N/A 03/03/2015  ? Procedure: COLONOSCOPY;  Surgeon: Lucilla Lame, MD;  Location: [facility];  Service: Gastroenterology;  Laterality: N/A;  ? POLYPECTOMY  03/03/2015  ? Procedure: POLYPECTOMY INTESTINAL;  Surgeon: Lucilla Lame, MD;  Location: [facility];  Service: Gastroenterology;;  ? TUBAL LIGATION    ? ? ?Social History  ? ?Tobacco Use  ? Smoking status: Former  ?  Packs/day: 1.50  ?  Years: 15.00  ?  Pack years: 22.50  ?  Types: Cigarettes  ?  Quit date: 2012  ?  Years since quitting: 11.3  ? Smokeless tobacco: Never  ?Vaping Use  ? Vaping Use: Never used  ?Substance Use Topics  ? Alcohol use: No  ?  Alcohol/week: 0.0 standard drinks  ? Drug use: No  ? ? ? ?Medication list has been reviewed and updated. ? ?Current Meds  ?Medication Sig  ? albuterol (PROVENTIL HFA;VENTOLIN HFA) 108 (90 Base) MCG/ACT inhaler Inhale 2 puffs into the lungs every 6 (six) hours as needed for  wheezing or shortness of breath.  ? Ascorbic Acid (VITAMIN C) 1000 MG tablet Take 1,000 mg by mouth every other day.   ? atorvastatin (LIPITOR) 10 MG tablet TAKE 1 TABLET BY MOUTH EVERY DAY  ? Cholecalciferol 1.25 MG (50000 UT) capsule Take 5,000 Units by mouth daily.  ? Omega-3 Fatty Acids (FISH OIL) 1000 MG CAPS Take 1,000 mg by mouth daily.   ? TRELEGY ELLIPTA 100-62.5-25 MCG/INH AEPB Inhale 1 puff into the lungs daily.  ? zinc gluconate 50 MG tablet Take 50 mg by mouth daily.  ? ? ? ?  01/18/2022  ? 10:42 AM 03/21/2021  ?  8:47 AM 10/10/2020  ?  9:47 AM 08/28/2020  ?  2:47 PM  ?GAD 7 : Generalized Anxiety Score  ?Nervous, Anxious, on Edge 0 0 0 0  ?Control/stop worrying 0 0 0 0  ?Worry too much - different things 0 0 0 0  ?Trouble relaxing 0 0 0 0  ?Restless 0 0 0 0  ?Easily annoyed or irritable 0 0 0 0  ?Afraid - awful might happen 0 0 0 0  ?Total GAD 7 Score 0 0 0 0  ? ? ? ?  01/18/2022  ? 10:41 AM  ?Depression screen PHQ 2/9  ?Decreased Interest 0  ?Down, Depressed, Hopeless 0  ?PHQ - 2 Score 0  ?Altered sleeping 0  ?Tired, decreased energy 3  ?Change in appetite 2  ?Feeling bad or failure about yourself  0  ?Trouble concentrating 0  ?Moving slowly or fidgety/restless 0  ?Suicidal thoughts 0  ?PHQ-9 Score 5  ?Difficult doing work/chores Not difficult at all  ? ? ?BP Readings from Last 3 Encounters:  ?01/18/22 100/64  ?03/21/21 108/72  ?10/10/20 124/72  ? ? ?Physical Exam ?Constitutional:   ?   General: She is not in acute distress. ?   Appearance: She is ill-appearing. She is not toxic-appearing.  ?Cardiovascular:  ?   Rate and Rhythm: Normal rate and regular rhythm.  ?   Pulses: Normal pulses.  ?Pulmonary:  ?   Effort: Pulmonary effort is normal.  ?   Breath sounds: Normal breath sounds. No wheezing or rhonchi.  ?Abdominal:  ?   General: There is no distension.  ?   Palpations: Abdomen is soft.  ?   Tenderness: There is no abdominal tenderness. There is no right CVA tenderness or left CVA tenderness.  ?    Hernia: No hernia is present.  ?Musculoskeletal:  ?   Cervical back: Normal range of motion.  ?   Right lower leg: No edema.  ?   Left lower leg: No edema.  ?Lymphadenopathy:  ?   Cervical: No cervical adenopathy.  ?Skin: ?   General: Skin is warm and dry.  ?   Capillary Refill: Capillary refill takes less than 2 seconds.  ?Neurological:  ?   Mental Status:  She is alert.  ? ? ?Wt Readings from Last 3 Encounters:  ?01/18/22 167 lb (75.8 kg)  ?03/21/21 166 lb (75.3 kg)  ?10/10/20 138 lb (62.6 kg)  ? ? ?BP 100/64   Pulse 83   Temp 98.3 ?F (36.8 ?C) (Oral)   Ht 5' 3"  (1.6 m)   Wt 167 lb (75.8 kg)   SpO2 95%   BMI 29.58 kg/m?  ? ?Assessment and Plan: ?1. Chills ?Suspect this is from an asymptomatic UTI ?Increase fluids and treat with Ceftin ?Will also cx to confirm appropriate therapy. ?Call or follow up if worsening ?- POCT urinalysis dipstick ?- POCT UA - Microscopic Only ?- Urine Culture ?- cefUROXime (CEFTIN) 250 MG tablet; Take 1 tablet (250 mg total) by mouth 2 (two) times daily with a meal for 7 days.  Dispense: 14 tablet; Refill: 0 ? ?2. Other fatigue ?Likely due to UTI ? ?3. Pulmonary emphysema, unspecified emphysema type ([place]) ?Recently seen by Pulmonary and doing well. ? ?4. Aortic atherosclerosis ([place]) ?On statin therapy. ? ? ?Partially dictated using Editor, commissioning. Any errors are unintentional. ? ?Halina Maidens, MD ?[facility] ?[facility] ? ?01/18/2022 ? ? ? ? ? ?

## 2022-01-23 LAB — URINE CULTURE

## 2022-03-09 ENCOUNTER — Other Ambulatory Visit: Payer: Self-pay | Admitting: Internal Medicine

## 2022-03-09 DIAGNOSIS — E785 Hyperlipidemia, unspecified: Secondary | ICD-10-CM

## 2022-03-11 NOTE — Telephone Encounter (Signed)
Requested Prescriptions  Pending Prescriptions Disp Refills  . atorvastatin (LIPITOR) 10 MG tablet [Pharmacy Med Name: ATORVASTATIN 10 MG TABLET] 90 tablet 0    Sig: TAKE 1 TABLET BY MOUTH EVERY DAY     Cardiovascular:  Antilipid - Statins Failed - 03/09/2022  1:25 AM      Failed - Lipid Panel in normal range within the last 12 months    Cholesterol, Total  Date Value Ref Range Status  03/21/2021 192 100 - 199 mg/dL Final   LDL Chol Calc (NIH)  Date Value Ref Range Status  03/21/2021 77 0 - 99 mg/dL Final   HDL  Date Value Ref Range Status  03/21/2021 98 >39 mg/dL Final   Triglycerides  Date Value Ref Range Status  03/21/2021 95 0 - 149 mg/dL Final         Passed - Patient is not pregnant      Passed - Valid encounter within last 12 months    Recent Outpatient Visits          1 month ago [facility] Glean Hess, MD   11 months ago Annual physical exam   [facility] Glean Hess, MD   1 year ago Aortic atherosclerosis [facility])   [facility] Glean Hess, MD   1 year ago COPD exacerbation [facility])   [facility] Glean [facility], [facility]   2 years ago Annual physical exam   [facility] Glean Hess, MD      Future Appointments            In 1 week Army Melia Jesse Sans, MD [facility], [facility]

## 2022-03-21 ENCOUNTER — Encounter: Payer: Self-pay | Admitting: Internal Medicine

## 2022-03-21 ENCOUNTER — Ambulatory Visit (INDEPENDENT_AMBULATORY_CARE_PROVIDER_SITE_OTHER): Payer: PPO | Admitting: Internal Medicine

## 2022-03-21 VITALS — BP 118/70 | HR 68 | Ht 63.0 in | Wt 166.0 lb

## 2022-03-21 DIAGNOSIS — E785 Hyperlipidemia, unspecified: Secondary | ICD-10-CM | POA: Diagnosis not present

## 2022-03-21 DIAGNOSIS — Z1231 Encounter for screening mammogram for malignant neoplasm of breast: Secondary | ICD-10-CM

## 2022-03-21 DIAGNOSIS — I700 Atherosclerosis of aorta: Secondary | ICD-10-CM | POA: Diagnosis not present

## 2022-03-21 DIAGNOSIS — M810 Age-related osteoporosis without current pathological fracture: Secondary | ICD-10-CM | POA: Diagnosis not present

## 2022-03-21 DIAGNOSIS — Z0000 Encounter for general adult medical examination without abnormal findings: Secondary | ICD-10-CM

## 2022-03-21 DIAGNOSIS — Z23 Encounter for immunization: Secondary | ICD-10-CM | POA: Diagnosis not present

## 2022-03-21 DIAGNOSIS — J449 Chronic obstructive pulmonary disease, unspecified: Secondary | ICD-10-CM

## 2022-03-21 DIAGNOSIS — K219 Gastro-esophageal reflux disease without esophagitis: Secondary | ICD-10-CM | POA: Diagnosis not present

## 2022-03-21 MED ORDER — SHINGRIX 50 MCG/0.5ML IM SUSR: 0.5000 mL | Freq: Once | INTRAMUSCULAR | 1 refills | Status: AC

## 2022-03-22 LAB — CBC WITH DIFFERENTIAL/PLATELET
Basophils Absolute: 0.1 10*3/uL (ref 0.0–0.2)
Basos: 1 %
EOS (ABSOLUTE): 0.1 10*3/uL (ref 0.0–0.4)
Eos: 2 %
Hematocrit: 39.1 % (ref 34.0–46.6)
Hemoglobin: 13 g/dL (ref 11.1–15.9)
Immature Grans (Abs): 0 10*3/uL (ref 0.0–0.1)
Immature Granulocytes: 0 %
Lymphocytes Absolute: 3.2 10*3/uL — ABNORMAL HIGH (ref 0.7–3.1)
Lymphs: 45 %
MCH: 29.7 pg (ref 26.6–33.0)
MCHC: 33.2 g/dL (ref 31.5–35.7)
MCV: 90 fL (ref 79–97)
Monocytes Absolute: 0.5 10*3/uL (ref 0.1–0.9)
Monocytes: 7 %
Neutrophils Absolute: 3.3 10*3/uL (ref 1.4–7.0)
Neutrophils: 45 %
Platelets: 284 10*3/uL (ref 150–450)
RBC: 4.37 x10E6/uL (ref 3.77–5.28)
RDW: 13.6 % (ref 11.7–15.4)
WBC: 7.1 10*3/uL (ref 3.4–10.8)

## 2022-03-22 LAB — COMPREHENSIVE METABOLIC PANEL
ALT: 44 IU/L — ABNORMAL HIGH (ref 0–32)
AST: 31 IU/L (ref 0–40)
Albumin/Globulin Ratio: 2 (ref 1.2–2.2)
Albumin: 4.6 g/dL (ref 3.8–4.8)
Alkaline Phosphatase: 99 IU/L (ref 44–121)
BUN/Creatinine Ratio: 10 — ABNORMAL LOW (ref 12–28)
BUN: 10 mg/dL (ref 8–27)
Bilirubin Total: 0.3 mg/dL (ref 0.0–1.2)
CO2: 24 mmol/L (ref 20–29)
Calcium: 10.2 mg/dL (ref 8.7–10.3)
Chloride: 102 mmol/L (ref 96–106)
Creatinine, Ser: 0.96 mg/dL (ref 0.57–1.00)
Globulin, Total: 2.3 g/dL (ref 1.5–4.5)
Glucose: 94 mg/dL (ref 70–99)
Potassium: 4.9 mmol/L (ref 3.5–5.2)
Sodium: 141 mmol/L (ref 134–144)
Total Protein: 6.9 g/dL (ref 6.0–8.5)
eGFR: 64 mL/min/{1.73_m2} (ref >59)

## 2022-03-22 LAB — LIPID PANEL
Chol/HDL Ratio: 2.2 ratio (ref 0.0–4.4)
Cholesterol, Total: 204 mg/dL — ABNORMAL HIGH (ref 100–199)
HDL: 93 mg/dL (ref >39)
LDL Chol Calc (NIH): 92 mg/dL (ref 0–99)
Triglycerides: 113 mg/dL (ref 0–149)
VLDL Cholesterol Cal: 19 mg/dL (ref 5–40)

## 2022-03-22 LAB — TSH: TSH: 2.85 u[IU]/mL (ref 0.450–4.500)

## 2022-03-22 LAB — VITAMIN D 25 HYDROXY (VIT D DEFICIENCY, FRACTURES): Vit D, 25-Hydroxy: 29.7 ng/mL — ABNORMAL LOW (ref 30.0–100.0)

## 2022-06-08 ENCOUNTER — Other Ambulatory Visit: Payer: Self-pay | Admitting: Internal Medicine

## 2022-06-08 DIAGNOSIS — E785 Hyperlipidemia, unspecified: Secondary | ICD-10-CM

## 2022-06-11 NOTE — Telephone Encounter (Signed)
Requested Prescriptions  Pending Prescriptions Disp Refills  . atorvastatin (LIPITOR) 10 MG tablet [Pharmacy Med Name: ATORVASTATIN 10 MG TABLET] 90 tablet 0    Sig: TAKE 1 TABLET BY MOUTH EVERY DAY     Cardiovascular:  Antilipid - Statins Failed - 06/08/2022  1:20 AM      Failed - Lipid Panel in normal range within the last 12 months    Cholesterol, Total  Date Value Ref Range Status  03/21/2022 204 (H) 100 - 199 mg/dL Final   LDL Chol Calc (NIH)  Date Value Ref Range Status  03/21/2022 92 0 - 99 mg/dL Final   HDL  Date Value Ref Range Status  03/21/2022 93 >39 mg/dL Final   Triglycerides  Date Value Ref Range Status  03/21/2022 113 0 - 149 mg/dL Final         Passed - Patient is not pregnant      Passed - Valid encounter within last 12 months    Recent Outpatient Visits          2 months ago Annual physical exam   [place] [facility] at [facility], Jesse Sans, MD   4 months ago [place] [facility] at [facility], Jesse Sans, MD   1 year ago Annual physical exam   [place] [facility] at [facility], Jesse Sans, MD   1 year ago Aortic atherosclerosis [facility])   [place] [facility] at [facility], Jesse Sans, MD   1 year ago COPD exacerbation [facility])    Primary Care and Sports Medicine at [facility], Jesse Sans, MD      Future Appointments            In 9 months Army Melia, Jesse Sans, MD [place] [facility] at [facility], [facility]

## 2022-06-19 DIAGNOSIS — J449 Chronic obstructive pulmonary disease, unspecified: Secondary | ICD-10-CM | POA: Diagnosis not present

## 2022-07-01 ENCOUNTER — Ambulatory Visit
Admission: RE | Admit: 2022-07-01 | Discharge: 2022-07-01 | Disposition: A | Discharge status: Home / Self Care | Payer: PPO | Source: Ambulatory Visit | Attending: Internal Medicine | Admitting: Internal Medicine

## 2022-07-01 DIAGNOSIS — Z1231 Encounter for screening mammogram for malignant neoplasm of breast: Secondary | ICD-10-CM | POA: Diagnosis not present

## 2022-07-23 ENCOUNTER — Other Ambulatory Visit: Payer: Self-pay | Admitting: Internal Medicine

## 2022-07-23 DIAGNOSIS — E785 Hyperlipidemia, unspecified: Secondary | ICD-10-CM

## 2022-07-24 NOTE — Telephone Encounter (Signed)
Requested Prescriptions  Pending Prescriptions Disp Refills  . atorvastatin (LIPITOR) 10 MG tablet [Pharmacy Med Name: ATORVASTATIN 10 MG TABLET] 90 tablet 2    Sig: TAKE 1 TABLET BY MOUTH EVERY DAY     Cardiovascular:  Antilipid - Statins Failed - 07/23/2022 12:18 PM      Failed - Lipid Panel in normal range within the last 12 months    Cholesterol, Total  Date Value Ref Range Status  03/21/2022 204 (H) 100 - 199 mg/dL Final   LDL Chol Calc (NIH)  Date Value Ref Range Status  03/21/2022 92 0 - 99 mg/dL Final   HDL  Date Value Ref Range Status  03/21/2022 93 >39 mg/dL Final   Triglycerides  Date Value Ref Range Status  03/21/2022 113 0 - 149 mg/dL Final         Passed - Patient is not pregnant      Passed - Valid encounter within last 12 months    Recent Outpatient Visits          4 months ago Annual physical exam   [place] [facility] at [facility], Jesse Sans, MD   6 months ago [place] [facility] at [facility], Jesse Sans, MD   1 year ago Annual physical exam   [place] [facility] at [facility], Jesse Sans, MD   1 year ago Aortic atherosclerosis [facility])   [place] [facility] at [facility], Jesse Sans, MD   1 year ago COPD exacerbation [facility])   [place] [facility] at [facility], Jesse Sans, MD      Future Appointments            In 8 months Army Melia, Jesse Sans, MD [facility] Primary Care and Sports Medicine at [facility], [facility]

## 2022-08-05 DIAGNOSIS — J449 Chronic obstructive pulmonary disease, unspecified: Secondary | ICD-10-CM | POA: Diagnosis not present

## 2022-08-05 DIAGNOSIS — R058 Other specified cough: Secondary | ICD-10-CM | POA: Diagnosis not present

## 2022-08-30 ENCOUNTER — Ambulatory Visit: Payer: PPO

## 2022-08-30 ENCOUNTER — Ambulatory Visit
Admission: RE | Admit: 2022-08-30 | Discharge: 2022-08-30 | Disposition: A | Discharge status: Home / Self Care | Payer: PPO | Source: Ambulatory Visit | Attending: Acute Care | Admitting: Acute Care

## 2022-08-30 DIAGNOSIS — Z87891 Personal history of nicotine dependence: Secondary | ICD-10-CM | POA: Diagnosis not present

## 2022-09-02 ENCOUNTER — Other Ambulatory Visit: Payer: Self-pay | Admitting: Acute Care

## 2022-09-02 DIAGNOSIS — Z122 Encounter for screening for malignant neoplasm of respiratory organs: Secondary | ICD-10-CM

## 2022-09-02 DIAGNOSIS — Z87891 Personal history of nicotine dependence: Secondary | ICD-10-CM

## 2022-11-19 DIAGNOSIS — Z87891 Personal history of nicotine dependence: Secondary | ICD-10-CM | POA: Diagnosis not present

## 2022-11-19 DIAGNOSIS — J449 Chronic obstructive pulmonary disease, unspecified: Secondary | ICD-10-CM | POA: Diagnosis not present

## 2023-03-20 DIAGNOSIS — J449 Chronic obstructive pulmonary disease, unspecified: Secondary | ICD-10-CM | POA: Diagnosis not present

## 2023-03-20 DIAGNOSIS — Z87891 Personal history of nicotine dependence: Secondary | ICD-10-CM | POA: Diagnosis not present

## 2023-03-24 ENCOUNTER — Ambulatory Visit (INDEPENDENT_AMBULATORY_CARE_PROVIDER_SITE_OTHER): Payer: PPO

## 2023-03-24 ENCOUNTER — Encounter: Payer: Self-pay | Admitting: Internal Medicine

## 2023-03-24 ENCOUNTER — Ambulatory Visit (INDEPENDENT_AMBULATORY_CARE_PROVIDER_SITE_OTHER): Payer: PPO | Admitting: Internal Medicine

## 2023-03-24 ENCOUNTER — Ambulatory Visit: Payer: Self-pay

## 2023-03-24 VITALS — BP 128/76 | HR 104 | Temp 98.4°F | Ht 63.0 in | Wt 170.0 lb

## 2023-03-24 DIAGNOSIS — Z0000 Encounter for general adult medical examination without abnormal findings: Secondary | ICD-10-CM | POA: Diagnosis not present

## 2023-03-24 DIAGNOSIS — J449 Chronic obstructive pulmonary disease, unspecified: Secondary | ICD-10-CM | POA: Diagnosis not present

## 2023-03-24 DIAGNOSIS — R1319 Other dysphagia: Secondary | ICD-10-CM

## 2023-03-24 DIAGNOSIS — I700 Atherosclerosis of aorta: Secondary | ICD-10-CM | POA: Diagnosis not present

## 2023-03-24 DIAGNOSIS — R300 Dysuria: Secondary | ICD-10-CM | POA: Diagnosis not present

## 2023-03-24 DIAGNOSIS — K5732 Diverticulitis of large intestine without perforation or abscess without bleeding: Secondary | ICD-10-CM

## 2023-03-24 DIAGNOSIS — R1310 Dysphagia, unspecified: Secondary | ICD-10-CM | POA: Insufficient documentation

## 2023-03-24 LAB — POCT URINALYSIS DIPSTICK
Bilirubin, UA: NEGATIVE
Blood, UA: NEGATIVE
Glucose, UA: NEGATIVE
Ketones, UA: NEGATIVE
Leukocytes, UA: NEGATIVE
Nitrite, UA: NEGATIVE
Protein, UA: NEGATIVE
Spec Grav, UA: >=1.03 — AB (ref 1.010–1.025)
Urobilinogen, UA: 0.2 E.U./dL
pH, UA: 6 (ref 5.0–8.0)

## 2023-03-24 MED ORDER — AMOXICILLIN-POT CLAVULANATE 875-125 MG PO TABS
1.0000 | ORAL_TABLET | Freq: Two times a day (BID) | ORAL | 0 refills | Status: AC
Start: 2023-03-24 — End: 2023-04-03

## 2023-03-24 NOTE — Patient Instructions (Addendum)
Eileen Mills , Thank you for taking time to come for your Medicare Wellness Visit. I appreciate your ongoing commitment to your health goals. Please review the following plan we discussed and let me know if I can assist you in the future.   These are the goals we discussed:  Goals      DIET - INCREASE WATER INTAKE     Drink more water daily.     Increase physical activity     Recommend increasing physical activity to at least 3 days per week        This is a list of the screening recommended for you and due dates:  Health Maintenance  Topic Date Due   Zoster (Shingles) Vaccine (1 of 2) 07/08/2003   Flu Shot  05/01/2023   Mammogram  07/02/2023   Screening for Lung Cancer  08/31/2023   Medicare Annual Wellness Visit  03/23/2024   Colon Cancer Screening  03/02/2025   DTaP/Tdap/Td vaccine (2 - Td or Tdap) 01/04/2026   Pneumonia Vaccine  Completed   DEXA scan (bone density measurement)  Completed   Hepatitis C Screening  Completed   HPV Vaccine  Aged Out   COVID-19 Vaccine  Discontinued     Health Maintenance After Age 50 After age 88, you are at a higher risk for certain long-term diseases and infections as well as injuries from falls. Falls are a major cause of broken bones and head injuries in people who are older than age 49. Getting regular preventive care can help to keep you healthy and well. Preventive care includes getting regular testing and making lifestyle changes as recommended by your health care provider. Talk with your health care provider about: Which screenings and tests you should have. A screening is a test that checks for a disease when you have no symptoms. A diet and exercise plan that is right for you. What should I know about screenings and tests to prevent falls? Screening and testing are the best ways to find a health problem early. Early diagnosis and treatment give you the best chance of managing medical conditions that are common after age 54. Certain  conditions and lifestyle choices may make you more likely to have a fall. Your health care provider may recommend: Regular vision checks. Poor vision and conditions such as cataracts can make you more likely to have a fall. If you wear glasses, make sure to get your prescription updated if your vision changes. Medicine review. Work with your health care provider to regularly review all of the medicines you are taking, including over-the-counter medicines. Ask your health care provider about any side effects that may make you more likely to have a fall. Tell your health care provider if any medicines that you take make you feel dizzy or sleepy. Strength and balance checks. Your health care provider may recommend certain tests to check your strength and balance while standing, walking, or changing positions. Foot health exam. Foot pain and numbness, as well as not wearing proper footwear, can make you more likely to have a fall. Screenings, including: Osteoporosis screening. Osteoporosis is a condition that causes the bones to get weaker and break more easily. Blood pressure screening. Blood pressure changes and medicines to control blood pressure can make you feel dizzy. Depression screening. You may be more likely to have a fall if you have a fear of falling, feel depressed, or feel unable to do activities that you used to do. Alcohol use screening. Using too much  alcohol can affect your balance and may make you more likely to have a fall. Follow these instructions at home: Lifestyle Do not drink alcohol if: Your health care provider tells you not to drink. If you drink alcohol: Limit how much you have to: 0-1 drink a day for women. 0-2 drinks a day for men. Know how much alcohol is in your drink. In the [place] one drink equals one 12 oz bottle of beer (355 mL), one 5 oz glass of wine (148 mL), or one 1 oz glass of hard liquor (44 mL). Do not use any products that contain nicotine or tobacco.  These products include cigarettes, chewing tobacco, and vaping devices, such as e-cigarettes. If you need help quitting, ask your health care provider. Activity  Follow a regular exercise program to stay fit. This will help you maintain your balance. Ask your health care provider what types of exercise are appropriate for you. If you need a cane or walker, use it as recommended by your health care provider. Wear supportive shoes that have nonskid soles. Safety  Remove any tripping hazards, such as rugs, cords, and clutter. Install safety equipment such as grab bars in bathrooms and safety rails on stairs. Keep rooms and walkways well-lit. General instructions Talk with your health care provider about your risks for falling. Tell your health care provider if: You fall. Be sure to tell your health care provider about all falls, even ones that seem minor. You feel dizzy, tiredness (fatigue), or off-balance. Take over-the-counter and prescription medicines only as told by your health care provider. These include supplements. Eat a healthy diet and maintain a healthy weight. A healthy diet includes low-fat dairy products, low-fat (lean) meats, and fiber from whole grains, beans, and lots of fruits and vegetables. Stay current with your vaccines. Schedule regular health, dental, and eye exams. Summary Having a healthy lifestyle and getting preventive care can help to protect your health and wellness after age 69. Screening and testing are the best way to find a health problem early and help you avoid having a fall. Early diagnosis and treatment give you the best chance for managing medical conditions that are more common for people who are older than age 29. Falls are a major cause of broken bones and head injuries in people who are older than age 15. Take precautions to prevent a fall at home. Work with your health care provider to learn what changes you can make to improve your health and wellness  and to prevent falls. This information is not intended to replace advice given to you by your health care provider. Make sure you discuss any questions you have with your health care provider. Document Revised: 02/05/2021 Document Reviewed: 02/05/2021 Elsevier Patient Education  2024 [employer].

## 2023-03-24 NOTE — Assessment & Plan Note (Signed)
On Trelegy and albuterol Followed by Pulmonary

## 2023-03-24 NOTE — Telephone Encounter (Signed)
Chief Complaint: Diarrhea  Symptoms: diarrhea, stomach ache and chills Frequency: onset this morning with 10 loose stools Pertinent Negatives: Patient denies blood in stool, cramps, and fever Disposition: [] ED /[] Urgent Care (no appt availability in office) / [x] Appointment(In office/virtual)/ []  [facility]/ [] Home Care/ [] Refused Recommended Disposition /[] [facility]/ []  Follow-up with PCP Additional Notes: Patient states she went to bed last night with a dull constant abdominal pain. When she woke up this morning she passed a hard stool followed by watery stool. The watery stool has continued throughout the day with the patient reporting at least 10 watery stools. Patient also stated that she feels cold and has been under a blanket but does not have a fever.  Patient given care advised and schedule for an office visit today. Patient agreed to disposition.   Summary: diarrhea and chills   Pt called in has diarrhea and chills     Reason for Disposition  [1] SEVERE diarrhea (e.g., 7 or more times / day more than normal) AND [2] age > 60 years  Answer Assessment - Initial Assessment Questions 1. DIARRHEA SEVERITY: "How bad is the diarrhea?" "How many more stools have you had in the past 24 hours than normal?"    - NO DIARRHEA (SCALE 0)   - MILD (SCALE 1-3): Few loose or mushy BMs; increase of 1-3 stools over normal daily number of stools; mild increase in ostomy output.   -  MODERATE (SCALE 4-7): Increase of 4-6 stools daily over normal; moderate increase in ostomy output.   -  SEVERE (SCALE 8-10; OR "WORST POSSIBLE"): Increase of 7 or more stools daily over normal; moderate increase in ostomy output; incontinence.     Severe 2. ONSET: "When did the diarrhea begin?"      This morning 3. BM CONSISTENCY: "How loose or watery is the diarrhea?"      First portion exteremly hard than turned watery 4. VOMITING: "Are you also vomiting?" If Yes, ask: "How many times in the  past 24 hours?"      No, but felt nausea 5. ABDOMEN PAIN: "Are you having any abdomen pain?" If Yes, ask: "What does it feel like?" (e.g., crampy, dull, intermittent, constant)      Dull constant ache 6. ABDOMEN PAIN SEVERITY: If present, ask: "How bad is the pain?"  (e.g., Scale 1-10; mild, moderate, or severe)   - MILD (1-3): doesn't interfere with normal activities, abdomen soft and not tender to touch    - MODERATE (4-7): interferes with normal activities or awakens from sleep, abdomen tender to touch    - SEVERE (8-10): excruciating pain, doubled over, unable to do any normal activities       6 or 7 out of 10 discomfort 7. ORAL INTAKE: If vomiting, "Have you been able to drink liquids?" "How much liquids have you had in the past 24 hours?"     I've been sipping on ginger ale 8. HYDRATION: "Any signs of dehydration?" (e.g., dry mouth [not just dry lips], too weak to stand, dizziness, new weight loss) "When did you last urinate?"     Mouth is dry  9. EXPOSURE: "Have you traveled to a foreign country recently?" "Have you been exposed to anyone with diarrhea?" "Could you have eaten any food that was spoiled?"     No 10. ANTIBIOTIC USE: "Are you taking antibiotics now or have you taken antibiotics in the past 2 months?"       No 11. OTHER SYMPTOMS: "Do  you have any other symptoms?" (e.g., fever, blood in stool)       No 12. PREGNANCY: "Is there any chance you are pregnant?" "When was your last menstrual period?"       N/A  Protocols used: [facility]

## 2023-03-24 NOTE — Progress Notes (Signed)
Date:  03/24/2023   Name:  Eileen Mills   DOB:  Jul 17, 1951   MRN:  [id number]   Chief Complaint: Diarrhea (Started this morning. Cough/ congestion. No production with cough. Dry Mouth. No fever.)  Diarrhea  This is a new problem. The current episode started today. The problem has been unchanged. The stool consistency is described as Watery. Associated symptoms include abdominal pain and chills. Pertinent negatives include no arthralgias, fever or vomiting.  Dysuria  This is a new problem. The current episode started in the past 7 days. The problem occurs every urination. The problem has been gradually worsening. The patient is experiencing no pain. There has been no fever (but chills and feels ill). Associated symptoms include chills and nausea. Pertinent negatives include no hematuria or vomiting. She has tried nothing for the symptoms.   Dysphagia - for some time she has been having issues with food sticking in her lower esophagus.  She will stop eating and it will often pass after a few minutes.  Occasionally she will have to regurgitate the food bolus.  No hx of ulcers, nsaid use.  Takes TUMs for reflux.  Lab Results  Component Value Date   NA 141 03/21/2022   K 4.9 03/21/2022   CO2 24 03/21/2022   GLUCOSE 94 03/21/2022   BUN 10 03/21/2022   CREATININE 0.96 03/21/2022   CALCIUM 10.2 03/21/2022   EGFR 64 03/21/2022   GFRNONAA >60 08/18/2020   Lab Results  Component Value Date   CHOL 204 (H) 03/21/2022   HDL 93 03/21/2022   LDLCALC 92 03/21/2022   TRIG 113 03/21/2022   CHOLHDL 2.2 03/21/2022   Lab Results  Component Value Date   TSH 2.850 03/21/2022   No results found for: "HGBA1C" Lab Results  Component Value Date   WBC 7.1 03/21/2022   HGB 13.0 03/21/2022   HCT 39.1 03/21/2022   MCV 90 03/21/2022   PLT 284 03/21/2022   Lab Results  Component Value Date   ALT 44 (H) 03/21/2022   AST 31 03/21/2022   ALKPHOS 99 03/21/2022   BILITOT 0.3 03/21/2022    Lab Results  Component Value Date   VD25OH 29.7 (L) 03/21/2022     Review of Systems  Constitutional:  Positive for chills and fatigue. Negative for fever.  HENT:  Positive for trouble swallowing.   Respiratory:  Positive for shortness of breath. Negative for chest tightness.   Cardiovascular:  Negative for chest pain and leg swelling.  Gastrointestinal:  Positive for abdominal pain, diarrhea and nausea. Negative for blood in stool and vomiting.  Genitourinary:  Positive for dysuria. Negative for hematuria.  Musculoskeletal:  Negative for arthralgias.    Patient Active Problem List   Diagnosis Date Noted   Esophageal dysphagia 03/24/2023   Age-related osteoporosis without current pathological fracture 01/21/2021   Aortic atherosclerosis (HCC) 03/30/2020   Hypercalcemia 07/10/2017   Stage 2 moderate COPD by GOLD classification (HCC) 01/07/2017   Ganglion cyst of wrist 07/02/2015   Intermittent palpitations 07/02/2015   Degenerative arthritis of spine with cord compression 07/02/2015   Abdominal bloating 07/02/2015   Baker's cyst of knee 07/02/2015   Hyperlipidemia, mild 07/02/2015    Allergies  Allergen Reactions   Sulfa Antibiotics Other (See Comments)    It was a long time ago, cannot remember reaction.    Past Surgical History:  Procedure Laterality Date   ABDOMINAL HYSTERECTOMY     CESAREAN SECTION     COLONOSCOPY N/A 03/03/2015  Procedure: COLONOSCOPY;  Surgeon: Midge Minium, MD;  Location: [facility];  Service: Gastroenterology;  Laterality: N/A;   POLYPECTOMY  03/03/2015   Procedure: POLYPECTOMY INTESTINAL;  Surgeon: Midge Minium, MD;  Location: [facility];  Service: Gastroenterology;;   TUBAL LIGATION      Social History   Tobacco Use   Smoking status: Former    Packs/day: 1.50    Years: 15.00    Additional pack years: 0.00    Total pack years: 22.50    Types: Cigarettes    Quit date: 2012    Years since quitting: 12.4   Smokeless  tobacco: Never  Vaping Use   Vaping Use: Never used  Substance Use Topics   Alcohol use: No    Alcohol/week: 0.0 standard drinks of alcohol   Drug use: No     Medication list has been reviewed and updated.  Current Meds  Medication Sig   albuterol (PROVENTIL HFA;VENTOLIN HFA) 108 (90 Base) MCG/ACT inhaler Inhale 2 puffs into the lungs every 6 (six) hours as needed for wheezing or shortness of breath.   amoxicillin-clavulanate (AUGMENTIN) 875-125 MG tablet Take 1 tablet by mouth 2 (two) times daily for 10 days.   Ascorbic Acid (VITAMIN C) 1000 MG tablet Take 1,000 mg by mouth every other day.    atorvastatin (LIPITOR) 10 MG tablet TAKE 1 TABLET BY MOUTH EVERY DAY   Cholecalciferol 1.25 MG (50000 UT) capsule Take 5,000 Units by mouth daily.   nystatin cream (MYCOSTATIN) SMARTSIG:sparingly Topical Twice Daily   Omega-3 Fatty Acids (FISH OIL) 1000 MG CAPS Take 1,000 mg by mouth daily.    TRELEGY ELLIPTA 100-62.5-25 MCG/INH AEPB Inhale 1 puff into the lungs daily.   triamcinolone cream (KENALOG) 0.1 % Apply topically 2 (two) times daily.   zinc gluconate 50 MG tablet Take 50 mg by mouth daily.       03/21/2022    8:54 AM 01/18/2022   10:42 AM 03/21/2021    8:47 AM 10/10/2020    9:47 AM  GAD 7 : Generalized Anxiety Score  Nervous, Anxious, on Edge 0 0 0 0  Control/stop worrying 0 0 0 0  Worry too much - different things 0 0 0 0  Trouble relaxing 0 0 0 0  Restless 0 0 0 0  Easily annoyed or irritable 0 0 0 0  Afraid - awful might happen 0 0 0 0  Total GAD 7 Score 0 0 0 0       03/24/2023    2:24 PM 03/21/2022    8:54 AM 01/18/2022   10:41 AM  Depression screen PHQ 2/9  Decreased Interest 0 0 0  Down, Depressed, Hopeless 0 0 0  PHQ - 2 Score 0 0 0  Altered sleeping 2 0 0  Tired, decreased energy 0 0 3  Change in appetite 0 0 2  Feeling bad or failure about yourself  0 0 0  Trouble concentrating 0 0 0  Moving slowly or fidgety/restless 0 0 0  Suicidal thoughts 0 0 0  PHQ-9  Score 2 0 5  Difficult doing work/chores Not difficult at all Not difficult at all Not difficult at all    BP Readings from Last 3 Encounters:  03/24/23 128/76  03/24/23 128/76  03/21/22 118/70    Physical Exam Vitals and nursing note reviewed.  Constitutional:      General: She is not in acute distress.    Appearance: Normal appearance. She is well-developed.  HENT:  Head: Normocephalic and atraumatic.  Cardiovascular:     Rate and Rhythm: Normal rate and regular rhythm.     Heart sounds: No murmur heard. Pulmonary:     Effort: Pulmonary effort is normal. No respiratory distress.     Breath sounds: Decreased breath sounds present. No wheezing.  Abdominal:     General: Abdomen is protuberant.     Palpations: Abdomen is soft. There is no hepatomegaly or splenomegaly.     Tenderness: There is abdominal tenderness in the suprapubic area. There is no right CVA tenderness, left CVA tenderness, guarding or rebound.  Musculoskeletal:     Cervical back: Normal range of motion.  Lymphadenopathy:     Cervical: No cervical adenopathy.  Skin:    General: Skin is warm and dry.     Findings: No rash.  Neurological:     Mental Status: She is alert and oriented to person, place, and time.  Psychiatric:        Mood and Affect: Mood normal.        Behavior: Behavior normal.     Wt Readings from Last 3 Encounters:  03/24/23 170 lb (77.1 kg)  03/24/23 170 lb (77.1 kg)  08/30/22 169 lb (76.7 kg)    BP 128/76   Pulse (!) 104   Temp 98.4 F (36.9 C) (Oral)   Ht 5\' 3"  (1.6 m)   Wt 170 lb (77.1 kg)   SpO2 96%   BMI 30.11 kg/m   Assessment and Plan:  Problem List Items Addressed This Visit     Stage 2 moderate COPD by GOLD classification (HCC) (Chronic)    On Trelegy and albuterol Followed by Pulmonary      Esophageal dysphagia    New complaint but intermittent for some time. She has symptoms of GERD but only takes TUMS Will refer to GI for consideration of EGD       Relevant Orders   Ambulatory referral to Gastroenterology   Aortic atherosclerosis (HCC) (Chronic)   Other Visit Diagnoses     Dysuria    -  Primary   Urine clear push fluids and follow up if needed   Relevant Orders   POCT urinalysis dipstick (Completed)   Diverticulitis of large intestine without perforation or abscess without bleeding       Relevant Medications   amoxicillin-clavulanate (AUGMENTIN) 875-125 MG tablet       No follow-ups on file.  MAW completed today by CMA. Partially dictated using Dragon software, any errors are not intentional.  Reubin Milan, MD [facility] [place], [place]

## 2023-03-24 NOTE — Assessment & Plan Note (Signed)
New complaint but intermittent for some time. She has symptoms of GERD but only takes TUMS Will refer to GI for consideration of EGD

## 2023-03-24 NOTE — Progress Notes (Addendum)
Subjective:   Eileen Mills is a 72 y.o. female who presents for Medicare Annual (Subsequent) preventive examination.  Visit Complete: In person  Review of Systems    Defer to PCP  Cardiac Risk Factors include: advanced age (>57men, >14 women);smoking/ tobacco exposure;obesity (BMI >30kg/m2)     Objective:    Today's Vitals   03/24/23 1404  BP: 128/76  Pulse: (!) 104  Temp: 98.4 F (36.9 C)  TempSrc: Oral  SpO2: 96%  Weight: 170 lb (77.1 kg)  Height: 5\' 3"  (1.6 m)  PainSc: 0-No pain   Body mass index is 30.11 kg/m.     03/24/2023    2:25 PM 08/17/2020   11:04 AM 08/17/2020    7:00 AM 07/26/2020    9:42 AM 07/19/2019   10:16 AM 11/08/2018    7:46 AM 10/28/2018   10:45 AM  Advanced Directives  Does Patient Have a Medical Advance Directive? Yes No No Yes Yes No No  Type of Advance Directive Living will   Healthcare Power of [place];Living will Healthcare Power of [place];Living will    Copy of Healthcare Power of Attorney in Chart?    No - copy requested No - copy requested    Would patient like information on creating a medical advance directive?  No - Patient declined No - Patient declined   No - Patient declined No - Patient declined    Current Medications (verified) Outpatient Encounter Medications as of 03/24/2023  Medication Sig   albuterol (PROVENTIL HFA;VENTOLIN HFA) 108 (90 Base) MCG/ACT inhaler Inhale 2 puffs into the lungs every 6 (six) hours as needed for wheezing or shortness of breath.   Ascorbic Acid (VITAMIN C) 1000 MG tablet Take 1,000 mg by mouth every other day.    atorvastatin (LIPITOR) 10 MG tablet TAKE 1 TABLET BY MOUTH EVERY DAY   Cholecalciferol 1.25 MG (50000 UT) capsule Take 5,000 Units by mouth daily.   nystatin cream (MYCOSTATIN) SMARTSIG:sparingly Topical Twice Daily   Omega-3 Fatty Acids (FISH OIL) 1000 MG CAPS Take 1,000 mg by mouth daily.    TRELEGY ELLIPTA 100-62.5-25 MCG/INH AEPB Inhale 1 puff into the lungs daily.    triamcinolone cream (KENALOG) 0.1 % Apply topically 2 (two) times daily.   zinc gluconate 50 MG tablet Take 50 mg by mouth daily.   No facility-administered encounter medications on file as of 03/24/2023.    Allergies (verified) Sulfa antibiotics   History: Past Medical History:  Diagnosis Date   Acute respiratory failure with hypoxia (HCC) 08/17/2020   Arthritis    Cataract    COPD (chronic obstructive pulmonary disease) (HCC)    Emphysema of lung (HCC)    Hyperlipidemia    Past Surgical History:  Procedure Laterality Date   ABDOMINAL HYSTERECTOMY     CESAREAN SECTION     COLONOSCOPY N/A 03/03/2015   Procedure: COLONOSCOPY;  Surgeon: Midge Minium, MD;  Location: [facility];  Service: Gastroenterology;  Laterality: N/A;   POLYPECTOMY  03/03/2015   Procedure: POLYPECTOMY INTESTINAL;  Surgeon: Midge Minium, MD;  Location: [facility];  Service: Gastroenterology;;   TUBAL LIGATION     Family History  Problem Relation Age of Onset   Alzheimer's disease Mother    CAD Father    Breast cancer Neg Hx    Social History   Socioeconomic History   Marital status: Widowed    Spouse name: Not on file   Number of children: 4   Years of education: Not on file  Highest education level: High school graduate  Occupational History   Not on file  Tobacco Use   Smoking status: Former    Packs/day: 1.50    Years: 15.00    Additional pack years: 0.00    Total pack years: 22.50    Types: Cigarettes    Quit date: 2012    Years since quitting: 12.4   Smokeless tobacco: Never  Vaping Use   Vaping Use: Never used  Substance and Sexual Activity   Alcohol use: No    Alcohol/week: 0.0 standard drinks of alcohol   Drug use: No   Sexual activity: Not Currently  Other Topics Concern   Not on file  Social History Narrative   Not on file   Social Determinants of Health   Financial Resource Strain: Low Risk  (03/24/2023)   Overall Financial Resource Strain (CARDIA)     Difficulty of Paying Living Expenses: Not hard at all  Food Insecurity: No Food Insecurity (03/24/2023)   Hunger Vital Sign    Worried About Running Out of Food in the Last Year: Never true    Ran Out of Food in the Last Year: Never true  Transportation Needs: No Transportation Needs (03/24/2023)   PRAPARE - Administrator, Civil Service (Medical): No    Lack of Transportation (Non-Medical): No  Physical Activity: Inactive (03/24/2023)   Exercise Vital Sign    Days of Exercise per Week: 0 days    Minutes of Exercise per Session: 0 min  Stress: No Stress Concern Present (03/24/2023)   [employer] of Occupational Health - Occupational Stress Questionnaire    Feeling of Stress : Not at all  Social Connections: Socially Isolated (03/24/2023)   Social Connection and Isolation Panel [NHANES]    Frequency of Communication with Friends and Family: More than three times a week    Frequency of Social Gatherings with Friends and Family: Three times a week    Attends Religious Services: Never    Active Member of Clubs or Organizations: No    Attends Banker Meetings: Never    Marital Status: Widowed    Tobacco Counseling - Counseling not needed.   Clinical Intake:  Pre-visit preparation completed: Yes  Pain : No/denies pain Pain Score: 0-No pain     BMI - recorded: 30.11 Nutritional Status: BMI > 30  Obese Nutritional Risks: None Diabetes: No  How often do you need to have someone help you when you read instructions, pamphlets, or other written materials from your doctor or pharmacy?: 1 - Never  Interpreter Needed?: No  Information entered by :: Margaretha Sheffield, CMA   Activities of Daily Living    03/24/2023    2:26 PM  In your present state of health, do you have any difficulty performing the following activities:  Hearing? 0  Vision? 0  Difficulty concentrating or making decisions? 0  Walking or climbing stairs? 1  Dressing or bathing? 0   Doing errands, shopping? 0  Preparing Food and eating ? N  Using the Toilet? N  In the past six months, have you accidently leaked urine? N  Do you have problems with loss of bowel control? N  Managing your Medications? N  Managing your Finances? N  Housekeeping or managing your Housekeeping? N    Patient Care Team: Reubin Milan, MD as PCP - General (Internal Medicine) [facility] (Dermatology) Mertie Moores, MD as Referring Physician (Specialist) Midge Minium, MD as Consulting Physician (Gastroenterology)  Indicate  any recent Medical Services you may have received from other than [facility] providers in the past year (date may be approximate).     Assessment:   This is a routine wellness examination for [place].  Hearing/Vision screen Hearing Screening - Comments:: No concerns at this time. Vision Screening - Comments:: No concerns at this time.  Dietary issues and exercise activities discussed:  Patient does not exercise at this current time. Will try to drink more water daily.   Goals Addressed             This Visit's Progress    DIET - INCREASE WATER INTAKE       Drink more water daily.      Depression Screen    03/24/2023    2:24 PM 03/21/2022    8:54 AM 01/18/2022   10:41 AM 03/21/2021    8:47 AM 10/10/2020    9:47 AM 08/28/2020    2:47 PM 07/26/2020    9:42 AM  PHQ 2/9 Scores  PHQ - 2 Score 0 0 0 0 0 0 0  PHQ- 9 Score 2 0 5 0 0 0     Fall Risk    03/24/2023    2:25 PM 03/21/2022    8:54 AM 01/18/2022   10:42 AM 03/21/2021    8:47 AM 10/10/2020    9:47 AM  Fall Risk   Falls in the past year? 0 0 0 0 0  Number falls in past yr: 0 0 0    Injury with Fall? 0 0 0    Risk for fall due to : No Fall Risks No Fall Risks No Fall Risks    Follow up Falls evaluation completed Falls evaluation completed Falls evaluation completed Falls evaluation completed Falls evaluation completed    MEDICARE RISK AT HOME:  Medicare Risk at Home - 03/24/23 1454      Any stairs in or around the home? Yes    If so, are there any without handrails? No    Home free of loose throw rugs in walkways, pet beds, electrical cords, etc? No    Adequate lighting in your home to reduce risk of falls? No    Life alert? No    Use of a cane, walker or w/c? No    Grab bars in the bathroom? No    Shower chair or bench in shower? No    Elevated toilet seat or a handicapped toilet? No             TIMED UP AND GO:  Was the test performed?  Yes  Length of time to ambulate 10 feet: 8 sec Gait steady and fast without use of assistive device    Cognitive Function:     03/24/2023    2:26 PM 07/26/2020    9:44 AM 07/19/2019   10:20 AM  6CIT Screen  What Year? 0 points 0 points 0 points  What month? 0 points 0 points 0 points  What time? 0 points 0 points 0 points  Count back from 20 0 points 0 points 0 points  Months in reverse 0 points 0 points 0 points  Repeat phrase 0 points 0 points 0 points  Total Score 0 points 0 points 0 points    Immunizations Immunization History  Administered Date(s) Administered   Influenza Inj Mdck Quad Pf 06/23/2018   Influenza, High Dose Seasonal PF 07/17/2019, 07/16/2020   Influenza,inj,Quad PF,6+ Mos 07/07/2015, 07/10/2017   Influenza-Unspecified 06/30/2016, 07/17/2019, 06/21/2021, 07/10/2022  PFIZER(Purple Top)SARS-COV-2 Vaccination 04/14/2020, 05/15/2020   Pneumococcal Conjugate-13 01/18/2019   Pneumococcal Polysaccharide-23 01/07/2017, 03/21/2021   Tdap 01/05/2016   Zoster, Live 12/22/2013   Zoster, Unspecified 05/27/2022    TDAP status: Up to date  Flu Vaccine status: Up to date  Pneumococcal vaccine status: Up to date  Covid-19 vaccine status: Completed vaccines  Qualifies for Shingles Vaccine? Yes   Zostavax completed No   Shingrix Completed?: No.    Education has been provided regarding the importance of this vaccine. Patient has been advised to call insurance company to determine out of pocket expense  if they have not yet received this vaccine. Advised may also receive vaccine at local pharmacy or Health Dept. Verbalized acceptance and understanding.  Screening Tests Health Maintenance  Topic Date Due   Zoster Vaccines- Shingrix (1 of 2) 07/08/2003   INFLUENZA VACCINE  05/01/2023   MAMMOGRAM  07/02/2023   Lung Cancer Screening  08/31/2023   Medicare Annual Wellness (AWV)  03/23/2024   Colonoscopy  03/02/2025   DTaP/Tdap/Td (2 - Td or Tdap) 01/04/2026   Pneumonia Vaccine 76+ Years old  Completed   DEXA SCAN  Completed   Hepatitis C Screening  Completed   HPV VACCINES  Aged Out   COVID-19 Vaccine  Discontinued    Health Maintenance  Health Maintenance Due  Topic Date Due   Zoster Vaccines- Shingrix (1 of 2) 07/08/2003    Colorectal cancer screening: Type of screening: Colonoscopy. Completed 03/03/2015. Repeat every 10 years  Mammogram status: Completed 07/01/2022. Repeat every year  Bone Density status: Completed 09/04/2020. Results reflect: Bone density results: OSTEOPOROSIS. Repeat every 2-3 years.  Lung Cancer Screening: (Low Dose CT Chest recommended if Age 46-80 years, 20 pack-year currently smoking OR have quit w/in 15years.) does not qualify.   Additional Screening:  Hepatitis C Screening: does qualify; Completed 01/05/2016  Vision Screening: Recommended annual ophthalmology exams for early detection of glaucoma and other disorders of the eye. Is the patient up to date with their annual eye exam?  Yes  Who is the provider or what is the name of the office in which the patient attends annual eye exams? [facility] in [place]  Dental Screening: Recommended annual dental exams for proper oral hygiene  Community Resource Referral / Chronic Care Management: CRR required this visit?  No   CCM required this visit?  No     Plan:     I have personally reviewed and noted the following in the patient's chart:   Medical and social history Use of alcohol,  tobacco or illicit drugs  Current medications and supplements including opioid prescriptions. Patient is not currently taking opioid prescriptions. Functional ability and status Nutritional status Physical activity Advanced directives List of other physicians Hospitalizations, surgeries, and ER visits in previous 12 months Vitals Screenings to include cognitive, depression, and falls Referrals and appointments  In addition, I have reviewed and discussed with patient certain preventive protocols, quality metrics, and best practice recommendations. A written personalized care plan for preventive services as well as general preventive health recommendations were provided to patient.     Mariel Sleet, CMA   03/24/2023   After Visit Summary: Printed and Given to patient by front desk.  Nurse Notes: No concerns at this time.

## 2023-03-25 ENCOUNTER — Encounter: Payer: PPO | Admitting: Internal Medicine

## 2023-03-26 ENCOUNTER — Telehealth: Payer: Self-pay | Admitting: Physician Assistant

## 2023-03-26 NOTE — Telephone Encounter (Signed)
Patient called in to schedule her office consult with Mrs. Eileen Mills. Patient also stated that her PCP advised her that she need an colonoscopy done. I advised her that she will have to talk to Inetta Fermo about her colonoscopy.

## 2023-04-17 DIAGNOSIS — M545 Low back pain, unspecified: Secondary | ICD-10-CM | POA: Diagnosis not present

## 2023-04-18 ENCOUNTER — Ambulatory Visit: Payer: Self-pay

## 2023-04-18 NOTE — Telephone Encounter (Signed)
Chief Complaint: Urinary symptoms Symptoms: Burning with urination and lower back pain Frequency: Onset 04/07/23 and gradually increased discomfort  Pertinent Negatives: Patient denies itching and other vaginal symptoms Disposition: [] ED /[] Urgent Care (no appt availability in office) / [x] Appointment(In office/virtual)/ []  [place] [facility] [facility]/ [] Home Care/ [] Refused Recommended Disposition /[] [facility]/ []  Follow-up with PCP Additional Notes: Patient states she is having burning with urination that started after completing a course of antibiotics. Patient also states she began to have lower back pain shortly after the burning started. She was seen for her back pain and was told it could be a pulled muscle. Patient states the burning is very uncomfortable 8/10. Advised patient that she will need to be evaluated. Patient requested to see her provider. Patient has been scheduled for first available with provider on 04/21/23. Advised patient if symptoms get worse to got to the UC or ED. Patient verbalized understanding.  Reason for Disposition  Side (flank) or lower back pain present  Answer Assessment - Initial Assessment Questions 1. SYMPTOM: "What's the main symptom you're concerned about?" (e.g., frequency, incontinence)     Burning with urination  2. ONSET: "When did the  burning  start?"     April 07, 2023 3. PAIN: "Is there any pain?" If Yes, ask: "How bad is it?" (Scale: 1-10; mild, moderate, severe)     Yes, 8/10 4. CAUSE: "What do you think is causing the symptoms?"     I was taking antibiotics before the symptoms started 5. OTHER SYMPTOMS: "Do you have any other symptoms?" (e.g., blood in urine, fever, flank pain, pain with urination)     I had pink tinged discharge yesterday  Protocols used: Urinary Symptoms-A-AH

## 2023-04-21 ENCOUNTER — Encounter: Payer: Self-pay | Admitting: Internal Medicine

## 2023-04-21 ENCOUNTER — Ambulatory Visit (INDEPENDENT_AMBULATORY_CARE_PROVIDER_SITE_OTHER): Payer: PPO | Admitting: Internal Medicine

## 2023-04-21 VITALS — BP 118/70 | HR 86 | Temp 98.1°F | Ht 63.0 in | Wt 170.0 lb

## 2023-04-21 DIAGNOSIS — J441 Chronic obstructive pulmonary disease with (acute) exacerbation: Secondary | ICD-10-CM | POA: Diagnosis not present

## 2023-04-21 DIAGNOSIS — N760 Acute vaginitis: Secondary | ICD-10-CM | POA: Diagnosis not present

## 2023-04-21 DIAGNOSIS — R300 Dysuria: Secondary | ICD-10-CM

## 2023-04-21 LAB — POCT URINALYSIS DIPSTICK
Bilirubin, UA: NEGATIVE
Blood, UA: NEGATIVE
Glucose, UA: NEGATIVE
Ketones, UA: NEGATIVE
Leukocytes, UA: NEGATIVE
Nitrite, UA: NEGATIVE
Protein, UA: NEGATIVE
Spec Grav, UA: 1.015 (ref 1.010–1.025)
Urobilinogen, UA: 0.2 E.U./dL
pH, UA: 6 (ref 5.0–8.0)

## 2023-04-21 MED ORDER — FLUCONAZOLE 100 MG PO TABS
100.0000 mg | ORAL_TABLET | Freq: Once | ORAL | 0 refills | Status: AC
Start: 2023-04-21 — End: 2023-04-21

## 2023-04-21 MED ORDER — AZITHROMYCIN 250 MG PO TABS
ORAL_TABLET | ORAL | 0 refills | Status: AC
Start: 2023-04-21 — End: 2023-04-26

## 2023-04-21 NOTE — Progress Notes (Signed)
Date:  04/21/2023   Name:  Eileen Mills   DOB:  Sep 22, 1951   MRN:  [id number]   Chief Complaint: Dysuria and Cough  Dysuria  This is a new problem. Episode onset: X2 weeks. The problem has been gradually improving. The quality of the pain is described as burning. The pain is at a severity of 5/10. The pain is mild. There has been no fever. She is Not sexually active. There is No history of pyelonephritis. Pertinent negatives include no discharge or hematuria. Treatments tried: vagisal, vaseline. The treatment provided mild relief. recent antibiotics  Cough This is a new problem. Episode onset: X4 days. The problem has been unchanged. Episode frequency: come and goes. The cough is Productive of sputum (greenish- yellow). Associated symptoms include nasal congestion. Pertinent negatives include no chest pain, fever, postnasal drip, sore throat or wheezing. Nothing aggravates the symptoms. She has tried nothing for the symptoms. recent antibiotics    Lab Results  Component Value Date   NA 141 03/21/2022   K 4.9 03/21/2022   CO2 24 03/21/2022   GLUCOSE 94 03/21/2022   BUN 10 03/21/2022   CREATININE 0.96 03/21/2022   CALCIUM 10.2 03/21/2022   EGFR 64 03/21/2022   GFRNONAA >60 08/18/2020   Lab Results  Component Value Date   CHOL 204 (H) 03/21/2022   HDL 93 03/21/2022   LDLCALC 92 03/21/2022   TRIG 113 03/21/2022   CHOLHDL 2.2 03/21/2022   Lab Results  Component Value Date   TSH 2.850 03/21/2022   No results found for: "HGBA1C" Lab Results  Component Value Date   WBC 7.1 03/21/2022   HGB 13.0 03/21/2022   HCT 39.1 03/21/2022   MCV 90 03/21/2022   PLT 284 03/21/2022   Lab Results  Component Value Date   ALT 44 (H) 03/21/2022   AST 31 03/21/2022   ALKPHOS 99 03/21/2022   BILITOT 0.3 03/21/2022   Lab Results  Component Value Date   VD25OH 29.7 (L) 03/21/2022     Review of Systems  Constitutional:  Negative for fatigue and fever.  HENT:  Negative for  postnasal drip and sore throat.   Respiratory:  Positive for cough and chest tightness. Negative for wheezing.   Cardiovascular:  Negative for chest pain.  Genitourinary:  Positive for dysuria. Negative for hematuria and vaginal discharge (but vaginal irritation).    Patient Active Problem List   Diagnosis Date Noted   Esophageal dysphagia 03/24/2023   Age-related osteoporosis without current pathological fracture 01/21/2021   Aortic atherosclerosis (HCC) 03/30/2020   Hypercalcemia 07/10/2017   Stage 2 moderate COPD by GOLD classification (HCC) 01/07/2017   Ganglion cyst of wrist 07/02/2015   Intermittent palpitations 07/02/2015   Degenerative arthritis of spine with cord compression 07/02/2015   Abdominal bloating 07/02/2015   Baker's cyst of knee 07/02/2015   Hyperlipidemia, mild 07/02/2015    Allergies  Allergen Reactions   Sulfa Antibiotics Other (See Comments)    It was a long time ago, cannot remember reaction.    Past Surgical History:  Procedure Laterality Date   ABDOMINAL HYSTERECTOMY     CESAREAN SECTION     COLONOSCOPY N/A 03/03/2015   Procedure: COLONOSCOPY;  Surgeon: Midge Minium, MD;  Location: [facility];  Service: Gastroenterology;  Laterality: N/A;   POLYPECTOMY  03/03/2015   Procedure: POLYPECTOMY INTESTINAL;  Surgeon: Midge Minium, MD;  Location: [facility];  Service: Gastroenterology;;   TUBAL LIGATION      Social History  Tobacco Use   Smoking status: Former    Current packs/day: 0.00    Average packs/day: 1.5 packs/day for 15.0 years (22.5 ttl pk-yrs)    Types: Cigarettes    Start date: 12    Quit date: 2012    Years since quitting: 12.5   Smokeless tobacco: Never  Vaping Use   Vaping status: Never Used  Substance Use Topics   Alcohol use: No    Alcohol/week: 0.0 standard drinks of alcohol   Drug use: No     Medication list has been reviewed and updated.  Current Meds  Medication Sig   albuterol (PROVENTIL HFA;VENTOLIN  HFA) 108 (90 Base) MCG/ACT inhaler Inhale 2 puffs into the lungs every 6 (six) hours as needed for wheezing or shortness of breath.   Ascorbic Acid (VITAMIN C) 1000 MG tablet Take 1,000 mg by mouth every other day.    atorvastatin (LIPITOR) 10 MG tablet TAKE 1 TABLET BY MOUTH EVERY DAY   azithromycin (ZITHROMAX Z-PAK) 250 MG tablet UAD   Cholecalciferol 1.25 MG (50000 UT) capsule Take 5,000 Units by mouth daily.   fluconazole (DIFLUCAN) 100 MG tablet Take 1 tablet (100 mg total) by mouth once for 1 dose.   meloxicam (MOBIC) 7.5 MG tablet Take 7.5 mg by mouth daily.   nystatin cream (MYCOSTATIN) SMARTSIG:sparingly Topical Twice Daily   tizanidine (ZANAFLEX) 2 MG capsule Take 2 mg by mouth 3 (three) times daily as needed for muscle spasms.   TRELEGY ELLIPTA 100-62.5-25 MCG/INH AEPB Inhale 1 puff into the lungs daily.   triamcinolone cream (KENALOG) 0.1 % Apply topically 2 (two) times daily.   zinc gluconate 50 MG tablet Take 50 mg by mouth daily.   [DISCONTINUED] Omega-3 Fatty Acids (FISH OIL) 1000 MG CAPS Take 1,000 mg by mouth daily.        04/21/2023    1:18 PM 03/21/2022    8:54 AM 01/18/2022   10:42 AM 03/21/2021    8:47 AM  GAD 7 : Generalized Anxiety Score  Nervous, Anxious, on Edge 0 0 0 0  Control/stop worrying 0 0 0 0  Worry too much - different things 0 0 0 0  Trouble relaxing 0 0 0 0  Restless 0 0 0 0  Easily annoyed or irritable 0 0 0 0  Afraid - awful might happen 0 0 0 0  Total GAD 7 Score 0 0 0 0  Anxiety Difficulty Not difficult at all          04/21/2023    1:18 PM 03/24/2023    2:24 PM 03/21/2022    8:54 AM  Depression screen PHQ 2/9  Decreased Interest 0 0 0  Down, Depressed, Hopeless 0 0 0  PHQ - 2 Score 0 0 0  Altered sleeping 0 2 0  Tired, decreased energy 0 0 0  Change in appetite 0 0 0  Feeling bad or failure about yourself  0 0 0  Trouble concentrating 0 0 0  Moving slowly or fidgety/restless 0 0 0  Suicidal thoughts 0 0 0  PHQ-9 Score 0 2 0   Difficult doing work/chores Not difficult at all Not difficult at all Not difficult at all    BP Readings from Last 3 Encounters:  04/21/23 118/70  03/24/23 128/76  03/24/23 128/76    Physical Exam Vitals and nursing note reviewed.  Constitutional:      General: She is not in acute distress.    Appearance: Normal appearance. She is well-developed.  HENT:  Head: Normocephalic and atraumatic.  Cardiovascular:     Rate and Rhythm: Normal rate and regular rhythm.  Pulmonary:     Effort: Pulmonary effort is normal. No respiratory distress.     Breath sounds: No wheezing or rhonchi.  Musculoskeletal:     Cervical back: Normal range of motion.     Right lower leg: No edema.     Left lower leg: No edema.  Skin:    General: Skin is warm and dry.     Findings: No rash.  Neurological:     Mental Status: She is alert and oriented to person, place, and time.  Psychiatric:        Mood and Affect: Mood normal.        Behavior: Behavior normal.     Wt Readings from Last 3 Encounters:  04/21/23 170 lb (77.1 kg)  03/24/23 170 lb (77.1 kg)  03/24/23 170 lb (77.1 kg)    BP 118/70   Pulse 86   Temp 98.1 F (36.7 C) (Oral)   Ht 5\' 3"  (1.6 m)   Wt 170 lb (77.1 kg)   SpO2 98%   BMI 30.11 kg/m   Assessment and Plan:  Problem List Items Addressed This Visit   None Visit Diagnoses     Acute exacerbation of chronic obstructive pulmonary disease (COPD) (HCC)    -  Primary   Take Mucinex and Delsym Zpak Trelegy.  call for steroid taper if needed   Relevant Medications   azithromycin (ZITHROMAX Z-PAK) 250 MG tablet   Acute vaginitis       s/p antibiotic therapy will treat with single dose Diflucan   Relevant Medications   fluconazole (DIFLUCAN) 100 MG tablet   Dysuria       UA clear   Relevant Orders   POCT urinalysis dipstick (Completed)       No follow-ups on file.    Reubin Milan, MD [facility] [place]

## 2023-05-01 ENCOUNTER — Encounter: Payer: Self-pay | Admitting: Physician Assistant

## 2023-05-01 ENCOUNTER — Ambulatory Visit: Payer: PPO | Admitting: Physician Assistant

## 2023-05-01 VITALS — BP 136/82 | HR 73 | Temp 98.5°F | Ht 63.0 in | Wt 168.6 lb

## 2023-05-01 DIAGNOSIS — J439 Emphysema, unspecified: Secondary | ICD-10-CM | POA: Diagnosis not present

## 2023-05-01 DIAGNOSIS — K219 Gastro-esophageal reflux disease without esophagitis: Secondary | ICD-10-CM

## 2023-05-01 DIAGNOSIS — R1310 Dysphagia, unspecified: Secondary | ICD-10-CM | POA: Diagnosis not present

## 2023-05-01 NOTE — Progress Notes (Signed)
Celso Amy, PA-C [address]  [address]  [place], [place] [address]  Main: [phone]  Fax: [phone]   Gastroenterology Consultation  Referring Provider:     Reubin Milan, MD Primary Care Physician:  Reubin Milan, MD Primary Gastroenterologist:  Celso Amy, PA-C / Dr. Midge Minium   Reason for Consultation:     Dysphagia        HPI:   Eileen Mills is a 72 y.o. y/o female referred for consultation & management  by Reubin Milan, MD.    Patient has had intermittent episodes of solid food dysphagia for 1 year.  Has had 1 episode once a month.  It is sporadic.  She feels like food gets stuck in her mid lower chest.  She has had to vomit food back up.  Denies dysphagia to liquids.  She is taking a lot of Tums for acid reflux.  Not on a PPI or H2 RB.  No recent barium swallow test.  No previous EGD.  Colonoscopy 03/2015 -result unavailable.  She was told to repeat colonoscopy in 10 years.  Has history of COPD, followed by pulmonologist.  Doing well on current inhalers.  She is not on oxygen.  She is not having any shortness of breath with exertion.  Past Medical History:  Diagnosis Date   Acute respiratory failure with hypoxia (HCC) 08/17/2020   Arthritis    Cataract    COPD (chronic obstructive pulmonary disease) (HCC)    Emphysema of lung (HCC)    Hyperlipidemia     Past Surgical History:  Procedure Laterality Date   ABDOMINAL HYSTERECTOMY     CESAREAN SECTION     COLONOSCOPY N/A 03/03/2015   Procedure: COLONOSCOPY;  Surgeon: Midge Minium, MD;  Location: [facility];  Service: Gastroenterology;  Laterality: N/A;   POLYPECTOMY  03/03/2015   Procedure: POLYPECTOMY INTESTINAL;  Surgeon: Midge Minium, MD;  Location: [facility];  Service: Gastroenterology;;   TUBAL LIGATION      Prior to Admission medications   Medication Sig Start Date End Date Taking? Authorizing Provider  albuterol (PROVENTIL HFA;VENTOLIN HFA) 108 (90  Base) MCG/ACT inhaler Inhale 2 puffs into the lungs every 6 (six) hours as needed for wheezing or shortness of breath. 01/17/16   Triplett, Cari B, FNP  Ascorbic Acid (VITAMIN C) 1000 MG tablet Take 1,000 mg by mouth every other day.     [provider]  atorvastatin (LIPITOR) 10 MG tablet TAKE 1 TABLET BY MOUTH EVERY DAY 07/24/22   Reubin Milan, MD  Cholecalciferol 1.25 MG (50000 UT) capsule Take 5,000 Units by mouth daily.    [provider]  meloxicam (MOBIC) 7.5 MG tablet Take 7.5 mg by mouth daily. 04/17/23   [provider]  nystatin cream (MYCOSTATIN) SMARTSIG:sparingly Topical Twice Daily 12/24/21   [provider]  tizanidine (ZANAFLEX) 2 MG capsule Take 2 mg by mouth 3 (three) times daily as needed for muscle spasms.    [provider]  TRELEGY ELLIPTA 100-62.5-25 MCG/INH AEPB Inhale 1 puff into the lungs daily. 03/07/21   [provider]  triamcinolone cream (KENALOG) 0.1 % Apply topically 2 (two) times daily. 12/24/21   [provider]  zinc gluconate 50 MG tablet Take 50 mg by mouth daily.    [provider]    Family History  Problem Relation Age of Onset   Alzheimer's disease Mother    CAD Father    Breast cancer Neg Hx  Social History   Tobacco Use   Smoking status: Former    Current packs/day: 0.00    Average packs/day: 1.5 packs/day for 15.0 years (22.5 ttl pk-yrs)    Types: Cigarettes    Start date: 72    Quit date: 2012    Years since quitting: 12.5   Smokeless tobacco: Never  Vaping Use   Vaping status: Never Used  Substance Use Topics   Alcohol use: No    Alcohol/week: 0.0 standard drinks of alcohol   Drug use: No    Allergies as of 05/01/2023 - Review Complete 05/01/2023  Allergen Reaction Noted   Sulfa antibiotics Other (See Comments) 10/13/2015    Review of Systems:    All systems reviewed and negative except where noted in HPI.   Physical Exam:  BP 136/82   Pulse 73    Temp 98.5 F (36.9 C)   Ht 5\' 3"  (1.6 m)   Wt 168 lb 9.6 oz (76.5 kg)   BMI 29.87 kg/m  No LMP recorded. Patient has had a hysterectomy. Psych:  Alert and cooperative. Normal mood and affect. General:   Alert,  Well-developed, well-nourished, pleasant and cooperative in NAD Head:  Normocephalic and atraumatic. Eyes:  Sclera clear, no icterus.   Conjunctiva pink. Lungs:  Respirations even and unlabored.  Clear throughout to auscultation.   No wheezes, crackles, or rhonchi. No acute distress. Heart:  Regular rate and rhythm; no murmurs, clicks, rubs, or gallops. Abdomen:  Normal bowel sounds.  No bruits.  Soft, and non-distended without masses, hepatosplenomegaly or hernias noted.  No Tenderness.  No guarding or rebound tenderness.    Neurologic:  Alert and oriented x3;  grossly normal neurologically. Psych:  Alert and cooperative. Normal mood and affect.  Imaging Studies: No results found.  Assessment and Plan:   Eileen Mills is a 72 y.o. y/o female has been referred for   1.  Dysphagia  Barium swallow with tablet  Scheduling EGD with DIL I discussed risks of EGD with patient to include risk of bleeding, perforation, and risk of sedation.  Patient expressed understanding and agrees to proceed with EGD.   2.  GERD  Recommend OTC Pepcid 20 Mg twice daily  If Pepcid does not help, then take OTC Prilosec 20 mg once daily  Recommend Lifestyle Modifications to prevent Acid Reflux.  Rec. Avoid coffee, sodas, peppermint, citrus fruits, and spicey foods.  Avoid eating 2-3 hours before bedtime.   3.  COPD/emphysema - Appears controlled on current inhalers and stable to undergo EGD  Follow up based on EGD results and GI symptoms.  Celso Amy, PA-C

## 2023-05-01 NOTE — Patient Instructions (Signed)
Barium swallow scheduled [facility] 05/05/23 arrive at 10:00 am.

## 2023-05-02 ENCOUNTER — Encounter: Payer: Self-pay | Admitting: Physician Assistant

## 2023-05-05 ENCOUNTER — Ambulatory Visit
Admission: RE | Admit: 2023-05-05 | Discharge: 2023-05-05 | Disposition: A | Discharge status: Home / Self Care | Payer: PPO | Source: Ambulatory Visit | Attending: Physician Assistant | Admitting: Physician Assistant

## 2023-05-05 DIAGNOSIS — R1310 Dysphagia, unspecified: Secondary | ICD-10-CM | POA: Insufficient documentation

## 2023-05-05 DIAGNOSIS — K449 Diaphragmatic hernia without obstruction or gangrene: Secondary | ICD-10-CM | POA: Diagnosis not present

## 2023-05-05 DIAGNOSIS — K219 Gastro-esophageal reflux disease without esophagitis: Secondary | ICD-10-CM | POA: Diagnosis not present

## 2023-05-12 ENCOUNTER — Ambulatory Visit (INDEPENDENT_AMBULATORY_CARE_PROVIDER_SITE_OTHER): Payer: PPO | Admitting: Internal Medicine

## 2023-05-12 ENCOUNTER — Encounter: Payer: Self-pay | Admitting: Internal Medicine

## 2023-05-12 VITALS — BP 128/72 | HR 65 | Ht 63.0 in | Wt 167.0 lb

## 2023-05-12 DIAGNOSIS — Z0000 Encounter for general adult medical examination without abnormal findings: Secondary | ICD-10-CM | POA: Diagnosis not present

## 2023-05-12 DIAGNOSIS — M810 Age-related osteoporosis without current pathological fracture: Secondary | ICD-10-CM

## 2023-05-12 DIAGNOSIS — E785 Hyperlipidemia, unspecified: Secondary | ICD-10-CM | POA: Diagnosis not present

## 2023-05-12 DIAGNOSIS — R1319 Other dysphagia: Secondary | ICD-10-CM | POA: Diagnosis not present

## 2023-05-12 DIAGNOSIS — Z1231 Encounter for screening mammogram for malignant neoplasm of breast: Secondary | ICD-10-CM | POA: Diagnosis not present

## 2023-05-12 DIAGNOSIS — J449 Chronic obstructive pulmonary disease, unspecified: Secondary | ICD-10-CM | POA: Diagnosis not present

## 2023-05-12 DIAGNOSIS — H10021 Other mucopurulent conjunctivitis, right eye: Secondary | ICD-10-CM | POA: Diagnosis not present

## 2023-05-12 DIAGNOSIS — Z23 Encounter for immunization: Secondary | ICD-10-CM | POA: Diagnosis not present

## 2023-05-12 MED ORDER — FAMOTIDINE 20 MG PO TABS: 20.0000 mg | ORAL_TABLET | Freq: Two times a day (BID) | ORAL | 0 refills | Status: DC

## 2023-05-12 MED ORDER — NEOMYCIN-POLYMYXIN-DEXAMETH 3.5-10000-0.1 OP SUSP: 2.0000 [drp] | Freq: Four times a day (QID) | OPHTHALMIC | 0 refills | Status: DC

## 2023-05-12 NOTE — Assessment & Plan Note (Signed)
Symptoms fairly well controlled on Trelegy and PRN albuterol.

## 2023-05-12 NOTE — Assessment & Plan Note (Signed)
Last DEXA 2021. On Calcium and vitamin D

## 2023-05-12 NOTE — Progress Notes (Signed)
Date:  05/12/2023   Name:  Eileen Mills   DOB:  [birth year]   MRN:  [id number]   Chief Complaint: Annual Exam Eileen Mills is a 72 y.o. female who presents today for her Complete Annual Exam. She feels well. She reports exercising. She reports she is sleeping well. Breast complaints - none.  Mammogram: 06/2022 DEXA: 08/2020 osteoporosis of hip Colonoscopy: 03/2015 repeat 10 yrs  Health Maintenance Due  Topic Date Due   Zoster Vaccines- Shingrix (1 of 2) 07/08/2003   INFLUENZA VACCINE  05/01/2023    Immunization History  Administered Date(s) Administered   Influenza Inj Mdck Quad Pf 06/23/2018   Influenza, High Dose Seasonal PF 07/17/2019, 07/16/2020   Influenza,inj,Quad PF,6+ Mos 07/07/2015, 07/10/2017   Influenza-Unspecified 06/30/2016, 07/17/2019, 06/21/2021, 07/10/2022   PFIZER(Purple Top)SARS-COV-2 Vaccination 04/14/2020, 05/15/2020   PNEUMOCOCCAL CONJUGATE-20 05/12/2023   Pneumococcal Conjugate-13 01/18/2019   Pneumococcal Polysaccharide-23 01/07/2017, 03/21/2021   Tdap 01/05/2016   Zoster, Live 12/22/2013   Zoster, Unspecified 05/27/2022    Hyperlipidemia This is a chronic problem. The problem is controlled. Pertinent negatives include no chest pain or shortness of breath. Current antihyperlipidemic treatment includes statins.  Conjunctivitis  The current episode started 2 days ago. The onset was sudden. The problem has been unchanged. Associated symptoms include eye itching, eye discharge and eye redness. Pertinent negatives include no fever, no abdominal pain, no constipation, no diarrhea, no vomiting, no congestion, no headaches, no hearing loss, no cough, no wheezing and no rash.  COPD - stable symptoms; well controlled with Trelegy.  Followed by Pulmonary. She remains tobacco free and is participating in lung cancer screening program. Dysphagia - recent Ba swallow shows a stricture and gastritis.  GI started her on pepcid or omeprazole which she  has not started yet.  EGD planned for next month.  Lab Results  Component Value Date   NA 141 03/21/2022   K 4.9 03/21/2022   CO2 24 03/21/2022   GLUCOSE 94 03/21/2022   BUN 10 03/21/2022   CREATININE 0.96 03/21/2022   CALCIUM 10.2 03/21/2022   EGFR 64 03/21/2022   GFRNONAA >60 08/18/2020   Lab Results  Component Value Date   CHOL 204 (H) 03/21/2022   HDL 93 03/21/2022   LDLCALC 92 03/21/2022   TRIG 113 03/21/2022   CHOLHDL 2.2 03/21/2022   Lab Results  Component Value Date   TSH 2.850 03/21/2022   No results found for: "HGBA1C" Lab Results  Component Value Date   WBC 7.1 03/21/2022   HGB 13.0 03/21/2022   HCT 39.1 03/21/2022   MCV 90 03/21/2022   PLT 284 03/21/2022   Lab Results  Component Value Date   ALT 44 (H) 03/21/2022   AST 31 03/21/2022   ALKPHOS 99 03/21/2022   BILITOT 0.3 03/21/2022   Lab Results  Component Value Date   VD25OH 29.7 (L) 03/21/2022     Review of Systems  Constitutional:  Negative for chills, fatigue and fever.  HENT:  Negative for congestion, hearing loss, tinnitus, trouble swallowing and voice change.   Eyes:  Positive for discharge, redness and itching. Negative for visual disturbance.  Respiratory:  Negative for cough, chest tightness, shortness of breath and wheezing.   Cardiovascular:  Negative for chest pain, palpitations and leg swelling.  Gastrointestinal:  Negative for abdominal pain, constipation, diarrhea and vomiting.  Endocrine: Negative for polydipsia and polyuria.  Genitourinary:  Negative for dysuria, frequency, genital sores, vaginal bleeding and vaginal discharge.  Musculoskeletal:  Negative for  arthralgias, gait problem and joint swelling.  Skin:  Negative for color change and rash.  Neurological:  Negative for dizziness, tremors, light-headedness and headaches.  Hematological:  Negative for adenopathy. Does not bruise/bleed easily.  Psychiatric/Behavioral:  Negative for dysphoric mood and sleep disturbance. The  patient is not nervous/anxious.     Patient Active Problem List   Diagnosis Date Noted   Esophageal dysphagia 03/24/2023   Age-related osteoporosis without current pathological fracture 01/21/2021   Aortic atherosclerosis (HCC) 03/30/2020   Hypercalcemia 07/10/2017   Stage 2 moderate COPD by GOLD classification (HCC) 01/07/2017   Ganglion cyst of wrist 07/02/2015   Intermittent palpitations 07/02/2015   Degenerative arthritis of spine with cord compression 07/02/2015   Abdominal bloating 07/02/2015   Baker's cyst of knee 07/02/2015   Hyperlipidemia, mild 07/02/2015    Allergies  Allergen Reactions   Sulfa Antibiotics Other (See Comments)    It was a long time ago, cannot remember reaction.    Past Surgical History:  Procedure Laterality Date   ABDOMINAL HYSTERECTOMY     CESAREAN SECTION     COLONOSCOPY N/A 03/03/2015   Procedure: COLONOSCOPY;  Surgeon: Midge Minium, MD;  Location: [facility];  Service: Gastroenterology;  Laterality: N/A;   POLYPECTOMY  03/03/2015   Procedure: POLYPECTOMY INTESTINAL;  Surgeon: Midge Minium, MD;  Location: [facility];  Service: Gastroenterology;;   TUBAL LIGATION      Social History   Tobacco Use   Smoking status: Former    Current packs/day: 0.00    Average packs/day: 1.5 packs/day for 15.0 years (22.5 ttl pk-yrs)    Types: Cigarettes    Start date: 53    Quit date: 2012    Years since quitting: 12.6   Smokeless tobacco: Never  Vaping Use   Vaping status: Never Used  Substance Use Topics   Alcohol use: No    Alcohol/week: 0.0 standard drinks of alcohol   Drug use: No     Medication list has been reviewed and updated.  Current Meds  Medication Sig   albuterol (PROVENTIL HFA;VENTOLIN HFA) 108 (90 Base) MCG/ACT inhaler Inhale 2 puffs into the lungs every 6 (six) hours as needed for wheezing or shortness of breath.   Ascorbic Acid (VITAMIN C) 1000 MG tablet Take 1,000 mg by mouth every other day.    atorvastatin  (LIPITOR) 10 MG tablet TAKE 1 TABLET BY MOUTH EVERY DAY   Cholecalciferol 1.25 MG (50000 UT) capsule Take 5,000 Units by mouth daily.   famotidine (PEPCID) 20 MG tablet Take 1 tablet (20 mg total) by mouth 2 (two) times daily.   neomycin-polymyxin b-dexamethasone (MAXITROL) 3.5-10000-0.1 SUSP Place 2 drops into both eyes every 6 (six) hours.   TRELEGY ELLIPTA 100-62.5-25 MCG/INH AEPB Inhale 1 puff into the lungs daily.   triamcinolone cream (KENALOG) 0.1 % Apply topically 2 (two) times daily.   zinc gluconate 50 MG tablet Take 50 mg by mouth daily.       05/12/2023    9:54 AM 04/21/2023    1:18 PM 03/21/2022    8:54 AM 01/18/2022   10:42 AM  GAD 7 : Generalized Anxiety Score  Nervous, Anxious, on Edge 0 0 0 0  Control/stop worrying 0 0 0 0  Worry too much - different things 0 0 0 0  Trouble relaxing 0 0 0 0  Restless 0 0 0 0  Easily annoyed or irritable 0 0 0 0  Afraid - awful might happen 0 0 0 0  Total  GAD 7 Score 0 0 0 0  Anxiety Difficulty Not difficult at all Not difficult at all         05/12/2023    9:54 AM 04/21/2023    1:18 PM 03/24/2023    2:24 PM  Depression screen PHQ 2/9  Decreased Interest 0 0 0  Down, Depressed, Hopeless 0 0 0  PHQ - 2 Score 0 0 0  Altered sleeping 0 0 2  Tired, decreased energy 0 0 0  Change in appetite 0 0 0  Feeling bad or failure about yourself  0 0 0  Trouble concentrating 0 0 0  Moving slowly or fidgety/restless 0 0 0  Suicidal thoughts 0 0 0  PHQ-9 Score 0 0 2  Difficult doing work/chores Not difficult at all Not difficult at all Not difficult at all    BP Readings from Last 3 Encounters:  05/12/23 128/72  05/01/23 136/82  04/21/23 118/70    Physical Exam Vitals and nursing note reviewed.  Constitutional:      General: She is not in acute distress.    Appearance: She is well-developed.  HENT:     Head: Normocephalic and atraumatic.     Right Ear: Tympanic membrane and ear canal normal.     Left Ear: Tympanic membrane and ear  canal normal.     Nose:     Right Sinus: No maxillary sinus tenderness.     Left Sinus: No maxillary sinus tenderness.  Eyes:     General: No scleral icterus.       Right eye: No discharge.        Left eye: No discharge.     Conjunctiva/sclera: Conjunctivae normal.     Right eye: Chemosis present.  Neck:     Thyroid: No thyromegaly.     Vascular: No carotid bruit.  Cardiovascular:     Rate and Rhythm: Normal rate and regular rhythm.     Pulses: Normal pulses.     Heart sounds: Normal heart sounds.  Pulmonary:     Effort: Pulmonary effort is normal. No respiratory distress.     Breath sounds: No wheezing.  Chest:  Breasts:    Right: No mass, nipple discharge, skin change or tenderness.     Left: No mass, nipple discharge, skin change or tenderness.  Abdominal:     General: Bowel sounds are normal.     Palpations: Abdomen is soft.     Tenderness: There is no abdominal tenderness.  Musculoskeletal:     Cervical back: Normal range of motion. No erythema.     Right lower leg: No edema.     Left lower leg: No edema.  Lymphadenopathy:     Cervical: No cervical adenopathy.  Skin:    General: Skin is warm and dry.     Findings: No rash.  Neurological:     General: No focal deficit present.     Mental Status: She is alert and oriented to person, place, and time.     Cranial Nerves: No cranial nerve deficit.     Sensory: No sensory deficit.     Deep Tendon Reflexes: Reflexes are normal and symmetric.  Psychiatric:        Attention and Perception: Attention normal.        Mood and Affect: Mood normal.     Wt Readings from Last 3 Encounters:  05/12/23 167 lb (75.8 kg)  05/01/23 168 lb 9.6 oz (76.5 kg)  04/21/23 170 lb (77.1 kg)  BP 128/72   Pulse 65   Ht 5\' 3"  (1.6 m)   Wt 167 lb (75.8 kg)   SpO2 97%   BMI 29.58 kg/m   Assessment and Plan:  Problem List Items Addressed This Visit       Unprioritized   Stage 2 moderate COPD by GOLD classification (HCC)  (Chronic)    Symptoms fairly well controlled on Trelegy and PRN albuterol.      Relevant Orders   CBC with Differential/Platelet   Hyperlipidemia, mild (Chronic)    LDL is  Lab Results  Component Value Date   LDLCALC 92 03/21/2022   Currently being treated with atorvastatin with good compliance and no concerns.       Relevant Orders   Comprehensive metabolic panel   Lipid panel   Esophageal dysphagia    Seen by GI - Barium swallow: MPRESSION: 1. Focal narrowing of the distal esophagus at the esophagogastric junction restricting the passage of a 13 mm barium tablet consistent with a stricture. 2. Mild gastroesophageal reflux into the lower third of the esophagus. 3. Moderate size hiatal hernia. EGD planned      Relevant Medications   famotidine (PEPCID) 20 MG tablet   Age-related osteoporosis without current pathological fracture (Chronic)    Last DEXA 2021. On Calcium and vitamin D      Relevant Orders   DG Bone Density   VITAMIN D 25 Hydroxy (Vit-D Deficiency, Fractures)   Other Visit Diagnoses     Annual physical exam    -  Primary   Encounter for screening mammogram for breast cancer       Relevant Orders   MM 3D SCREENING MAMMOGRAM BILATERAL BREAST   Need for vaccination for pneumococcus       Relevant Orders   Pneumococcal conjugate vaccine 20-valent (Completed)   Other mucopurulent conjunctivitis of right eye       Relevant Medications   neomycin-polymyxin b-dexamethasone (MAXITROL) 3.5-10000-0.1 SUSP       No follow-ups on file.    Reubin Milan, MD [facility] [place]

## 2023-05-12 NOTE — Patient Instructions (Addendum)
Call [facility] to schedule your mammogram and Bone Density at [phone].   Colace (ducosate) 100 mg - stool softener. Take one a day with glass of fluid.  Start Pepcid twice a day.

## 2023-05-12 NOTE — Assessment & Plan Note (Signed)
LDL is  Lab Results  Component Value Date   LDLCALC 92 03/21/2022   Currently being treated with atorvastatin with good compliance and no concerns.

## 2023-05-12 NOTE — Assessment & Plan Note (Signed)
Seen by GI - Barium swallow: MPRESSION: 1. Focal narrowing of the distal esophagus at the esophagogastric junction restricting the passage of a 13 mm barium tablet consistent with a stricture. 2. Mild gastroesophageal reflux into the lower third of the esophagus. 3. Moderate size hiatal hernia. EGD planned

## 2023-05-28 ENCOUNTER — Other Ambulatory Visit: Payer: Self-pay | Admitting: Internal Medicine

## 2023-05-28 DIAGNOSIS — E785 Hyperlipidemia, unspecified: Secondary | ICD-10-CM

## 2023-06-09 ENCOUNTER — Telehealth: Payer: Self-pay

## 2023-06-09 ENCOUNTER — Telehealth: Payer: Self-pay | Admitting: Gastroenterology

## 2023-06-09 NOTE — Telephone Encounter (Signed)
Please the patient and advise. Thank you.

## 2023-06-09 NOTE — Telephone Encounter (Signed)
Patient called in because she has two new medication and she wanted to know if she can take them.

## 2023-06-09 NOTE — Telephone Encounter (Signed)
Patient wated to let me know she has started Famotidine 20mg  and Colace and wanted to be sure she can take this. She was instructed yes and to hold Vitamins, fish oil and iron if taking 5 days prior to EGD on 06-17-23.

## 2023-06-10 ENCOUNTER — Encounter: Payer: Self-pay | Admitting: Gastroenterology

## 2023-06-17 ENCOUNTER — Ambulatory Visit: Payer: PPO | Admitting: General Practice

## 2023-06-17 ENCOUNTER — Ambulatory Visit
Admission: RE | Admit: 2023-06-17 | Discharge: 2023-06-17 | Disposition: A | Discharge status: Home / Self Care | Payer: PPO | Attending: Gastroenterology | Admitting: Gastroenterology

## 2023-06-17 ENCOUNTER — Encounter: Payer: Self-pay | Admitting: Gastroenterology

## 2023-06-17 ENCOUNTER — Encounter
Admission: RE | Disposition: A | Discharge status: Home / Self Care | Payer: Self-pay | Source: Home / Self Care | Attending: Gastroenterology

## 2023-06-17 ENCOUNTER — Other Ambulatory Visit: Payer: Self-pay

## 2023-06-17 DIAGNOSIS — K209 Esophagitis, unspecified without bleeding: Secondary | ICD-10-CM | POA: Diagnosis not present

## 2023-06-17 DIAGNOSIS — K222 Esophageal obstruction: Secondary | ICD-10-CM | POA: Insufficient documentation

## 2023-06-17 DIAGNOSIS — K449 Diaphragmatic hernia without obstruction or gangrene: Secondary | ICD-10-CM | POA: Diagnosis not present

## 2023-06-17 DIAGNOSIS — Z7951 Long term (current) use of inhaled steroids: Secondary | ICD-10-CM | POA: Insufficient documentation

## 2023-06-17 DIAGNOSIS — Z87891 Personal history of nicotine dependence: Secondary | ICD-10-CM | POA: Insufficient documentation

## 2023-06-17 DIAGNOSIS — J439 Emphysema, unspecified: Secondary | ICD-10-CM | POA: Insufficient documentation

## 2023-06-17 DIAGNOSIS — R1310 Dysphagia, unspecified: Secondary | ICD-10-CM | POA: Diagnosis not present

## 2023-06-17 HISTORY — PX: ESOPHAGOGASTRODUODENOSCOPY (EGD) WITH PROPOFOL: SHX5813

## 2023-06-17 SURGERY — ESOPHAGOGASTRODUODENOSCOPY (EGD) WITH PROPOFOL
Anesthesia: General

## 2023-06-17 MED ORDER — PROPOFOL 10 MG/ML IV BOLUS
INTRAVENOUS | Status: DC | PRN
Start: 2023-06-17 — End: 2023-06-17
  Administered 2023-06-17: 30 mg via INTRAVENOUS
  Administered 2023-06-17: 50 mg via INTRAVENOUS
  Administered 2023-06-17: 20 mg via INTRAVENOUS

## 2023-06-17 MED ORDER — SODIUM CHLORIDE 0.9 % IV SOLN: INTRAVENOUS | Status: DC

## 2023-06-17 MED ORDER — LIDOCAINE HCL (PF) 2 % IJ SOLN
INTRAMUSCULAR | Status: AC
  Filled 2023-06-17: qty 5

## 2023-06-17 MED ORDER — OMEPRAZOLE MAGNESIUM 20 MG PO TBEC: 40.0000 mg | DELAYED_RELEASE_TABLET | Freq: Every day | ORAL | 1 refills | Status: DC

## 2023-06-17 MED ORDER — LIDOCAINE HCL (CARDIAC) PF 100 MG/5ML IV SOSY
PREFILLED_SYRINGE | INTRAVENOUS | Status: DC | PRN
  Administered 2023-06-17: 50 mg via INTRAVENOUS

## 2023-06-17 MED ORDER — PROPOFOL 10 MG/ML IV BOLUS
INTRAVENOUS | Status: AC
  Filled 2023-06-17: qty 20

## 2023-06-17 NOTE — H&P (Signed)
Wyline Mood, MD [address], [place], [place], [address] [address], [place], [place], [address] Phone: [phone]  Fax: [phone]  Primary Care Physician:  Reubin Milan, MD   Pre-Procedure History & Physical: HPI:  Eileen Mills is a 72 y.o. female is here for an endoscopy    Past Medical History:  Diagnosis Date   Acute respiratory failure with hypoxia (HCC) 08/17/2020   Arthritis    Cataract    COPD (chronic obstructive pulmonary disease) (HCC)    Emphysema of lung (HCC)    Hyperlipidemia     Past Surgical History:  Procedure Laterality Date   ABDOMINAL HYSTERECTOMY     CESAREAN SECTION     COLONOSCOPY N/A 03/03/2015   Procedure: COLONOSCOPY;  Surgeon: Midge Minium, MD;  Location: [facility];  Service: Gastroenterology;  Laterality: N/A;   POLYPECTOMY  03/03/2015   Procedure: POLYPECTOMY INTESTINAL;  Surgeon: Midge Minium, MD;  Location: [facility];  Service: Gastroenterology;;   TUBAL LIGATION      Prior to Admission medications   Medication Sig Start Date End Date Taking? Authorizing Provider  albuterol (PROVENTIL HFA;VENTOLIN HFA) 108 (90 Base) MCG/ACT inhaler Inhale 2 puffs into the lungs every 6 (six) hours as needed for wheezing or shortness of breath. 01/17/16   Triplett, Cari B, FNP  Ascorbic Acid (VITAMIN C) 1000 MG tablet Take 1,000 mg by mouth every other day.     [provider]  atorvastatin (LIPITOR) 10 MG tablet TAKE 1 TABLET BY MOUTH EVERY DAY 05/28/23   Reubin Milan, MD  Cholecalciferol 1.25 MG (50000 UT) capsule Take 5,000 Units by mouth daily.    [provider]  famotidine (PEPCID) 20 MG tablet Take 1 tablet (20 mg total) by mouth 2 (two) times daily. 05/12/23   Reubin Milan, MD  neomycin-polymyxin b-dexamethasone (MAXITROL) 3.5-10000-0.1 SUSP Place 2 drops into both eyes every 6 (six) hours. 05/12/23   Reubin Milan, MD  TRELEGY ELLIPTA 100-62.5-25 MCG/INH AEPB  Inhale 1 puff into the lungs daily. 03/07/21   [provider]  triamcinolone cream (KENALOG) 0.1 % Apply topically 2 (two) times daily. 12/24/21   [provider]  zinc gluconate 50 MG tablet Take 50 mg by mouth daily.    [provider]    Allergies as of 05/01/2023 - Review Complete 05/01/2023  Allergen Reaction Noted   Sulfa antibiotics Other (See Comments) 10/13/2015    Family History  Problem Relation Age of Onset   Alzheimer's disease Mother    CAD Father    Breast cancer Neg Hx     Social History   Socioeconomic History   Marital status: Widowed    Spouse name: Not on file   Number of children: 4   Years of education: Not on file   Highest education level: 12th grade  Occupational History   Not on file  Tobacco Use   Smoking status: Former    Current packs/day: 0.00    Average packs/day: 1.5 packs/day for 15.0 years (22.5 ttl pk-yrs)    Types: Cigarettes    Start date: 70    Quit date: 2012    Years since quitting: 12.7   Smokeless tobacco: Never  Vaping Use   Vaping status: Never Used  Substance and Sexual Activity   Alcohol use: No    Alcohol/week: 0.0 standard drinks of alcohol   Drug use: No   Sexual activity: Not Currently  Other Topics  Concern   Not on file  Social History Narrative   Not on file   Social Determinants of Health   Financial Resource Strain: Low Risk  (04/20/2023)   Overall Financial Resource Strain (CARDIA)    Difficulty of Paying Living Expenses: Not very hard  Food Insecurity: No Food Insecurity (04/20/2023)   Hunger Vital Sign    Worried About Running Out of Food in the Last Year: Never true    Ran Out of Food in the Last Year: Never true  Transportation Needs: No Transportation Needs (04/20/2023)   PRAPARE - Administrator, Civil Service (Medical): No    Lack of Transportation (Non-Medical): No  Physical Activity: Inactive (04/20/2023)   Exercise Vital Sign    Days of Exercise per Week: 0  days    Minutes of Exercise per Session: 0 min  Stress: No Stress Concern Present (04/20/2023)   [employer] of Occupational Health - Occupational Stress Questionnaire    Feeling of Stress : Only a little  Social Connections: Socially Isolated (04/20/2023)   Social Connection and Isolation Panel [NHANES]    Frequency of Communication with Friends and Family: More than three times a week    Frequency of Social Gatherings with Friends and Family: Once a week    Attends Religious Services: Never    Database administrator or Organizations: No    Attends Banker Meetings: Never    Marital Status: Widowed  Intimate Partner Violence: Not At Risk (03/24/2023)   Humiliation, Afraid, Rape, and Kick questionnaire    Fear of Current or Ex-Partner: No    Emotionally Abused: No    Physically Abused: No    Sexually Abused: No    Review of Systems: See HPI, otherwise negative ROS  Physical Exam: There were no vitals taken for this visit. General:   Alert,  pleasant and cooperative in NAD Head:  Normocephalic and atraumatic. Neck:  Supple; no masses or thyromegaly. Lungs:  Clear throughout to auscultation, normal respiratory effort.    Heart:  +S1, +S2, Regular rate and rhythm, No edema. Abdomen:  Soft, nontender and nondistended. Normal bowel sounds, without guarding, and without rebound.   Neurologic:  Alert and  oriented x4;  grossly normal neurologically.  Impression/Plan: Eileen Mills is here for an endoscopy  to be performed for  evaluation of dysphagia    Risks, benefits, limitations, and alternatives regarding endoscopy have been reviewed with the patient.  Questions have been answered.  All parties agreeable.   Wyline Mood, MD  06/17/2023, 10:25 AM

## 2023-06-17 NOTE — Op Note (Signed)
[facility] Gastroenterology Patient Name: Eileen Mills Procedure Date: 06/17/2023 10:58 AM MRN: [id number] Account #: [id number] Date of Birth: October 02, 1950 Admit Type: Outpatient Age: 72 Room: [facility] ENDO ROOM 2 Gender: Female Note Status: Finalized Instrument Name: Upper Endoscope [id number] Procedure:             Upper GI endoscopy Indications:           Dysphagia Providers:             Wyline Mood MD, MD Referring MD:          Bari Edward, MD (Referring MD) Medicines:             Monitored Anesthesia Care Complications:         No immediate complications. Procedure:             Pre-Anesthesia Assessment:                        - Prior to the procedure, a History and Physical was                         performed, and patient medications, allergies and                         sensitivities were reviewed. The patient's tolerance                         of previous anesthesia was reviewed.                        - The risks and benefits of the procedure and the                         sedation options and risks were discussed with the                         patient. All questions were answered and informed                         consent was obtained.                        - ASA Grade Assessment: II - A patient with mild                         systemic disease.                        After obtaining informed consent, the endoscope was                         passed under direct vision. Throughout the procedure,                         the patient's blood pressure, pulse, and oxygen                         saturations were monitored continuously. The Endoscope                         was introduced through  the mouth, and advanced to the                         third part of duodenum. The upper GI endoscopy was                         accomplished with ease. The patient tolerated the                         procedure well. Findings:      The examined duodenum  was normal.      A medium-sized hiatal hernia was present.      One benign-appearing, intrinsic mild stenosis was found at the       gastroesophageal junction. This stenosis measured 1.2 cm (inner       diameter). The stenosis was traversed. A TTS dilator was passed through       the scope. Dilation with a 12-13.5-15 mm balloon dilator was performed       to 15 mm. The dilation site was examined and showed moderate mucosal       disruption.      LA Grade A (one or more mucosal breaks less than 5 mm, not extending       between tops of 2 mucosal folds) esophagitis with no bleeding was found       at the gastroesophageal junction. Impression:            - Normal examined duodenum.                        - Medium-sized hiatal hernia.                        - Benign-appearing esophageal stenosis. Dilated.                        - LA Grade A esophagitis with no bleeding.                        - No specimens collected. Recommendation:        - Discharge patient to home (with escort).                        - Resume previous diet.                        - Continue present medications.                        - Use Prilosec (omeprazole) 40 mg PO daily                         indefinitely.                        - Repeat upper endoscopy in 3 weeks to evaluate the                         response to therapy. Procedure Code(s):     --- Professional ---                        [phone],  Esophagogastroduodenoscopy, flexible,                         transoral; with transendoscopic balloon dilation of                         esophagus (less than 30 mm diameter) Diagnosis Code(s):     --- Professional ---                        K44.9, Diaphragmatic hernia without obstruction or                         gangrene                        K22.2, Esophageal obstruction                        K20.90, Esophagitis, unspecified without bleeding                        R13.10, Dysphagia, unspecified CPT copyright  2022 American Medical Association. All rights reserved. The codes documented in this report are preliminary and upon coder review may  be revised to meet current compliance requirements. Wyline Mood, MD Wyline Mood MD, MD 06/17/2023 11:17:59 AM This report has been signed electronically. Number of Addenda: 0 Note Initiated On: 06/17/2023 10:58 AM Estimated Blood Loss:  Estimated blood loss: none.      [facility]

## 2023-06-17 NOTE — Anesthesia Preprocedure Evaluation (Signed)
Anesthesia Evaluation  Patient identified by MRN, date of birth, ID band Patient awake    Reviewed: Allergy & Precautions, NPO status , Patient's Chart, lab work & pertinent test results  Airway Mallampati: III  TM Distance: >3 FB Neck ROM: full    Dental  (+) Chipped, Dental Advidsory Given   Pulmonary COPD,  COPD inhaler, former smoker   Pulmonary exam normal        Cardiovascular negative cardio ROS Normal cardiovascular exam     Neuro/Psych negative neurological ROS  negative psych ROS   GI/Hepatic Neg liver ROS,GERD  Medicated,,  Endo/Other  negative endocrine ROS    Renal/GU negative Renal ROS  negative genitourinary   Musculoskeletal   Abdominal   Peds  Hematology negative hematology ROS (+)   Anesthesia Other Findings Past Medical History: 08/17/2020: Acute respiratory failure with hypoxia (HCC) No date: Arthritis No date: Cataract No date: COPD (chronic obstructive pulmonary disease) (HCC) No date: Emphysema of lung (HCC) No date: Hyperlipidemia  Past Surgical History: No date: ABDOMINAL HYSTERECTOMY No date: CESAREAN SECTION 03/03/2015: COLONOSCOPY; N/A     Comment:  Procedure: COLONOSCOPY;  Surgeon: Midge Minium, MD;                Location: [facility];  Service:               Gastroenterology;  Laterality: N/A; 03/03/2015: POLYPECTOMY     Comment:  Procedure: POLYPECTOMY INTESTINAL;  Surgeon: Midge Minium, MD;  Location: [facility];  Service:               Gastroenterology;; No date: TUBAL LIGATION  BMI    Body Mass Index: 29.65 kg/m      Reproductive/Obstetrics negative OB ROS                             Anesthesia Physical Anesthesia Plan  ASA: 2  Anesthesia Plan: General   Post-op Pain Management: Minimal or no pain anticipated   Induction: Intravenous  PONV Risk Score and Plan: 3 and Propofol infusion, TIVA and  Ondansetron  Airway Management Planned: Nasal Cannula  Additional Equipment: None  Intra-op Plan:   Post-operative Plan:   Informed Consent: I have reviewed the patients History and Physical, chart, labs and discussed the procedure including the risks, benefits and alternatives for the proposed anesthesia with the patient or authorized representative who has indicated his/her understanding and acceptance.     Dental advisory given  Plan Discussed with: CRNA and Surgeon  Anesthesia Plan Comments: (Discussed risks of anesthesia with patient, including possibility of difficulty with spontaneous ventilation under anesthesia necessitating airway intervention, PONV, and rare risks such as cardiac or respiratory or neurological events, and allergic reactions. Discussed the role of CRNA in patient's perioperative care. Patient understands.)       Anesthesia Quick Evaluation

## 2023-06-17 NOTE — Transfer of Care (Signed)
Immediate Anesthesia Transfer of Care Note  Patient: Eileen Mills  Procedure(s) Performed: ESOPHAGOGASTRODUODENOSCOPY (EGD) WITH PROPOFOL  Patient Location: PACU and Endoscopy Unit  Anesthesia Type:General  Level of Consciousness: awake, drowsy, and patient cooperative  Airway & Oxygen Therapy: Patient Spontanous Breathing  Post-op Assessment: Report given to RN and Post -op Vital signs reviewed and stable  Post vital signs: Reviewed and stable  Last Vitals:  Vitals Value Taken Time  BP 115/67 06/17/23 1120  Temp    Pulse 72 06/17/23 1120  Resp 24 06/17/23 1120  SpO2 94 % 06/17/23 1120  Vitals shown include unfiled device data.  Last Pain:  Vitals:   06/17/23 1049  TempSrc: Temporal  PainSc: 0-No pain         Complications: No notable events documented.

## 2023-06-17 NOTE — Anesthesia Postprocedure Evaluation (Signed)
Anesthesia Post Note  Patient: Eileen Mills  Procedure(s) Performed: ESOPHAGOGASTRODUODENOSCOPY (EGD) WITH PROPOFOL  Patient location during evaluation: PACU Anesthesia Type: General Level of consciousness: awake and alert Pain management: pain level controlled Vital Signs Assessment: post-procedure vital signs reviewed and stable Respiratory status: spontaneous breathing, nonlabored ventilation, respiratory function stable and patient connected to nasal cannula oxygen Cardiovascular status: blood pressure returned to baseline and stable Postop Assessment: no apparent nausea or vomiting Anesthetic complications: no  No notable events documented.   Last Vitals:  Vitals:   06/17/23 1049 06/17/23 1120  BP: (!) 146/65 115/67  Pulse: 61 73  Resp: 16 (!) 23  Temp: (!) 35.7 C (!) 36.3 C  SpO2: 98% 93%    Last Pain:  Vitals:   06/17/23 1120  TempSrc: Temporal  PainSc: Asleep                 Stephanie Coup

## 2023-06-18 ENCOUNTER — Encounter: Payer: Self-pay | Admitting: Gastroenterology

## 2023-07-29 ENCOUNTER — Ambulatory Visit (INDEPENDENT_AMBULATORY_CARE_PROVIDER_SITE_OTHER): Payer: PPO | Admitting: Internal Medicine

## 2023-07-29 ENCOUNTER — Encounter: Payer: Self-pay | Admitting: Internal Medicine

## 2023-07-29 VITALS — BP 120/78 | HR 72 | Temp 98.0°F | Ht 63.0 in | Wt 169.0 lb

## 2023-07-29 DIAGNOSIS — J0100 Acute maxillary sinusitis, unspecified: Secondary | ICD-10-CM

## 2023-07-29 MED ORDER — PROMETHAZINE-DM 6.25-15 MG/5ML PO SYRP
5.0000 mL | ORAL_SOLUTION | Freq: Four times a day (QID) | ORAL | 0 refills | Status: AC | PRN
Start: 2023-07-29 — End: 2023-08-07

## 2023-07-29 MED ORDER — AZITHROMYCIN 250 MG PO TABS
ORAL_TABLET | ORAL | 0 refills | Status: AC
Start: 2023-07-29 — End: 2023-08-03

## 2023-07-29 NOTE — Progress Notes (Signed)
Date:  07/29/2023   Name:  Eileen Mills   DOB:  1951/04/22   MRN:  [id number]   Chief Complaint: Cough  Cough This is a recurrent problem. Episode onset: X2 weeks. The problem has been gradually worsening. Episode frequency: comes and goes. The cough is Productive of sputum (clear). Associated symptoms include nasal congestion, rhinorrhea and wheezing. Pertinent negatives include no chest pain, chills, fever, headaches, sore throat or shortness of breath. Treatments tried: dayquil, nyquil, mucinex, vapor rub. The treatment provided no relief.    Review of Systems  Constitutional:  Negative for chills, fatigue and fever.  HENT:  Positive for congestion, rhinorrhea and sinus pressure. Negative for sore throat, tinnitus and trouble swallowing.   Respiratory:  Positive for cough and wheezing. Negative for chest tightness and shortness of breath.   Cardiovascular:  Negative for chest pain and palpitations.  Gastrointestinal:  Negative for diarrhea and vomiting.  Neurological:  Negative for dizziness, light-headedness and headaches.  Psychiatric/Behavioral:  Positive for sleep disturbance. Negative for dysphoric mood. The patient is not nervous/anxious.      Lab Results  Component Value Date   NA 141 05/12/2023   K 4.5 05/12/2023   CO2 25 05/12/2023   GLUCOSE 96 05/12/2023   BUN 12 05/12/2023   CREATININE 0.98 05/12/2023   CALCIUM 9.7 05/12/2023   EGFR 62 05/12/2023   GFRNONAA >60 08/18/2020   Lab Results  Component Value Date   CHOL 184 05/12/2023   HDL 85 05/12/2023   LDLCALC 77 05/12/2023   TRIG 133 05/12/2023   CHOLHDL 2.2 05/12/2023   Lab Results  Component Value Date   TSH 2.850 03/21/2022   No results found for: "HGBA1C" Lab Results  Component Value Date   WBC 7.5 05/12/2023   HGB 12.9 05/12/2023   HCT 39.9 05/12/2023   MCV 89 05/12/2023   PLT 308 05/12/2023   Lab Results  Component Value Date   ALT 17 05/12/2023   AST 15 05/12/2023   ALKPHOS  91 05/12/2023   BILITOT 0.4 05/12/2023   Lab Results  Component Value Date   VD25OH 51.0 05/12/2023     Patient Active Problem List   Diagnosis Date Noted   Dysphagia 03/24/2023   Age-related osteoporosis without current pathological fracture 01/21/2021   Aortic atherosclerosis (HCC) 03/30/2020   Stage 2 moderate COPD by GOLD classification (HCC) 01/07/2017   Ganglion cyst of wrist 07/02/2015   Intermittent palpitations 07/02/2015   Degenerative arthritis of spine with cord compression 07/02/2015   Abdominal bloating 07/02/2015   Baker's cyst of knee 07/02/2015   Hyperlipidemia, mild 07/02/2015    Allergies  Allergen Reactions   Sulfa Antibiotics Other (See Comments)    It was a long time ago, cannot remember reaction.    Past Surgical History:  Procedure Laterality Date   ABDOMINAL HYSTERECTOMY     CESAREAN SECTION     COLONOSCOPY N/A 03/03/2015   Procedure: COLONOSCOPY;  Surgeon: Midge Minium, MD;  Location: [facility];  Service: Gastroenterology;  Laterality: N/A;   ESOPHAGOGASTRODUODENOSCOPY (EGD) WITH PROPOFOL N/A 06/17/2023   Procedure: ESOPHAGOGASTRODUODENOSCOPY (EGD) WITH PROPOFOL;  Surgeon: Wyline Mood, MD;  Location: [facility] END[facility];  Service: Gastroenterology;  Laterality: N/A;   POLYPECTOMY  03/03/2015   Procedure: POLYPECTOMY INTESTINAL;  Surgeon: Midge Minium, MD;  Location: [facility];  Service: Gastroenterology;;   TUBAL LIGATION      Social History   Tobacco Use   Smoking status: Former    Current packs/day: 0.00  Average packs/day: 1.5 packs/day for 15.0 years (22.5 ttl pk-yrs)    Types: Cigarettes    Start date: 80    Quit date: 2012    Years since quitting: 12.8   Smokeless tobacco: Never  Vaping Use   Vaping status: Never Used  Substance Use Topics   Alcohol use: No    Alcohol/week: 0.0 standard drinks of alcohol   Drug use: No     Medication list has been reviewed and updated.  Current Meds  Medication Sig    albuterol (PROVENTIL HFA;VENTOLIN HFA) 108 (90 Base) MCG/ACT inhaler Inhale 2 puffs into the lungs every 6 (six) hours as needed for wheezing or shortness of breath.   Ascorbic Acid (VITAMIN C) 1000 MG tablet Take 1,000 mg by mouth every other day.    atorvastatin (LIPITOR) 10 MG tablet TAKE 1 TABLET BY MOUTH EVERY DAY   azithromycin (ZITHROMAX Z-PAK) 250 MG tablet UAD   Cholecalciferol 1.25 MG (50000 UT) capsule Take 5,000 Units by mouth daily.   neomycin-polymyxin b-dexamethasone (MAXITROL) 3.5-10000-0.1 SUSP Place 2 drops into both eyes every 6 (six) hours.   omeprazole (PRILOSEC OTC) 20 MG tablet Take 2 tablets (40 mg total) by mouth daily.   promethazine-dextromethorphan (PROMETHAZINE-DM) 6.25-15 MG/5ML syrup Take 5 mLs by mouth 4 (four) times daily as needed for up to 9 days for cough.   TRELEGY ELLIPTA 100-62.5-25 MCG/INH AEPB Inhale 1 puff into the lungs daily.   triamcinolone cream (KENALOG) 0.1 % Apply topically 2 (two) times daily.   zinc gluconate 50 MG tablet Take 50 mg by mouth daily.       07/29/2023   10:26 AM 05/12/2023    9:54 AM 04/21/2023    1:18 PM 03/21/2022    8:54 AM  GAD 7 : Generalized Anxiety Score  Nervous, Anxious, on Edge 0 0 0 0  Control/stop worrying 0 0 0 0  Worry too much - different things 0 0 0 0  Trouble relaxing 0 0 0 0  Restless 0 0 0 0  Easily annoyed or irritable 0 0 0 0  Afraid - awful might happen 0 0 0 0  Total GAD 7 Score 0 0 0 0  Anxiety Difficulty Not difficult at all Not difficult at all Not difficult at all        07/29/2023   10:26 AM 05/12/2023    9:54 AM 04/21/2023    1:18 PM  Depression screen PHQ 2/9  Decreased Interest 0 0 0  Down, Depressed, Hopeless 0 0 0  PHQ - 2 Score 0 0 0  Altered sleeping 0 0 0  Tired, decreased energy 0 0 0  Change in appetite 0 0 0  Feeling bad or failure about yourself  0 0 0  Trouble concentrating 0 0 0  Moving slowly or fidgety/restless 0 0 0  Suicidal thoughts 0 0 0  PHQ-9 Score 0 0 0   Difficult doing work/chores Not difficult at all Not difficult at all Not difficult at all    BP Readings from Last 3 Encounters:  07/29/23 120/78  06/17/23 (!) 140/80  05/12/23 128/72    Physical Exam Vitals and nursing note reviewed.  Constitutional:      General: She is not in acute distress.    Appearance: Normal appearance. She is well-developed.  HENT:     Head: Normocephalic and atraumatic.     Right Ear: Tympanic membrane and ear canal normal.     Left Ear: Tympanic membrane and ear canal  normal.     Nose:     Right Sinus: Maxillary sinus tenderness present. No frontal sinus tenderness.     Left Sinus: Maxillary sinus tenderness present. No frontal sinus tenderness.     Mouth/Throat:     Pharynx: Posterior oropharyngeal erythema present. No oropharyngeal exudate.  Cardiovascular:     Rate and Rhythm: Normal rate and regular rhythm.     Heart sounds: No murmur heard. Pulmonary:     Effort: Pulmonary effort is normal. No respiratory distress.     Breath sounds: Normal breath sounds. No wheezing or rhonchi.  Musculoskeletal:     Cervical back: Normal range of motion.     Right lower leg: No edema.     Left lower leg: No edema.  Lymphadenopathy:     Cervical: No cervical adenopathy.  Skin:    General: Skin is warm and dry.     Findings: No rash.  Neurological:     Mental Status: She is alert and oriented to person, place, and time.  Psychiatric:        Mood and Affect: Mood normal.        Behavior: Behavior normal.     Wt Readings from Last 3 Encounters:  07/29/23 169 lb (76.7 kg)  06/17/23 167 lb 6.4 oz (75.9 kg)  05/12/23 167 lb (75.8 kg)    BP 120/78 (BP Location: Right Arm, Cuff Size: Large)   Pulse 72   Temp 98 F (36.7 C) (Oral)   Ht 5\' 3"  (1.6 m)   Wt 169 lb (76.7 kg)   SpO2 96%   BMI 29.94 kg/m   Assessment and Plan:  Problem List Items Addressed This Visit   None Visit Diagnoses     Acute non-recurrent maxillary sinusitis    -   Primary   viral infection now progressing to sinus and bronchitis in pt with COPD begin Allegra 180 mg daily Zpak and Rx cough suppression   Relevant Medications   azithromycin (ZITHROMAX Z-PAK) 250 MG tablet   promethazine-dextromethorphan (PROMETHAZINE-DM) 6.25-15 MG/5ML syrup       No follow-ups on file.    Reubin Milan, MD [facility] [place]

## 2023-08-05 ENCOUNTER — Ambulatory Visit
Admission: RE | Admit: 2023-08-05 | Discharge: 2023-08-05 | Disposition: A | Discharge status: Home / Self Care | Payer: PPO | Source: Ambulatory Visit | Attending: Internal Medicine | Admitting: Internal Medicine

## 2023-08-05 DIAGNOSIS — M85851 Other specified disorders of bone density and structure, right thigh: Secondary | ICD-10-CM | POA: Diagnosis not present

## 2023-08-05 DIAGNOSIS — Z1231 Encounter for screening mammogram for malignant neoplasm of breast: Secondary | ICD-10-CM | POA: Diagnosis not present

## 2023-08-05 DIAGNOSIS — M810 Age-related osteoporosis without current pathological fracture: Secondary | ICD-10-CM | POA: Insufficient documentation

## 2023-09-01 ENCOUNTER — Ambulatory Visit
Admission: RE | Admit: 2023-09-01 | Discharge: 2023-09-01 | Disposition: A | Discharge status: Home / Self Care | Payer: PPO | Source: Ambulatory Visit | Attending: Acute Care | Admitting: Acute Care

## 2023-09-01 DIAGNOSIS — Z87891 Personal history of nicotine dependence: Secondary | ICD-10-CM | POA: Diagnosis not present

## 2023-09-01 DIAGNOSIS — Z122 Encounter for screening for malignant neoplasm of respiratory organs: Secondary | ICD-10-CM | POA: Insufficient documentation

## 2023-09-15 ENCOUNTER — Ambulatory Visit: Payer: PPO | Admitting: Physician Assistant

## 2023-09-15 ENCOUNTER — Ambulatory Visit: Payer: Self-pay

## 2023-09-15 NOTE — Telephone Encounter (Signed)
    Chief Complaint: Left side abdominal pain, comes and goes. Sharp pain Symptoms: Above Frequency: 1-2 hours Pertinent Negatives: Patient denies  Disposition: [] ED /[] Urgent Care (no appt availability in office) / [x] Appointment(In office/virtual)/ []  [place] [facility]/ [] Home Care/ [] Refused Recommended Disposition /[] [place] [facility]/ []  Follow-up with PCP Additional Notes: Agrees with appointment.  Reason for Disposition  Age > 60 years  Answer Assessment - Initial Assessment Questions 1. LOCATION: "Where does it hurt?"      Left ribs and down stomach 2. RADIATION: "Does the pain shoot anywhere else?" (e.g., chest, back)     No 3. ONSET: "When did the pain begin?" (e.g., minutes, hours or days ago)      2 hours 4. SUDDEN: "Gradual or sudden onset?"     Sudden 5. PATTERN "Does the pain come and go, or is it constant?"    - If it comes and goes: "How long does it last?" "Do you have pain now?"     (Note: Comes and goes means the pain is intermittent. It goes away completely between bouts.)    - If constant: "Is it getting better, staying the same, or getting worse?"      (Note: Constant means the pain never goes away completely; most serious pain is constant and gets worse.)      Comes and goes 6. SEVERITY: "How bad is the pain?"  (e.g., Scale 1-10; mild, moderate, or severe)    - MILD (1-3): Doesn't interfere with normal activities, abdomen soft and not tender to touch.     - MODERATE (4-7): Interferes with normal activities or awakens from sleep, abdomen tender to touch.     - SEVERE (8-10): Excruciating pain, doubled over, unable to do any normal activities.       Sharp -  5 7. RECURRENT SYMPTOM: "Have you ever had this type of stomach pain before?" If Yes, ask: "When was the last time?" and "What happened that time?"      Yes 8. CAUSE: "What do you think is causing the stomach pain?"     Unsure 9. RELIEVING/AGGRAVATING FACTORS: "What makes it better or worse?"  (e.g., antacids, bending or twisting motion, bowel movement)     Sitting still 10. OTHER SYMPTOMS: "Do you have any other symptoms?" (e.g., back pain, diarrhea, fever, urination pain, vomiting)       No 11. PREGNANCY: "Is there any chance you are pregnant?" "When was your last menstrual period?"       No  Protocols used: Abdominal Pain - [facility]

## 2023-09-18 ENCOUNTER — Other Ambulatory Visit: Payer: Self-pay | Admitting: Acute Care

## 2023-09-18 DIAGNOSIS — Z122 Encounter for screening for malignant neoplasm of respiratory organs: Secondary | ICD-10-CM

## 2023-09-18 DIAGNOSIS — J449 Chronic obstructive pulmonary disease, unspecified: Secondary | ICD-10-CM | POA: Diagnosis not present

## 2023-09-18 DIAGNOSIS — R911 Solitary pulmonary nodule: Secondary | ICD-10-CM | POA: Diagnosis not present

## 2023-09-18 DIAGNOSIS — Z87891 Personal history of nicotine dependence: Secondary | ICD-10-CM

## 2023-10-30 ENCOUNTER — Other Ambulatory Visit: Payer: Self-pay | Admitting: Internal Medicine

## 2023-10-30 DIAGNOSIS — R1319 Other dysphagia: Secondary | ICD-10-CM

## 2023-10-31 NOTE — Telephone Encounter (Signed)
Requested medications are due for refill today.  Unsure  Requested medications are on the active medications list.  no  Last refill. Only prescribed 1 time - 05/12/2023 #180 0 rf  Future visit scheduled.   yes  Notes to clinic.  Medication was d/c's at discharge. Please review for refil.    Requested Prescriptions  Pending Prescriptions Disp Refills   famotidine (PEPCID) 20 MG tablet [Pharmacy Med Name: FAMOTIDINE 20 MG TABLET] 180 tablet 0    Sig: TAKE 1 TABLET BY MOUTH TWICE A DAY     Gastroenterology:  H2 Antagonists Passed - 10/31/2023  9:57 AM      Passed - Valid encounter within last 12 months    Recent Outpatient Visits           3 months ago Acute non-recurrent maxillary sinusitis   [place] [facility] at [facility], Nyoka Cowden, MD   5 months ago Annual physical exam   [facility] at [facility], Nyoka Cowden, MD   6 months ago Acute exacerbation of chronic obstructive pulmonary disease (COPD) [facility])   [place] [facility] at [facility], Nyoka Cowden, MD   7 months ago Dysuria   [facility] at [facility], Nyoka Cowden, MD   1 year ago Annual physical exam   [facility] at [facility], Nyoka Cowden, MD       Future Appointments             In 2 weeks Judithann Graves, Nyoka Cowden, MD [facility] at [facility], [facility]   In 6 months Judithann Graves, Nyoka Cowden, MD [facility] at [facility], [facility]

## 2023-11-05 ENCOUNTER — Telehealth: Payer: Self-pay | Admitting: Internal Medicine

## 2023-11-05 ENCOUNTER — Other Ambulatory Visit: Payer: Self-pay

## 2023-11-05 DIAGNOSIS — R1319 Other dysphagia: Secondary | ICD-10-CM

## 2023-11-05 MED ORDER — FAMOTIDINE 20 MG PO TABS
20.0000 mg | ORAL_TABLET | Freq: Two times a day (BID) | ORAL | 0 refills | Status: DC
Start: 2023-11-05 — End: 2024-03-09

## 2023-11-05 NOTE — Telephone Encounter (Signed)
 Medication Refill -  Most Recent Primary Care Visit:  Provider: BERGLUND, LAURA H  Department: [id number]  Visit Type: OFFICE VISIT  Date: 07/29/2023  Medication: famotidine  (PEPCID ) 20 MG tablet  Pt unsure why it was denied. Pt states it has really helped and requesting refill.   Has the patient contacted their pharmacy? Yes  Is this the correct pharmacy for this prescription? Yes This is the patient's preferred pharmacy:  [id number] - [place], [place] - [address] [place] [address] Phone: [phone] Fax: [phone]   Has the prescription been filled recently? No  Is the patient out of the medication? No  Has the patient been seen for an appointment in the last year OR does the patient have an upcoming appointment? Yes  Can we respond through MyChart? Yes  Agent: Please be advised that Rx refills may take up to 3 business days. We ask that you follow-up with your pharmacy.

## 2023-11-05 NOTE — Telephone Encounter (Signed)
 Refill sent.

## 2023-11-18 ENCOUNTER — Ambulatory Visit (INDEPENDENT_AMBULATORY_CARE_PROVIDER_SITE_OTHER): Payer: PPO | Admitting: Internal Medicine

## 2023-11-18 ENCOUNTER — Ambulatory Visit: Payer: PPO | Admitting: Internal Medicine

## 2023-11-18 ENCOUNTER — Encounter: Payer: Self-pay | Admitting: Internal Medicine

## 2023-11-18 VITALS — BP 124/78 | HR 79 | Ht 63.0 in | Wt 166.0 lb

## 2023-11-18 DIAGNOSIS — F418 Other specified anxiety disorders: Secondary | ICD-10-CM | POA: Diagnosis not present

## 2023-11-18 MED ORDER — LORAZEPAM 0.5 MG PO TABS
0.5000 mg | ORAL_TABLET | Freq: Every evening | ORAL | 1 refills | Status: DC | PRN
Start: 2023-11-18 — End: 2024-06-04

## 2023-11-18 NOTE — Progress Notes (Signed)
Date:  11/18/2023   Name:  Eileen Mills   DOB:  1951-09-28   MRN:  [id number]   Chief Complaint: Anxiety  Anxiety Presents for initial visit. The problem has been gradually worsening. Symptoms include excessive worry, nervous/anxious behavior, palpitations, restlessness and shortness of breath. Patient reports no chest pain, dizziness or suicidal ideas. Symptoms occur most days. The severity of symptoms is causing significant distress.    This all started when the smoke detectors in her apartment started going off all the time.  One morning it went off at 5 AM and she had to listen to it for 3 hours until her son in law could come.  The landlord has tried different things and they kept triggering.  She lies awake at night and is anxious that they will go off.  Also her car is broken down and she feels isolated. She plans to move in with her daughter and grand son in May and just needs something to help her relax temporarily.  Review of Systems  Constitutional:  Negative for chills, fatigue and fever.  Respiratory:  Positive for chest tightness and shortness of breath.   Cardiovascular:  Positive for palpitations. Negative for chest pain.  Neurological:  Negative for dizziness, light-headedness and headaches.  Psychiatric/Behavioral:  Positive for sleep disturbance. Negative for dysphoric mood and suicidal ideas. The patient is nervous/anxious.      Lab Results  Component Value Date   NA 141 05/12/2023   K 4.5 05/12/2023   CO2 25 05/12/2023   GLUCOSE 96 05/12/2023   BUN 12 05/12/2023   CREATININE 0.98 05/12/2023   CALCIUM 9.7 05/12/2023   EGFR 62 05/12/2023   GFRNONAA >60 08/18/2020   Lab Results  Component Value Date   CHOL 184 05/12/2023   HDL 85 05/12/2023   LDLCALC 77 05/12/2023   TRIG 133 05/12/2023   CHOLHDL 2.2 05/12/2023   Lab Results  Component Value Date   TSH 2.850 03/21/2022   No results found for: "HGBA1C" Lab Results  Component Value Date    WBC 7.5 05/12/2023   HGB 12.9 05/12/2023   HCT 39.9 05/12/2023   MCV 89 05/12/2023   PLT 308 05/12/2023   Lab Results  Component Value Date   ALT 17 05/12/2023   AST 15 05/12/2023   ALKPHOS 91 05/12/2023   BILITOT 0.4 05/12/2023   Lab Results  Component Value Date   VD25OH 51.0 05/12/2023     Patient Active Problem List   Diagnosis Date Noted   Situational anxiety 11/18/2023   Dysphagia 03/24/2023   Age-related osteoporosis without current pathological fracture 01/21/2021   Aortic atherosclerosis (HCC) 03/30/2020   Stage 2 moderate COPD by GOLD classification (HCC) 01/07/2017   Ganglion cyst of wrist 07/02/2015   Intermittent palpitations 07/02/2015   Degenerative arthritis of spine with cord compression 07/02/2015   Abdominal bloating 07/02/2015   Baker's cyst of knee 07/02/2015   Hyperlipidemia, mild 07/02/2015    Allergies  Allergen Reactions   Sulfa Antibiotics Other (See Comments)    It was a long time ago, cannot remember reaction.    Past Surgical History:  Procedure Laterality Date   ABDOMINAL HYSTERECTOMY     CESAREAN SECTION     COLONOSCOPY N/A 03/03/2015   Procedure: COLONOSCOPY;  Surgeon: Midge Minium, MD;  Location: [facility];  Service: Gastroenterology;  Laterality: N/A;   ESOPHAGOGASTRODUODENOSCOPY (EGD) WITH PROPOFOL N/A 06/17/2023   Procedure: ESOPHAGOGASTRODUODENOSCOPY (EGD) WITH PROPOFOL;  Surgeon: Wyline Mood, MD;  Location: [facility];  Service: Gastroenterology;  Laterality: N/A;   POLYPECTOMY  03/03/2015   Procedure: POLYPECTOMY INTESTINAL;  Surgeon: Midge Minium, MD;  Location: [facility];  Service: Gastroenterology;;   TUBAL LIGATION      Social History   Tobacco Use   Smoking status: Former    Current packs/day: 0.00    Average packs/day: 1.5 packs/day for 15.0 years (22.5 ttl pk-yrs)    Types: Cigarettes    Start date: 26    Quit date: 2012    Years since quitting: 13.1   Smokeless tobacco: Never  Vaping Use    Vaping status: Never Used  Substance Use Topics   Alcohol use: No    Alcohol/week: 0.0 standard drinks of alcohol   Drug use: No     Medication list has been reviewed and updated.  Current Meds  Medication Sig   albuterol (PROVENTIL HFA;VENTOLIN HFA) 108 (90 Base) MCG/ACT inhaler Inhale 2 puffs into the lungs every 6 (six) hours as needed for wheezing or shortness of breath.   Ascorbic Acid (VITAMIN C) 1000 MG tablet Take 1,000 mg by mouth every other day.    atorvastatin (LIPITOR) 10 MG tablet TAKE 1 TABLET BY MOUTH EVERY DAY   Cholecalciferol 1.25 MG (50000 UT) capsule Take 5,000 Units by mouth daily.   famotidine (PEPCID) 20 MG tablet Take 1 tablet (20 mg total) by mouth 2 (two) times daily.   LORazepam (ATIVAN) 0.5 MG tablet Take 1 tablet (0.5 mg total) by mouth at bedtime as needed for anxiety.   neomycin-polymyxin b-dexamethasone (MAXITROL) 3.5-10000-0.1 SUSP Place 2 drops into both eyes every 6 (six) hours.   omeprazole (PRILOSEC OTC) 20 MG tablet Take 2 tablets (40 mg total) by mouth daily.   TRELEGY ELLIPTA 100-62.5-25 MCG/INH AEPB Inhale 1 puff into the lungs daily.   triamcinolone cream (KENALOG) 0.1 % Apply topically 2 (two) times daily.   zinc gluconate 50 MG tablet Take 50 mg by mouth daily.       11/18/2023    3:42 PM 07/29/2023   10:26 AM 05/12/2023    9:54 AM 04/21/2023    1:18 PM  GAD 7 : Generalized Anxiety Score  Nervous, Anxious, on Edge 2 0 0 0  Control/stop worrying 2 0 0 0  Worry too much - different things 2 0 0 0  Trouble relaxing 2 0 0 0  Restless 2 0 0 0  Easily annoyed or irritable 0 0 0 0  Afraid - awful might happen 2 0 0 0  Total GAD 7 Score 12 0 0 0  Anxiety Difficulty Not difficult at all Not difficult at all Not difficult at all Not difficult at all       11/18/2023    3:41 PM 07/29/2023   10:26 AM 05/12/2023    9:54 AM  Depression screen PHQ 2/9  Decreased Interest 0 0 0  Down, Depressed, Hopeless 2 0 0  PHQ - 2 Score 2 0 0   Altered sleeping 0 0 0  Tired, decreased energy 2 0 0  Change in appetite 0 0 0  Feeling bad or failure about yourself  0 0 0  Trouble concentrating 0 0 0  Moving slowly or fidgety/restless 2 0 0  Suicidal thoughts 0 0 0  PHQ-9 Score 6 0 0  Difficult doing work/chores Not difficult at all Not difficult at all Not difficult at all    BP Readings from Last 3 Encounters:  11/18/23 124/78  07/29/23 120/78  06/17/23 (!) 140/80    Physical Exam Constitutional:      Appearance: Normal appearance.  Cardiovascular:     Rate and Rhythm: Normal rate and regular rhythm.  Pulmonary:     Effort: No respiratory distress.     Breath sounds: No stridor. No wheezing or rhonchi.  Musculoskeletal:     Cervical back: Normal range of motion.  Neurological:     Mental Status: She is alert.  Psychiatric:        Attention and Perception: Attention normal.        Mood and Affect: Affect is tearful.        Speech: Speech normal.        Thought Content: Thought content does not include suicidal ideation. Thought content does not include suicidal plan.        Cognition and Memory: Cognition normal.        Judgment: Judgment normal.     Wt Readings from Last 3 Encounters:  11/18/23 166 lb (75.3 kg)  07/29/23 169 lb (76.7 kg)  06/17/23 167 lb 6.4 oz (75.9 kg)    BP 124/78   Pulse 79   Ht 5\' 3"  (1.6 m)   Wt 166 lb (75.3 kg)   SpO2 94%   BMI 29.41 kg/m   Assessment and Plan:  Problem List Items Addressed This Visit       Unprioritized   Situational anxiety - Primary   Triggered by recent isolation, worry about losing her independence and frequent smoke alarms. Plans to move in with daughter will help reduce her symptoms. Will give Ativan 0.5 mg at bedtime as needed - try to go to sleep naturally but if unable, can take Ativan. Follow up if needed.      Relevant Medications   LORazepam (ATIVAN) 0.5 MG tablet    No follow-ups on file.    Reubin Milan, MD [facility]  Primary Care and Sports Medicine [place]

## 2023-11-18 NOTE — Assessment & Plan Note (Addendum)
Triggered by recent isolation, worry about losing her independence and frequent smoke alarms. Plans to move in with daughter will help reduce her symptoms. Will give Ativan 0.5 mg at bedtime as needed - try to go to sleep naturally but if unable, can take Ativan. Follow up if needed.

## 2023-11-25 DIAGNOSIS — M545 Low back pain, unspecified: Secondary | ICD-10-CM | POA: Diagnosis not present

## 2024-01-28 ENCOUNTER — Encounter: Payer: Self-pay | Admitting: Internal Medicine

## 2024-01-28 ENCOUNTER — Ambulatory Visit (INDEPENDENT_AMBULATORY_CARE_PROVIDER_SITE_OTHER): Admitting: Internal Medicine

## 2024-01-28 ENCOUNTER — Ambulatory Visit: Payer: Self-pay | Admitting: *Deleted

## 2024-01-28 VITALS — BP 126/78 | HR 71 | Ht 63.0 in | Wt 162.4 lb

## 2024-01-28 DIAGNOSIS — R42 Dizziness and giddiness: Secondary | ICD-10-CM | POA: Diagnosis not present

## 2024-01-28 DIAGNOSIS — F321 Major depressive disorder, single episode, moderate: Secondary | ICD-10-CM | POA: Insufficient documentation

## 2024-01-28 MED ORDER — SERTRALINE HCL 50 MG PO TABS: 50.0000 mg | ORAL_TABLET | Freq: Every day | ORAL | 1 refills | Status: DC

## 2024-01-28 NOTE — Patient Instructions (Signed)
 Take sertraline 1/2 tablet daily for 4 days. If no side effects, increase to 1 tablet daily.

## 2024-01-28 NOTE — Telephone Encounter (Signed)
Noted. Patient was seen in clinic.

## 2024-01-28 NOTE — Telephone Encounter (Signed)
 Copied from CRM [id number]. Topic: Clinical - Red Word Triage >> Jan 28, 2024  8:56 AM Zina Hilts wrote: Red Word that prompted transfer to Nurse Triage: pt calling having S.O.B and anxiety, with some slight pain in chest and neck Reason for Disposition  [1] Longstanding difficulty breathing (e.g., CHF, COPD, emphysema) AND [2] WORSE than normal  Answer Assessment - Initial Assessment Questions 1. RESPIRATORY STATUS: "Describe your breathing?" (e.g., wheezing, shortness of breath, unable to speak, severe coughing)      I have anxiety.  I don't have medication for it.    I'm having shortness of breath to the point of feeling like I'm going to pass out.   I feel shaky, sick and it comes and goes.    I'm having pain in my left side of my neck.  I'm not having pain in my chest.   I don't know what is going on.   I feel weird.   No coughing or fever. I don't have anything for anxiety.   Dr. Iantha Mainland gave me Lorazapam for night.  Nothing for during the day.   2. ONSET: "When did this breathing problem begin?"      The shortness of breath started a few days ago.   Yesterday morning I woke up and was not having anxiety.   Then suddenly I felt like I was going to black out.   I had to breath real deep and I got better.   I feel like my heart fading out yesterday morning.   I feel weak and shaky too when it happens.  I have nausea too.   I have not vomited. 3. PATTERN "Does the difficult breathing come and go, or has it been constant since it started?"      It comes and goes.   Yesterday morning it was bad.   Dr. Gala Jubilee is aware of the anxiety starting back in Jan.   I feel like it's getting worse. 4. SEVERITY: "How bad is your breathing?" (e.g., mild, moderate, severe)    - MILD: No SOB at rest, mild SOB with walking, speaks normally in sentences, can lie down, no retractions, pulse < 100.    - MODERATE: SOB at rest, SOB with minimal exertion and prefers to sit, cannot lie down flat, speaks in phrases, mild  retractions, audible wheezing, pulse 100-120.    - SEVERE: Very SOB at rest, speaks in single words, struggling to breathe, sitting hunched forward, retractions, pulse > 120      Severe because I was seeing black.   I started deep breathing and I was ok. I have COPD but no heart problems. 5. RECURRENT SYMPTOM: "Have you had difficulty breathing before?" If Yes, ask: "When was the last time?" and "What happened that time?"      This is all new with the anxiety symptoms.   But I'm getting worse.    6. CARDIAC HISTORY: "Do you have any history of heart disease?" (e.g., heart attack, angina, bypass surgery, angioplasty)      No 7. LUNG HISTORY: "Do you have any history of lung disease?"  (e.g., pulmonary embolus, asthma, emphysema)     Yes COPD 8. CAUSE: "What do you think is causing the breathing problem?"      Anxiety getting worse. 9. OTHER SYMPTOMS: "Do you have any other symptoms? (e.g., dizziness, runny nose, cough, chest pain, fever)     See above 10. O2 SATURATION MONITOR:  "Do you use an oxygen  saturation monitor (pulse  oximeter) at home?" If Yes, ask: "What is your reading (oxygen  level) today?" "What is your usual oxygen  saturation reading?" (e.g., 95%)       N/A 11. PREGNANCY: "Is there any chance you are pregnant?" "When was your last menstrual period?"       N/A due to age 11. TRAVEL: "Have you traveled out of the country in the last month?" (e.g., travel history, exposures)       N/A  Protocols used: Breathing Difficulty-A-AH  Chief Complaint: Worsening anxiety  This all started with a smoke detector constantly going off.    They had to disconnect all my smoke detectors because they kept going off and could not find out why.   This has made my anxiety worse.  "I know that seems weird but it's been a bad experience for me".    Symptoms: shortness of breath, feeling shaky and like she could black out at times.   Having pain in left side of neck.  I just feel weird not like myself.    The anxiety is getting worse. Frequency: A few days ago but yesterday morning the anxiety was really bad. Pertinent Negatives: Patient denies chest pain Disposition: [] ED /[] Urgent Care (no appt availability in office) / [x] Appointment(In office/virtual)/ []  [facility]/ [] Home Care/ [] Refused Recommended Disposition /[] [facility]/ []  Follow-up with PCP Additional Notes: Appt made for today at 10:20 with Dr. Gala Jubilee.

## 2024-01-28 NOTE — Progress Notes (Signed)
 Date:  01/28/2024   Name:  Eileen Mills   DOB:  1951-01-25   MRN:  [id number]   Chief Complaint: Anxiety (Patient said she is having SOB, chest fullness, nervous regularly, patient said that each day it's getting worse) and Neck Pain  Dizziness This is a new problem. The current episode started yesterday (lasted 1 minute). The problem has been resolved. Associated symptoms include nausea and weakness. Pertinent negatives include no abdominal pain, arthralgias, chest pain, chills, coughing, diaphoresis, joint swelling or vomiting. Nothing aggravates the symptoms.  Depression        This is a chronic problem.  The onset quality is undetermined.   The problem occurs daily.  The problem has been gradually worsening since onset.     The symptoms are aggravated by nothing.  Past treatments include nothing.   Review of Systems  Constitutional:  Negative for chills and diaphoresis.  HENT:  Negative for trouble swallowing.   Respiratory:  Positive for shortness of breath. Negative for cough, chest tightness and wheezing.   Cardiovascular:  Negative for chest pain and palpitations.  Gastrointestinal:  Positive for nausea. Negative for abdominal pain and vomiting.  Musculoskeletal:  Negative for arthralgias, gait problem and joint swelling.  Neurological:  Positive for dizziness, tremors and weakness.  Psychiatric/Behavioral:  Positive for depression, dysphoric mood and sleep disturbance. The patient is nervous/anxious.      Lab Results  Component Value Date   NA 141 05/12/2023   K 4.5 05/12/2023   CO2 25 05/12/2023   GLUCOSE 96 05/12/2023   BUN 12 05/12/2023   CREATININE 0.98 05/12/2023   CALCIUM  9.7 05/12/2023   EGFR 62 05/12/2023   GFRNONAA >60 08/18/2020   Lab Results  Component Value Date   CHOL 184 05/12/2023   HDL 85 05/12/2023   LDLCALC 77 05/12/2023   TRIG 133 05/12/2023   CHOLHDL 2.2 05/12/2023   Lab Results  Component Value Date   TSH 2.850 03/21/2022    No results found for: "HGBA1C" Lab Results  Component Value Date   WBC 7.5 05/12/2023   HGB 12.9 05/12/2023   HCT 39.9 05/12/2023   MCV 89 05/12/2023   PLT 308 05/12/2023   Lab Results  Component Value Date   ALT 17 05/12/2023   AST 15 05/12/2023   ALKPHOS 91 05/12/2023   BILITOT 0.4 05/12/2023   Lab Results  Component Value Date   VD25OH 51.0 05/12/2023     Patient Active Problem List   Diagnosis Date Noted   Current moderate episode of major depressive disorder without prior episode (HCC) 01/28/2024   Situational anxiety 11/18/2023   Dysphagia 03/24/2023   Age-related osteoporosis without current pathological fracture 01/21/2021   Aortic atherosclerosis (HCC) 03/30/2020   Stage 2 moderate COPD by GOLD classification (HCC) 01/07/2017   Ganglion cyst of wrist 07/02/2015   Intermittent palpitations 07/02/2015   Degenerative arthritis of spine with cord compression 07/02/2015   Abdominal bloating 07/02/2015   Baker's cyst of knee 07/02/2015   Hyperlipidemia, mild 07/02/2015    Allergies  Allergen Reactions   Sulfa Antibiotics Other (See Comments)    It was a long time ago, cannot remember reaction.    Past Surgical History:  Procedure Laterality Date   ABDOMINAL HYSTERECTOMY     CESAREAN SECTION     COLONOSCOPY N/A 03/03/2015   Procedure: COLONOSCOPY;  Surgeon: Marnee Sink, MD;  Location: [facility];  Service: Gastroenterology;  Laterality: N/A;   ESOPHAGOGASTRODUODENOSCOPY (EGD) WITH PROPOFOL   N/A 06/17/2023   Procedure: ESOPHAGOGASTRODUODENOSCOPY (EGD) WITH PROPOFOL ;  Surgeon: Luke Salaam, MD;  Location: [facility];  Service: Gastroenterology;  Laterality: N/A;   POLYPECTOMY  03/03/2015   Procedure: POLYPECTOMY INTESTINAL;  Surgeon: Marnee Sink, MD;  Location: [facility];  Service: Gastroenterology;;   TUBAL LIGATION      Social History   Tobacco Use   Smoking status: Former    Current packs/day: 0.00    Average packs/day: 1.5 packs/day  for 15.0 years (22.5 ttl pk-yrs)    Types: Cigarettes    Start date: 22    Quit date: 2012    Years since quitting: 13.3   Smokeless tobacco: Never  Vaping Use   Vaping status: Never Used  Substance Use Topics   Alcohol use: No    Alcohol/week: 0.0 standard drinks of alcohol   Drug use: No     Medication list has been reviewed and updated.  Current Meds  Medication Sig   albuterol  (PROVENTIL  HFA;VENTOLIN  HFA) 108 (90 Base) MCG/ACT inhaler Inhale 2 puffs into the lungs every 6 (six) hours as needed for wheezing or shortness of breath.   Ascorbic Acid  (VITAMIN C) 1000 MG tablet Take 1,000 mg by mouth every other day.    atorvastatin  (LIPITOR) 10 MG tablet TAKE 1 TABLET BY MOUTH EVERY DAY   Cholecalciferol 1.25 MG (50000 UT) capsule Take 5,000 Units by mouth daily.   famotidine  (PEPCID ) 20 MG tablet Take 1 tablet (20 mg total) by mouth 2 (two) times daily.   LORazepam  (ATIVAN ) 0.5 MG tablet Take 1 tablet (0.5 mg total) by mouth at bedtime as needed for anxiety.   neomycin -polymyxin b-dexamethasone (MAXITROL) 3.5-10000-0.1 SUSP Place 2 drops into both eyes every 6 (six) hours.   sertraline (ZOLOFT) 50 MG tablet Take 1 tablet (50 mg total) by mouth daily.   TRELEGY ELLIPTA  100-62.5-25 MCG/INH AEPB Inhale 1 puff into the lungs daily.   triamcinolone cream (KENALOG) 0.1 % Apply topically 2 (two) times daily.   zinc gluconate 50 MG tablet Take 50 mg by mouth daily.       01/28/2024   10:22 AM 11/18/2023    3:42 PM 07/29/2023   10:26 AM 05/12/2023    9:54 AM  GAD 7 : Generalized Anxiety Score  Nervous, Anxious, on Edge 3 2 0 0  Control/stop worrying 3 2 0 0  Worry too much - different things 3 2 0 0  Trouble relaxing 2 2 0 0  Restless 3 2 0 0  Easily annoyed or irritable 2 0 0 0  Afraid - awful might happen 2 2 0 0  Total GAD 7 Score 18 12 0 0  Anxiety Difficulty Somewhat difficult Not difficult at all Not difficult at all Not difficult at all       01/28/2024   10:22 AM  11/18/2023    3:41 PM 07/29/2023   10:26 AM  Depression screen PHQ 2/9  Decreased Interest 2 0 0  Down, Depressed, Hopeless 3 2 0  PHQ - 2 Score 5 2 0  Altered sleeping 2 0 0  Tired, decreased energy 2 2 0  Change in appetite 2 0 0  Feeling bad or failure about yourself  2 0 0  Trouble concentrating 2 0 0  Moving slowly or fidgety/restless 2 2 0  Suicidal thoughts 0 0 0  PHQ-9 Score 17 6 0  Difficult doing work/chores Somewhat difficult Not difficult at all Not difficult at all    BP Readings from Last  3 Encounters:  01/28/24 126/78  11/18/23 124/78  07/29/23 120/78    Physical Exam Vitals and nursing note reviewed.  Constitutional:      General: She is not in acute distress.    Appearance: Normal appearance. She is well-developed.  HENT:     Head: Normocephalic and atraumatic.  Neck:     Vascular: No carotid bruit.  Cardiovascular:     Rate and Rhythm: Normal rate and regular rhythm.     Heart sounds: No murmur heard. Pulmonary:     Effort: Pulmonary effort is normal. No respiratory distress.     Breath sounds: No wheezing or rhonchi.  Abdominal:     General: Abdomen is flat.     Palpations: Abdomen is soft.     Tenderness: There is no abdominal tenderness.  Musculoskeletal:     Cervical back: Normal range of motion.     Right lower leg: No edema.     Left lower leg: No edema.  Lymphadenopathy:     Cervical: No cervical adenopathy.  Skin:    General: Skin is warm and dry.     Capillary Refill: Capillary refill takes less than 2 seconds.     Findings: No rash.  Neurological:     General: No focal deficit present.     Mental Status: She is alert and oriented to person, place, and time.  Psychiatric:        Attention and Perception: Attention normal.        Mood and Affect: Mood is anxious and depressed.        Speech: Speech normal.        Behavior: Behavior normal.        Cognition and Memory: Cognition normal.        Judgment: Judgment normal.     Wt  Readings from Last 3 Encounters:  01/28/24 162 lb 6 oz (73.7 kg)  11/18/23 166 lb (75.3 kg)  07/29/23 169 lb (76.7 kg)    BP 126/78   Pulse 71   Ht 5\' 3"  (1.6 m)   Wt 162 lb 6 oz (73.7 kg)   SpO2 95%   BMI 28.76 kg/m   Assessment and Plan:  Problem List Items Addressed This Visit       Unprioritized   Current moderate episode of major depressive disorder without prior episode (HCC)   Gradual onset of symptoms with apparent panic attack yesterday. Will continue Alprazolam at bedtime as needed Start Sertraline 50 mg daily Follow up one month.      Relevant Medications   sertraline (ZOLOFT) 50 MG tablet   Other Visit Diagnoses       Episodic lightheadedness    -  Primary   suspect this was a panic attack EKG - NSR @ 61; WNL if recurrent, will order Zio Monitor   Relevant Orders   EKG 12-Lead (Completed)       Return in about 1 month (around 02/27/2024) for Depression.    Sheron Dixons, MD [facility] Primary Care and Sports Medicine [place]

## 2024-01-28 NOTE — Assessment & Plan Note (Signed)
 Gradual onset of symptoms with apparent panic attack yesterday. Will continue Alprazolam at bedtime as needed Start Sertraline 50 mg daily Follow up one month.

## 2024-02-12 DIAGNOSIS — R911 Solitary pulmonary nodule: Secondary | ICD-10-CM | POA: Diagnosis not present

## 2024-02-12 DIAGNOSIS — Z87891 Personal history of nicotine dependence: Secondary | ICD-10-CM | POA: Diagnosis not present

## 2024-02-12 DIAGNOSIS — J449 Chronic obstructive pulmonary disease, unspecified: Secondary | ICD-10-CM | POA: Diagnosis not present

## 2024-02-19 ENCOUNTER — Other Ambulatory Visit: Payer: Self-pay | Admitting: Internal Medicine

## 2024-02-19 DIAGNOSIS — F321 Major depressive disorder, single episode, moderate: Secondary | ICD-10-CM

## 2024-02-20 NOTE — Telephone Encounter (Signed)
 Ordered 01/28/24 #30 1 RF (will have re-eval before quantity can be increased)  Requested Prescriptions  Refused Prescriptions Disp Refills   sertraline  (ZOLOFT ) 50 MG tablet [Pharmacy Med Name: SERTRALINE  HCL 50 MG TABLET] 90 tablet 1    Sig: TAKE 1 TABLET BY MOUTH EVERY DAY     Psychiatry:  Antidepressants - SSRI - sertraline  Passed - 02/20/2024  2:12 PM      Passed - AST in normal range and within 360 days    AST  Date Value Ref Range Status  05/12/2023 15 0 - 40 IU/L Final   SGOT(AST)  Date Value Ref Range Status  09/24/2012 23 15 - 37 Unit/L Final         Passed - ALT in normal range and within 360 days    ALT  Date Value Ref Range Status  05/12/2023 17 0 - 32 IU/L Final   SGPT (ALT)  Date Value Ref Range Status  09/24/2012 22 12 - 78 U/L Final         Passed - Completed PHQ-2 or PHQ-9 in the last 360 days      Passed - Valid encounter within last 6 months    Recent Outpatient Visits           3 weeks ago Episodic lightheadedness   [place] [facility] at [facility], Chales [place], MD   3 months ago Situational anxiety   [facility] at [facility], Chales Colorado, MD       Future Appointments             In 2 months Gala Jubilee, [place]ales [place], MD [facility] Primary [facility] & [facility] Medicine at [facility], [facility]

## 2024-02-27 ENCOUNTER — Encounter: Payer: Self-pay | Admitting: Internal Medicine

## 2024-02-27 ENCOUNTER — Ambulatory Visit (INDEPENDENT_AMBULATORY_CARE_PROVIDER_SITE_OTHER): Admitting: Internal Medicine

## 2024-02-27 VITALS — BP 110/58 | HR 72 | Ht 63.0 in | Wt 160.0 lb

## 2024-02-27 DIAGNOSIS — F321 Major depressive disorder, single episode, moderate: Secondary | ICD-10-CM | POA: Diagnosis not present

## 2024-02-27 DIAGNOSIS — J449 Chronic obstructive pulmonary disease, unspecified: Secondary | ICD-10-CM | POA: Diagnosis not present

## 2024-02-27 MED ORDER — SERTRALINE HCL 50 MG PO TABS
50.0000 mg | ORAL_TABLET | Freq: Every day | ORAL | 1 refills | Status: AC
Start: 2024-02-27 — End: ?

## 2024-02-27 NOTE — Progress Notes (Signed)
 Date:  02/27/2024   Name:  Eileen Mills   DOB:  04/19/1951   MRN:  [id number]   Chief Complaint: Depression (Patient presents today for a follow up on her depression. She has been taking Zoloft  for 1 month now and her symptoms have improved. She is feel good today. She dos not have any concerns for today's visit. )  Depression        This is a new problem.  The current episode started more than 1 month ago.   The onset quality is undetermined.   The problem has been gradually improving since onset.  Associated symptoms include no fatigue and no headaches.  Past treatments include SSRIs - Selective serotonin reuptake inhibitors and other medications.  Compliance with treatment is good.  Previous treatment provided significant relief.   Review of Systems  Constitutional:  Negative for chills, fatigue, fever and unexpected weight change.  Respiratory:  Negative for chest tightness and shortness of breath.   Cardiovascular:  Negative for chest pain.  Neurological:  Negative for dizziness and headaches.  Psychiatric/Behavioral:  Positive for depression and sleep disturbance. Negative for dysphoric mood. The patient is not nervous/anxious.      Lab Results  Component Value Date   NA 141 05/12/2023   K 4.5 05/12/2023   CO2 25 05/12/2023   GLUCOSE 96 05/12/2023   BUN 12 05/12/2023   CREATININE 0.98 05/12/2023   CALCIUM  9.7 05/12/2023   EGFR 62 05/12/2023   GFRNONAA >60 08/18/2020   Lab Results  Component Value Date   CHOL 184 05/12/2023   HDL 85 05/12/2023   LDLCALC 77 05/12/2023   TRIG 133 05/12/2023   CHOLHDL 2.2 05/12/2023   Lab Results  Component Value Date   TSH 2.850 03/21/2022   No results found for: "HGBA1C" Lab Results  Component Value Date   WBC 7.5 05/12/2023   HGB 12.9 05/12/2023   HCT 39.9 05/12/2023   MCV 89 05/12/2023   PLT 308 05/12/2023   Lab Results  Component Value Date   ALT 17 05/12/2023   AST 15 05/12/2023   ALKPHOS 91 05/12/2023    BILITOT 0.4 05/12/2023   Lab Results  Component Value Date   VD25OH 51.0 05/12/2023     Patient Active Problem List   Diagnosis Date Noted   Current moderate episode of major depressive disorder without prior episode (HCC) 01/28/2024   Situational anxiety 11/18/2023   Dysphagia 03/24/2023   Age-related osteoporosis without current pathological fracture 01/21/2021   Aortic atherosclerosis (HCC) 03/30/2020   Stage 2 moderate COPD by GOLD classification (HCC) 01/07/2017   Ganglion cyst of wrist 07/02/2015   Intermittent palpitations 07/02/2015   Degenerative arthritis of spine with cord compression 07/02/2015   Abdominal bloating 07/02/2015   Baker's cyst of knee 07/02/2015   Hyperlipidemia, mild 07/02/2015    Allergies  Allergen Reactions   Sulfa Antibiotics Other (See Comments)    It was a long time ago, cannot remember reaction.    Past Surgical History:  Procedure Laterality Date   ABDOMINAL HYSTERECTOMY     CESAREAN SECTION     COLONOSCOPY N/A 03/03/2015   Procedure: COLONOSCOPY;  Surgeon: Marnee Sink, MD;  Location: [facility];  Service: Gastroenterology;  Laterality: N/A;   ESOPHAGOGASTRODUODENOSCOPY (EGD) WITH PROPOFOL  N/A 06/17/2023   Procedure: ESOPHAGOGASTRODUODENOSCOPY (EGD) WITH PROPOFOL ;  Surgeon: Luke Salaam, MD;  Location[facility];  Service: Gastroenterology;  Laterality: N/A;   POLYPECTOMY  03/03/2015   Procedure: POLYPECTOMY INTESTINAL;  Surgeon: Marnee Sink, MD;  Location: [facility];  Service: Gastroenterology;;   TUBAL LIGATION      Social History   Tobacco Use   Smoking status: Former    Current packs/day: 0.00    Average packs/day: 1.5 packs/day for 15.0 years (22.5 ttl pk-yrs)    Types: Cigarettes    Start date: 5    Quit date: 2012    Years since quitting: 13.4   Smokeless tobacco: Never  Vaping Use   Vaping status: Never Used  Substance Use Topics   Alcohol use: No    Alcohol/week: 0.0 standard drinks of alcohol    Drug use: No     Medication list has been reviewed and updated.  Current Meds  Medication Sig   albuterol  (PROVENTIL  HFA;VENTOLIN  HFA) 108 (90 Base) MCG/ACT inhaler Inhale 2 puffs into the lungs every 6 (six) hours as needed for wheezing or shortness of breath.   Ascorbic Acid  (VITAMIN C) 1000 MG tablet Take 1,000 mg by mouth every other day.    atorvastatin  (LIPITOR) 10 MG tablet TAKE 1 TABLET BY MOUTH EVERY DAY   Cholecalciferol 1.25 MG (50000 UT) capsule Take 5,000 Units by mouth daily.   famotidine  (PEPCID ) 20 MG tablet Take 1 tablet (20 mg total) by mouth 2 (two) times daily.   LORazepam  (ATIVAN ) 0.5 MG tablet Take 1 tablet (0.5 mg total) by mouth at bedtime as needed for anxiety.   neomycin -polymyxin b-dexamethasone (MAXITROL) 3.5-10000-0.1 SUSP Place 2 drops into both eyes every 6 (six) hours.   TRELEGY ELLIPTA  100-62.5-25 MCG/INH AEPB Inhale 1 puff into the lungs daily.   triamcinolone cream (KENALOG) 0.1 % Apply topically 2 (two) times daily.   zinc gluconate 50 MG tablet Take 50 mg by mouth daily.   [DISCONTINUED] sertraline  (ZOLOFT ) 50 MG tablet Take 1 tablet (50 mg total) by mouth daily.       02/27/2024   11:00 AM 01/28/2024   10:22 AM 11/18/2023    3:42 PM 07/29/2023   10:26 AM  GAD 7 : Generalized Anxiety Score  Nervous, Anxious, on Edge 1 3 2  0  Control/stop worrying 1 3 2  0  Worry too much - different things 1 3 2  0  Trouble relaxing 1 2 2  0  Restless 0 3 2 0  Easily annoyed or irritable 0 2 0 0  Afraid - awful might happen 0 2 2 0  Total GAD 7 Score 4 18 12  0  Anxiety Difficulty Not difficult at all Somewhat difficult Not difficult at all Not difficult at all       02/27/2024   10:59 AM 01/28/2024   10:22 AM 11/18/2023    3:41 PM  Depression screen PHQ 2/9  Decreased Interest 0 2 0  Down, Depressed, Hopeless 0 3 2  PHQ - 2 Score 0 5 2  Altered sleeping 1 2 0  Tired, decreased energy 0 2 2  Change in appetite 0 2 0  Feeling bad or failure about yourself   0 2 0  Trouble concentrating 0 2 0  Moving slowly or fidgety/restless 0 2 2  Suicidal thoughts 0 0 0  PHQ-9 Score 1 17 6   Difficult doing work/chores Not difficult at all Somewhat difficult Not difficult at all    BP Readings from Last 3 Encounters:  02/27/24 (!) 110/58  01/28/24 126/78  11/18/23 124/78    Physical Exam Vitals and nursing note reviewed.  Constitutional:      General: She is not in acute  distress.    Appearance: Normal appearance. She is well-developed.  HENT:     Head: Normocephalic and atraumatic.  Cardiovascular:     Rate and Rhythm: Normal rate and regular rhythm.     Heart sounds: No murmur heard. Pulmonary:     Effort: Pulmonary effort is normal. No respiratory distress.     Breath sounds: No wheezing or rhonchi.  Musculoskeletal:     Cervical back: Normal range of motion.     Right lower leg: No edema.     Left lower leg: No edema.  Skin:    General: Skin is warm and dry.     Findings: No rash.  Neurological:     Mental Status: She is alert and oriented to person, place, and time.  Psychiatric:        Attention and Perception: Attention and perception normal.        Mood and Affect: Mood normal.        Behavior: Behavior normal.        Thought Content: Thought content normal.     Wt Readings from Last 3 Encounters:  02/27/24 160 lb (72.6 kg)  01/28/24 162 lb 6 oz (73.7 kg)  11/18/23 166 lb (75.3 kg)    BP (!) 110/58   Pulse 72   Ht 5\' 3"  (1.6 m)   Wt 160 lb (72.6 kg)   SpO2 94%   BMI 28.34 kg/m   Assessment and Plan:  Problem List Items Addressed This Visit       Unprioritized   Stage 2 moderate COPD by GOLD classification (HCC) (Chronic)   Stable, controlled on prn albuterol       Current moderate episode of major depressive disorder without prior episode (HCC) - Primary   Clinically improved on Sertraline  started last visit.   No SI or HI on evaluation. Plan to continue same medications for now.       Relevant  Medications   sertraline  (ZOLOFT ) 50 MG tablet    No follow-ups on file.    Sheron Dixons, MD [facility] [place]

## 2024-02-27 NOTE — Assessment & Plan Note (Signed)
 Stable, controlled on prn albuterol 

## 2024-02-27 NOTE — Assessment & Plan Note (Addendum)
 Clinically improved on Sertraline  started last visit.   No SI or HI on evaluation. Plan to continue same medications for at least 6 months then evaluate for dose reduction or discontinuation. Continue Ativan  prn at bedtime for thunderstorms, etc

## 2024-03-07 ENCOUNTER — Other Ambulatory Visit: Payer: Self-pay | Admitting: Internal Medicine

## 2024-03-07 DIAGNOSIS — R1319 Other dysphagia: Secondary | ICD-10-CM

## 2024-03-09 NOTE — Telephone Encounter (Signed)
 Requested by interface surescripts. Future visit in 2 months. No refills remain.  Requested Prescriptions  Pending Prescriptions Disp Refills   famotidine  (PEPCID ) 20 MG tablet [Pharmacy Med Name: FAMOTIDINE  20 MG TABLET] 180 tablet 0    Sig: TAKE 1 TABLET BY MOUTH TWICE A DAY     Gastroenterology:  H2 Antagonists Passed - 03/09/2024 10:10 AM      Passed - Valid encounter within last 12 months    Recent Outpatient Visits           1 week ago Current moderate episode of major depressive disorder without prior episode [facility])   [place] [facility] at [facility], Chales [place], MD   1 month ago Episodic lightheadedness   [facility] Medicine at [facility], Chales [place], MD   3 months ago Situational anxiety   [facility] & [facility] Medicine at [facility], Chales [place], MD       Future Appointments             In 2 months Gala Jubilee, Chales [place], MD [facility] at [place], [facility]

## 2024-04-14 ENCOUNTER — Ambulatory Visit (INDEPENDENT_AMBULATORY_CARE_PROVIDER_SITE_OTHER): Admitting: Emergency Medicine

## 2024-04-14 VITALS — Ht 63.0 in | Wt 157.0 lb

## 2024-04-14 DIAGNOSIS — Z0000 Encounter for general adult medical examination without abnormal findings: Secondary | ICD-10-CM | POA: Diagnosis not present

## 2024-04-14 DIAGNOSIS — Z1231 Encounter for screening mammogram for malignant neoplasm of breast: Secondary | ICD-10-CM

## 2024-04-14 NOTE — Patient Instructions (Signed)
 Eileen Mills , Thank you for taking time out of your busy schedule to complete your Annual Wellness Visit with me. I enjoyed our conversation and look forward to speaking with you again next year. I, as well as your care team,  appreciate your ongoing commitment to your health goals. Please review the following plan we discussed and let me know if I can assist you in the future. Your Game plan/ To Do List    Referrals: None   Follow up Visits: Next Medicare AWV with our clinical staff: 04/20/25 @ 4:00pm (PHONE VISIT)   Have you seen your provider in the last 6 months (3 months if uncontrolled diabetes)? Yes Next Office Visit with your provider: 05/13/24 @ 9:20am with Dr. Justus  Clinician Recommendations:  Aim for 30 minutes of exercise or brisk walking, 6-8 glasses of water , and 5 servings of fruits and vegetables each day. Check with your pharmacy to see if you still need to get the 2nd shingles vaccine. Please call to schedule your mammogram (due 08/05/24):  [facility] Address: [address], [place], [place] Phone: [phone]  [facility] at [facility] [address] [place], [place] [address] Phone: [phone]       This is a list of the screening recommended for you and due dates:  Health Maintenance  Topic Date Due   Zoster (Shingles) Vaccine (1 of 2) 07/08/2003   Flu Shot  04/30/2024   Mammogram  08/04/2024   Screening for Lung Cancer  08/31/2024   Colon Cancer Screening  03/02/2025   Medicare Annual Wellness Visit  04/14/2025   DTaP/Tdap/Td vaccine (2 - Td or Tdap) 01/04/2026   DEXA scan (bone density measurement)  08/04/2028   Pneumococcal Vaccine for age over 42  Completed   Hepatitis C Screening  Completed   Hepatitis B Vaccine  Aged Out   HPV Vaccine  Aged Out   Meningitis B Vaccine  Aged Out   COVID-19 Vaccine  Discontinued    Advanced directives: (Copy Requested) Please bring a copy of  your health care power of attorney and living will to the office to be added to your chart at your convenience. You can mail to [facility] [address] [place], [place] [address] or email to [email or web address] Advance Care Planning is important because it:  [x]  Makes sure you receive the medical care that is consistent with your values, goals, and preferences  [x]  It provides guidance to your family and loved ones and reduces their decisional burden about whether or not they are making the right decisions based on your wishes.  Follow the link provided in your after visit summary or read over the paperwork we have mailed to you to help you started getting your Advance Directives in place. If you need assistance in completing these, please reach out to us  so that we can help you!  See attachments for Preventive Care and Fall Prevention Tips.   Fall Prevention in the Home, Adult Falls can cause injuries and affect people of all ages. There are many simple things that you can do to make your home safe and to help prevent falls. If you need it, ask for help making these changes. What actions can I take to prevent falls? General information Use good lighting in all rooms. Make sure to: Replace any light bulbs that burn out. Turn on lights if it is dark and use night-lights. Keep items that  you use often in easy-to-reach places. Lower the shelves around your home if needed. Move furniture so that there are clear paths around it. Do not keep throw rugs or other things on the floor that can make you trip. If any of your floors are uneven, fix them. Add color or contrast paint or tape to clearly mark and help you see: Grab bars or handrails. First and last steps of staircases. Where the edge of each step is. If you use a ladder or stepladder: Make sure that it is fully opened. Do not climb a closed ladder. Make sure the sides of the ladder are locked in place. Have  someone hold the ladder while you use it. Know where your pets are as you move through your home. What can I do in the bathroom?     Keep the floor dry. Clean up any water  that is on the floor right away. Remove soap buildup in the bathtub or shower. Buildup makes bathtubs and showers slippery. Use non-skid mats or decals on the floor of the bathtub or shower. Attach bath mats securely with double-sided, non-slip rug tape. If you need to sit down while you are in the shower, use a non-slip stool. Install grab bars by the toilet and in the bathtub and shower. Do not use towel bars as grab bars. What can I do in the bedroom? Make sure that you have a light by your bed that is easy to reach. Do not use any sheets or blankets on your bed that hang to the floor. Have a firm bench or chair with side arms that you can use for support when you get dressed. What can I do in the kitchen? Clean up any spills right away. If you need to reach something above you, use a sturdy step stool that has a grab bar. Keep electrical cables out of the way. Do not use floor polish or wax that makes floors slippery. What can I do with my stairs? Do not leave anything on the stairs. Make sure that you have a light switch at the top and the bottom of the stairs. Have them installed if you do not have them. Make sure that there are handrails on both sides of the stairs. Fix handrails that are broken or loose. Make sure that handrails are as long as the staircases. Install non-slip stair treads on all stairs in your home if they do not have carpet. Avoid having throw rugs at the top or bottom of stairs, or secure the rugs with carpet tape to prevent them from moving. Choose a carpet design that does not hide the edge of steps on the stairs. Make sure that carpet is firmly attached to the stairs. Fix any carpet that is loose or worn. What can I do on the outside of my home? Use bright outdoor lighting. Repair the  edges of walkways and driveways and fix any cracks. Clear paths of anything that can make you trip, such as tools or rocks. Add color or contrast paint or tape to clearly mark and help you see high doorway thresholds. Trim any bushes or trees on the main path into your home. Check that handrails are securely fastened and in good repair. Both sides of all steps should have handrails. Install guardrails along the edges of any raised decks or porches. Have leaves, snow, and ice cleared regularly. Use sand, salt, or ice melt on walkways during winter months if you live where there is ice and  snow. In the garage, clean up any spills right away, including grease or oil spills. What other actions can I take? Review your medicines with your health care provider. Some medicines can make you confused or feel dizzy. This can increase your chance of falling. Wear closed-toe shoes that fit well and support your feet. Wear shoes that have rubber soles and low heels. Use a cane, walker, scooter, or crutches that help you move around if needed. Talk with your provider about other ways that you can decrease your risk of falls. This may include seeing a physical therapist to learn to do exercises to improve movement and strength. Where to find more information Centers for Disease Control and Prevention, [employer]: TonerPromos.no [employer] on Aging: BaseRingTones.pl National Institute on Aging: BaseRingTones.pl Contact a health care provider if: You are afraid of falling at home. You feel weak, drowsy, or dizzy at home. You fall at home. Get help right away if you: Lose consciousness or have trouble moving after a fall. Have a fall that causes a head injury. These symptoms may be an emergency. Get help right away. Call 911. Do not wait to see if the symptoms will go away. Do not drive yourself to the hospital. This information is not intended to replace advice given to you by your health care provider. Make sure you  discuss any questions you have with your health care provider. Document Revised: 05/20/2022 Document Reviewed: 05/20/2022 Elsevier Patient Education  2024 [employer].

## 2024-04-14 NOTE — Progress Notes (Signed)
 Subjective:   Eileen Mills is a 73 y.o. who presents for a Medicare Wellness preventive visit.  As a reminder, Annual Wellness Visits don't include a physical exam, and some assessments may be limited, especially if this visit is performed virtually. We may recommend an in-person follow-up visit with your provider if needed.  Visit Complete: Virtual I connected with  Eileen Mills on 04/14/24 by a audio enabled telemedicine application and verified that I am speaking with the correct person using two identifiers.  Patient Location: Home  Provider Location: Home Office  I discussed the limitations of evaluation and management by telemedicine. The patient expressed understanding and agreed to proceed.  Vital Signs: Because this visit was a virtual/telehealth visit, some criteria may be missing or patient reported. Any vitals not documented were not able to be obtained and vitals that have been documented are patient reported.  VideoDeclined- This patient declined Librarian, academic. Therefore the visit was completed with audio only.  Persons Participating in Visit: Patient.  AWV Questionnaire: No: Patient Medicare AWV questionnaire was not completed prior to this visit.  Cardiac Risk Factors include: advanced age (>37men, >16 women);dyslipidemia     Objective:    Today's Vitals   04/14/24 1556  Weight: 157 lb (71.2 kg)  Height: 5' 3 (1.6 m)   Body mass index is 27.81 kg/m.     04/14/2024    4:06 PM 06/17/2023   10:48 AM 03/24/2023    2:25 PM 08/17/2020   11:04 AM 08/17/2020    7:00 AM 07/26/2020    9:42 AM 07/19/2019   10:16 AM  Advanced Directives  Does Patient Have a Medical Advance Directive? Yes Yes Yes No No Yes Yes  Type of Estate agent of [place];Living will Healthcare Power of [place];Living will Living will   Healthcare Power of [place];Living will Healthcare Power of [place];Living will   Does patient want to make changes to medical advance directive? No - Patient declined        Copy of Healthcare Power of Attorney in Chart? No - copy requested     No - copy requested No - copy requested  Would patient like information on creating a medical advance directive?    No - Patient declined No - Patient declined      Current Medications (verified) Outpatient Encounter Medications as of 04/14/2024  Medication Sig   albuterol  (PROVENTIL  HFA;VENTOLIN  HFA) 108 (90 Base) MCG/ACT inhaler Inhale 2 puffs into the lungs every 6 (six) hours as needed for wheezing or shortness of breath.   Ascorbic Acid  (VITAMIN C) 1000 MG tablet Take 1,000 mg by mouth every other day.    atorvastatin  (LIPITOR) 10 MG tablet TAKE 1 TABLET BY MOUTH EVERY DAY   Cholecalciferol 1.25 MG (50000 UT) capsule Take 5,000 Units by mouth daily.   famotidine  (PEPCID ) 20 MG tablet TAKE 1 TABLET BY MOUTH TWICE A DAY   LORazepam  (ATIVAN ) 0.5 MG tablet Take 1 tablet (0.5 mg total) by mouth at bedtime as needed for anxiety.   neomycin -polymyxin b-dexamethasone (MAXITROL) 3.5-10000-0.1 SUSP Place 2 drops into both eyes every 6 (six) hours. (Patient taking differently: Place 2 drops into both eyes every 6 (six) hours. PRN)   sertraline  (ZOLOFT ) 50 MG tablet Take 1 tablet (50 mg total) by mouth daily.   TRELEGY ELLIPTA  100-62.5-25 MCG/INH AEPB Inhale 1 puff into the lungs daily.   zinc gluconate 50 MG tablet Take 50 mg by mouth daily.   omeprazole  (  PRILOSEC  OTC) 20 MG tablet Take 2 tablets (40 mg total) by mouth daily. (Patient not taking: Reported on 04/14/2024)   triamcinolone cream (KENALOG) 0.1 % Apply topically 2 (two) times daily. (Patient not taking: Reported on 04/14/2024)   No facility-administered encounter medications on file as of 04/14/2024.    Allergies (verified) Sulfa antibiotics   History: Past Medical History:  Diagnosis Date   Acute respiratory failure with hypoxia (HCC) 08/17/2020   Arthritis    Cataract     COPD (chronic obstructive pulmonary disease) (HCC)    Emphysema of lung (HCC)    Hyperlipidemia    Past Surgical History:  Procedure Laterality Date   ABDOMINAL HYSTERECTOMY     CESAREAN SECTION     COLONOSCOPY N/A 03/03/2015   Procedure: COLONOSCOPY;  Surgeon: Rogelia Copping, MD;  Location: [facility];  Service: Gastroenterology;  Laterality: N/A;   ESOPHAGOGASTRODUODENOSCOPY (EGD) WITH PROPOFOL  N/A 06/17/2023   Procedure: ESOPHAGOGASTRODUODENOSCOPY (EGD) WITH PROPOFOL ;  Surgeon: Therisa Bi, MD;  Location: [facility];  Service: Gastroenterology;  Laterality: N/A;   POLYPECTOMY  03/03/2015   Procedure: POLYPECTOMY INTESTINAL;  Surgeon: Rogelia Copping, MD;  Location: [facility];  Service: Gastroenterology;;   TUBAL LIGATION     Family History  Problem Relation Age of Onset   Alzheimer's disease Mother    CAD Father    Breast cancer Neg Hx    Social History   Socioeconomic History   Marital status: Widowed    Spouse name: Not on file   Number of children: 4   Years of education: Not on file   Highest education level: 12th grade  Occupational History   Not on file  Tobacco Use   Smoking status: Former    Current packs/day: 0.00    Average packs/day: 1.5 packs/day for 15.0 years (22.5 ttl pk-yrs)    Types: Cigarettes    Start date: 76    Quit date: 2012    Years since quitting: 13.5   Smokeless tobacco: Never  Vaping Use   Vaping status: Never Used  Substance and Sexual Activity   Alcohol use: No    Alcohol/week: 0.0 standard drinks of alcohol   Drug use: No   Sexual activity: Not Currently  Other Topics Concern   Not on file  Social History Narrative   Not on file   Social Drivers of Health   Financial Resource Strain: Low Risk  (04/14/2024)   Overall Financial Resource Strain (CARDIA)    Difficulty of Paying Living Expenses: Not hard at all  Food Insecurity: No Food Insecurity (04/14/2024)   Hunger Vital Sign    Worried About Running Out of  Food in the Last Year: Never true    Ran Out of Food in the Last Year: Never true  Transportation Needs: No Transportation Needs (04/14/2024)   PRAPARE - Administrator, Civil Service (Medical): No    Lack of Transportation (Non-Medical): No  Physical Activity: Insufficiently Active (04/14/2024)   Exercise Vital Sign    Days of Exercise per Week: 3 days    Minutes of Exercise per Session: 30 min  Stress: No Stress Concern Present (04/14/2024)   [employer] of Occupational Health - Occupational Stress Questionnaire    Feeling of Stress: Not at all  Social Connections: Socially Isolated (04/14/2024)   Social Connection and Isolation Panel    Frequency of Communication with Friends and Family: More than three times a week    Frequency of Social Gatherings with Friends and  Family: Three times a week    Attends Religious Services: Never    Active Member of Clubs or Organizations: No    Attends Banker Meetings: Never    Marital Status: Widowed    Tobacco Counseling Counseling given: Not Answered    Clinical Intake:  Pre-visit preparation completed: Yes  Pain : No/denies pain     BMI - recorded: 27.81 Nutritional Status: BMI 25 -29 Overweight Nutritional Risks: None Diabetes: No  No results found for: HGBA1C   How often do you need to have someone help you when you read instructions, pamphlets, or other written materials from your doctor or pharmacy?: 1 - Never  Interpreter Needed?: No  Information entered by :: Vina Ned, CMA   Activities of Daily Living     04/14/2024    3:58 PM  In your present state of health, do you have any difficulty performing the following activities:  Hearing? 0  Vision? 0  Difficulty concentrating or making decisions? 0  Walking or climbing stairs? 0  Dressing or bathing? 0  Doing errands, shopping? 0  Preparing Food and eating ? N  Using the Toilet? N  In the past six months, have you accidently leaked  urine? N  Do you have problems with loss of bowel control? N  Managing your Medications? N  Managing your Finances? N  Housekeeping or managing your Housekeeping? N    Patient Care Team: Justus Leita DEL, MD as PCP - General (Internal Medicine) Theotis Lavelle BRAVO, MD as Referring Physician (Pulmonary Disease) Lucio Franky PARAS, OD (Optometry) Therisa Bi, MD as Consulting Physician (Gastroenterology) Pa, [facility] (Dermatology)  I have updated your Care Teams any recent Medical Services you may have received from other providers in the past year.     Assessment:   This is a routine wellness examination for [place].  Hearing/Vision screen Hearing Screening - Comments:: Denies hearing loss  Vision Screening - Comments:: Gets routine eye exams, Dr. Franky Lucio, [place]   Goals Addressed             This Visit's Progress    COMPLETED: Increase physical activity       Recommend increasing physical activity to at least 3 days per week     Patient Stated       Eat healthier       Depression Screen     04/14/2024    4:04 PM 02/27/2024   10:59 AM 01/28/2024   10:22 AM 11/18/2023    3:41 PM 07/29/2023   10:26 AM 05/12/2023    9:54 AM 04/21/2023    1:18 PM  PHQ 2/9 Scores  PHQ - 2 Score 1 0 5 2 0 0 0  PHQ- 9 Score 1 1 17 6  0 0 0    Fall Risk     04/14/2024    4:07 PM 02/27/2024   10:59 AM 01/28/2024   10:21 AM 11/18/2023    3:41 PM 07/29/2023   10:26 AM  Fall Risk   Falls in the past year? 0 0 0 0 0  Number falls in past yr: 0 0 0 0 0  Injury with Fall? 0 0 0 0 0  Risk for fall due to : No Fall Risks No Fall Risks No Fall Risks No Fall Risks No Fall Risks  Follow up Falls evaluation completed Falls evaluation completed Falls evaluation completed Falls evaluation completed Falls evaluation completed    MEDICARE RISK AT HOME:  Medicare Risk at  Home Any stairs in or around the home?: Yes If so, are there any without handrails?: No Home free of loose  throw rugs in walkways, pet beds, electrical cords, etc?: Yes Adequate lighting in your home to reduce risk of falls?: Yes Life alert?: No Use of a cane, walker or w/c?: No Grab bars in the bathroom?: No Shower chair or bench in shower?: No Elevated toilet seat or a handicapped toilet?: No  TIMED UP AND GO:  Was the test performed?  No  Cognitive Function: 6CIT completed        04/14/2024    4:08 PM 03/24/2023    2:26 PM 07/26/2020    9:44 AM 07/19/2019   10:20 AM  6CIT Screen  What Year? 0 points 0 points 0 points 0 points  What month? 0 points 0 points 0 points 0 points  What time? 0 points 0 points 0 points 0 points  Count back from 20 0 points 0 points 0 points 0 points  Months in reverse 0 points 0 points 0 points 0 points  Repeat phrase 0 points 0 points 0 points 0 points  Total Score 0 points 0 points 0 points 0 points    Immunizations Immunization History  Administered Date(s) Administered   Fluad Trivalent(High Dose 65+) 07/09/2023   Influenza Inj Mdck Quad Pf 06/23/2018   Influenza, High Dose Seasonal PF 07/17/2019, 07/16/2020   Influenza,inj,Quad PF,6+ Mos 07/07/2015, 07/10/2017   Influenza-Unspecified 06/30/2016, 07/17/2019, 06/21/2021, 07/10/2022   PFIZER(Purple Top)SARS-COV-2 Vaccination 04/14/2020, 05/15/2020   PNEUMOCOCCAL CONJUGATE-20 05/12/2023   Pneumococcal Conjugate-13 01/18/2019   Pneumococcal Polysaccharide-23 01/07/2017, 03/21/2021   Tdap 01/05/2016   Zoster, Live 12/22/2013   Zoster, Unspecified 05/27/2022    Screening Tests Health Maintenance  Topic Date Due   Zoster Vaccines- Shingrix  (1 of 2) 07/08/2003   INFLUENZA VACCINE  04/30/2024   MAMMOGRAM  08/04/2024   Lung Cancer Screening  08/31/2024   Colonoscopy  03/02/2025   Medicare Annual Wellness (AWV)  04/14/2025   DTaP/Tdap/Td (2 - Td or Tdap) 01/04/2026   DEXA SCAN  08/04/2028   Pneumococcal Vaccine: 50+ Years  Completed   Hepatitis C Screening  Completed   Hepatitis B Vaccines   Aged Out   HPV VACCINES  Aged Out   Meningococcal B Vaccine  Aged Out   COVID-19 Vaccine  Discontinued    Health Maintenance  Health Maintenance Due  Topic Date Due   Zoster Vaccines- Shingrix  (1 of 2) 07/08/2003   Health Maintenance Items Addressed: Mammogram ordered, See Nurse Notes at the end of this note  Additional Screening:  Vision Screening: Recommended annual ophthalmology exams for early detection of glaucoma and other disorders of the eye. Would you like a referral to an eye doctor? No    Dental Screening: Recommended annual dental exams for proper oral hygiene  Community Resource Referral / Chronic Care Management: CRR required this visit?  No   CCM required this visit?  No   Plan:    I have personally reviewed and noted the following in the patient's chart:   Medical and social history Use of alcohol, tobacco or illicit drugs  Current medications and supplements including opioid prescriptions. Patient is not currently taking opioid prescriptions. Functional ability and status Nutritional status Physical activity Advanced directives List of other physicians Hospitalizations, surgeries, and ER visits in previous 12 months Vitals Screenings to include cognitive, depression, and falls Referrals and appointments  In addition, I have reviewed and discussed with patient certain preventive protocols, quality  metrics, and best practice recommendations. A written personalized care plan for preventive services as well as general preventive health recommendations were provided to patient.   Vina Ned, CMA   04/14/2024   After Visit Summary: (MyChart) Due to this being a telephonic visit, the after visit summary with patients personalized plan was offered to patient via MyChart   Notes:  Placed order for MMG (due ~08/04/24) May need 2nd Shingrix  vaccine. Patient to check with pharmacy

## 2024-05-13 ENCOUNTER — Ambulatory Visit (INDEPENDENT_AMBULATORY_CARE_PROVIDER_SITE_OTHER): Payer: Self-pay | Admitting: Internal Medicine

## 2024-05-13 ENCOUNTER — Encounter: Payer: Self-pay | Admitting: Internal Medicine

## 2024-05-13 VITALS — BP 112/60 | HR 62 | Ht 63.0 in | Wt 153.8 lb

## 2024-05-13 DIAGNOSIS — Z0000 Encounter for general adult medical examination without abnormal findings: Secondary | ICD-10-CM | POA: Diagnosis not present

## 2024-05-13 DIAGNOSIS — J449 Chronic obstructive pulmonary disease, unspecified: Secondary | ICD-10-CM | POA: Diagnosis not present

## 2024-05-13 DIAGNOSIS — E785 Hyperlipidemia, unspecified: Secondary | ICD-10-CM | POA: Diagnosis not present

## 2024-05-13 DIAGNOSIS — Z1231 Encounter for screening mammogram for malignant neoplasm of breast: Secondary | ICD-10-CM | POA: Diagnosis not present

## 2024-05-13 DIAGNOSIS — M810 Age-related osteoporosis without current pathological fracture: Secondary | ICD-10-CM | POA: Diagnosis not present

## 2024-05-13 DIAGNOSIS — M792 Neuralgia and neuritis, unspecified: Secondary | ICD-10-CM | POA: Diagnosis not present

## 2024-05-13 DIAGNOSIS — R1319 Other dysphagia: Secondary | ICD-10-CM | POA: Diagnosis not present

## 2024-05-13 DIAGNOSIS — F321 Major depressive disorder, single episode, moderate: Secondary | ICD-10-CM | POA: Diagnosis not present

## 2024-05-13 MED ORDER — FAMOTIDINE 20 MG PO TABS: 20.0000 mg | ORAL_TABLET | Freq: Two times a day (BID) | ORAL | 3 refills | Status: AC

## 2024-05-13 MED ORDER — ATORVASTATIN CALCIUM 10 MG PO TABS: 10.0000 mg | ORAL_TABLET | Freq: Every day | ORAL | 3 refills | Status: AC

## 2024-05-13 NOTE — Assessment & Plan Note (Addendum)
 DEXA 08/2023 - osteopenia Continue calcium  and vitamin D 

## 2024-05-13 NOTE — Assessment & Plan Note (Signed)
 Reflux symptoms are controlled on pepcid  bid.. Patient denies red flag symptoms - no melena, weight loss, dysphagia.

## 2024-05-13 NOTE — Patient Instructions (Signed)
 Call [facility] to schedule your mammogram at [phone].

## 2024-05-13 NOTE — Assessment & Plan Note (Signed)
 Clinically stable on Sertraline .   No SI or HI on evaluation. She uses ativan  intermittently for anxiety/panic symptoms related to weather, etc Plan to continue same medications for now.

## 2024-05-13 NOTE — Progress Notes (Signed)
 Date:  05/13/2024   Name:  Eileen Mills   DOB:  15-Jan-1951   MRN:  [id number]   Chief Complaint: Annual Exam Eileen Mills is a 73 y.o. female who presents today for her Complete Annual Exam. She feels well. She reports exercising / walking. She reports she is sleeping poorly. Breast complaints - left breast pain.  Health Maintenance  Topic Date Due   Zoster (Shingles) Vaccine (1 of 2) 07/08/2003   Flu Shot  04/30/2024   Mammogram  08/04/2024   Screening for Lung Cancer  08/31/2024   Colon Cancer Screening  03/02/2025   Medicare Annual Wellness Visit  04/14/2025   DTaP/Tdap/Td vaccine (2 - Td or Tdap) 01/04/2026   DEXA scan (bone density measurement)  08/04/2028   Pneumococcal Vaccine for age over 42  Completed   Hepatitis C Screening  Completed   HPV Vaccine  Aged Out   Meningitis B Vaccine  Aged Out   COVID-19 Vaccine  Discontinued    Hyperlipidemia This is a chronic problem. The problem is controlled. Pertinent negatives include no chest pain, myalgias or shortness of breath. Current antihyperlipidemic treatment includes statins. The current treatment provides significant improvement of lipids.  Depression        This is a chronic problem.The problem is unchanged.  Associated symptoms include no fatigue, no myalgias and no headaches.  Past treatments include SSRIs - Selective serotonin reuptake inhibitors. Gastroesophageal Reflux She complains of heartburn. She reports no abdominal pain, no chest pain, no coughing or no wheezing. This is a recurrent problem. The problem occurs occasionally. Pertinent negatives include no fatigue. She has tried a histamine-2 antagonist for the symptoms.    Review of Systems  Constitutional:  Negative for fatigue and unexpected weight change.  HENT:  Negative for trouble swallowing.   Eyes:  Negative for visual disturbance.  Respiratory:  Negative for cough, chest tightness, shortness of breath and wheezing.    Cardiovascular:  Negative for chest pain, palpitations and leg swelling.  Gastrointestinal:  Positive for heartburn. Negative for abdominal pain, constipation and diarrhea.  Genitourinary:  Negative for dysuria and urgency.       Intermittent left breast discomfort  Musculoskeletal:  Negative for arthralgias and myalgias.  Neurological:  Negative for dizziness, weakness, light-headedness and headaches.  Psychiatric/Behavioral:  Positive for depression, dysphoric mood and sleep disturbance. The patient is nervous/anxious.      Lab Results  Component Value Date   NA 141 05/12/2023   K 4.5 05/12/2023   CO2 25 05/12/2023   GLUCOSE 96 05/12/2023   BUN 12 05/12/2023   CREATININE 0.98 05/12/2023   CALCIUM  9.7 05/12/2023   EGFR 62 05/12/2023   GFRNONAA >60 08/18/2020   Lab Results  Component Value Date   CHOL 184 05/12/2023   HDL 85 05/12/2023   LDLCALC 77 05/12/2023   TRIG 133 05/12/2023   CHOLHDL 2.2 05/12/2023   Lab Results  Component Value Date   TSH 2.850 03/21/2022   No results found for: HGBA1C Lab Results  Component Value Date   WBC 7.5 05/12/2023   HGB 12.9 05/12/2023   HCT 39.9 05/12/2023   MCV 89 05/12/2023   PLT 308 05/12/2023   Lab Results  Component Value Date   ALT 17 05/12/2023   AST 15 05/12/2023   ALKPHOS 91 05/12/2023   BILITOT 0.4 05/12/2023   Lab Results  Component Value Date   VD25OH 51.0 05/12/2023     Patient Active Problem List  Diagnosis Date Noted   Current moderate episode of major depressive disorder without prior episode (HCC) 01/28/2024   Situational anxiety 11/18/2023   Dysphagia 03/24/2023   Age-related osteoporosis without current pathological fracture 01/21/2021   Aortic atherosclerosis (HCC) 03/30/2020   Stage 2 moderate COPD by GOLD classification (HCC) 01/07/2017   Ganglion cyst of wrist 07/02/2015   Intermittent palpitations 07/02/2015   Degenerative arthritis of spine with cord compression 07/02/2015   Abdominal  bloating 07/02/2015   Baker's cyst of knee 07/02/2015   Hyperlipidemia, mild 07/02/2015    Allergies  Allergen Reactions   Sulfa Antibiotics Other (See Comments)    It was a long time ago, cannot remember reaction.    Past Surgical History:  Procedure Laterality Date   ABDOMINAL HYSTERECTOMY     CESAREAN SECTION     COLONOSCOPY N/A 03/03/2015   Procedure: COLONOSCOPY;  Surgeon: Rogelia Copping, MD;  Location: [facility];  Service: Gastroenterology;  Laterality: N/A;   ESOPHAGOGASTRODUODENOSCOPY (EGD) WITH PROPOFOL  N/A 06/17/2023   Procedure: ESOPHAGOGASTRODUODENOSCOPY (EGD) WITH PROPOFOL ;  Surgeon: Therisa Bi, MD;  Location[facility];  Service: Gastroenterology;  Laterality: N/A;   POLYPECTOMY  03/03/2015   Procedure: POLYPECTOMY INTESTINAL;  Surgeon: Rogelia Copping, MD;  Location: [facility];  Service: Gastroenterology;;   TUBAL LIGATION      Social History   Tobacco Use   Smoking status: Former    Current packs/day: 0.00    Average packs/day: 1.5 packs/day for 15.0 years (22.5 ttl pk-yrs)    Types: Cigarettes    Start date: 78    Quit date: 2012    Years since quitting: 13.6   Smokeless tobacco: Never  Vaping Use   Vaping status: Never Used  Substance Use Topics   Alcohol use: No   Drug use: No     Medication list has been reviewed and updated.  Current Meds  Medication Sig   albuterol  (PROVENTIL  HFA;VENTOLIN  HFA) 108 (90 Base) MCG/ACT inhaler Inhale 2 puffs into the lungs every 6 (six) hours as needed for wheezing or shortness of breath.   Ascorbic Acid  (VITAMIN C) 1000 MG tablet Take 1,000 mg by mouth every other day.    Cholecalciferol 1.25 MG (50000 UT) capsule Take 5,000 Units by mouth daily.   LORazepam  (ATIVAN ) 0.5 MG tablet Take 1 tablet (0.5 mg total) by mouth at bedtime as needed for anxiety.   sertraline  (ZOLOFT ) 50 MG tablet Take 1 tablet (50 mg total) by mouth daily.   TRELEGY ELLIPTA  100-62.5-25 MCG/INH AEPB Inhale 1 puff into the  lungs daily.   zinc gluconate 50 MG tablet Take 50 mg by mouth daily.   [DISCONTINUED] atorvastatin  (LIPITOR) 10 MG tablet TAKE 1 TABLET BY MOUTH EVERY DAY   [DISCONTINUED] famotidine  (PEPCID ) 20 MG tablet TAKE 1 TABLET BY MOUTH TWICE A DAY       05/13/2024    9:04 AM 05/13/2024    8:58 AM 02/27/2024   11:00 AM 01/28/2024   10:22 AM  GAD 7 : Generalized Anxiety Score  Nervous, Anxious, on Edge 1 0 1 3  Control/stop worrying 1 0 1 3  Worry too much - different things 1 0 1 3  Trouble relaxing 1 0 1 2  Restless 1 0 0 3  Easily annoyed or irritable 0 0 0 2  Afraid - awful might happen 1 0 0 2  Total GAD 7 Score 6 0 4 18  Anxiety Difficulty Not difficult at all Not difficult at all Not difficult at  all Somewhat difficult       05/13/2024    9:04 AM 05/13/2024    8:57 AM 04/14/2024    4:04 PM  Depression screen PHQ 2/9  Decreased Interest 1 0 0  Down, Depressed, Hopeless 1 0 1  PHQ - 2 Score 2 0 1  Altered sleeping 2 0 0  Tired, decreased energy 1 0 0  Change in appetite 0 0 0  Feeling bad or failure about yourself  0 0 0  Trouble concentrating 0 0 0  Moving slowly or fidgety/restless 1 0 0  Suicidal thoughts 0 0 0  PHQ-9 Score 6 0 1  Difficult doing work/chores Not difficult at all Not difficult at all Not difficult at all    BP Readings from Last 3 Encounters:  05/13/24 112/60  02/27/24 (!) 110/58  01/28/24 126/78    Physical Exam Vitals and nursing note reviewed.  Constitutional:      General: She is not in acute distress.    Appearance: She is well-developed.  HENT:     Head: Normocephalic and atraumatic.     Right Ear: Tympanic membrane and ear canal normal.     Left Ear: Tympanic membrane and ear canal normal.     Nose:     Right Sinus: No maxillary sinus tenderness.     Left Sinus: No maxillary sinus tenderness.  Eyes:     General: No scleral icterus.       Right eye: No discharge.        Left eye: No discharge.     Conjunctiva/sclera: Conjunctivae normal.   Neck:     Thyroid : No thyromegaly.     Vascular: No carotid bruit.  Cardiovascular:     Rate and Rhythm: Normal rate and regular rhythm.     Pulses: Normal pulses.     Heart sounds: Normal heart sounds.  Pulmonary:     Effort: Pulmonary effort is normal. No respiratory distress.     Breath sounds: No wheezing or rhonchi.  Chest:  Breasts:    Right: Normal.     Left: Normal.     Comments: No left breast mass, tenderness, skin change or nipple discharge Abdominal:     General: Bowel sounds are normal.     Palpations: Abdomen is soft.     Tenderness: There is no abdominal tenderness.  Musculoskeletal:     Cervical back: Normal range of motion. No erythema.     Right lower leg: No edema.     Left lower leg: No edema.  Lymphadenopathy:     Cervical: No cervical adenopathy.  Skin:    General: Skin is warm and dry.     Capillary Refill: Capillary refill takes less than 2 seconds.     Findings: No rash.  Neurological:     Mental Status: She is alert and oriented to person, place, and time.     Cranial Nerves: No cranial nerve deficit.     Sensory: No sensory deficit.     Deep Tendon Reflexes: Reflexes are normal and symmetric.  Psychiatric:        Attention and Perception: Attention normal.        Mood and Affect: Mood normal.     Wt Readings from Last 3 Encounters:  05/13/24 153 lb 12.8 oz (69.8 kg)  04/14/24 157 lb (71.2 kg)  02/27/24 160 lb (72.6 kg)    BP 112/60   Pulse 62   Ht 5' 3 (1.6 m)   Wt 153  lb 12.8 oz (69.8 kg)   SpO2 98%   BMI 27.24 kg/m   Assessment and Plan:  Problem List Items Addressed This Visit       Unprioritized   Hyperlipidemia, mild (Chronic)   Mild hyperlipidemia managed with diet only. 10 yr risk is low.      Relevant Medications   atorvastatin  (LIPITOR) 10 MG tablet   Other Relevant Orders   Comprehensive metabolic panel with GFR   Lipid panel   Stage 2 moderate COPD by GOLD classification (HCC) (Chronic)   Stable mild  symptoms without recent flares or infection. Continues on albuterol  PRN Followed by Pulmonary Dr. Theotis      Relevant Orders   CBC with Differential/Platelet   Age-related osteoporosis without current pathological fracture (Chronic)   DEXA 08/2023 - osteopenia Continue calcium  and vitamin D       Dysphagia   Reflux symptoms are controlled on pepcid  bid. Patient denies red flag symptoms - no melena, weight loss, dysphagia.       Relevant Medications   famotidine  (PEPCID ) 20 MG tablet   Current moderate episode of major depressive disorder without prior episode (HCC) (Chronic)   Clinically stable on Sertraline .   No SI or HI on evaluation. She uses ativan  intermittently for anxiety/panic symptoms related to weather, etc Plan to continue same medications for now.       Relevant Orders   TSH   Other Visit Diagnoses       Annual physical exam    -  Primary   continue healthy diet up to date on screenings recommend Shingrix  vaccine; annual flu     Encounter for screening mammogram for breast cancer       schedule at [facility]     Neuropathic pain       likely pinched nerve in upper back radiating to left arm and left breast use Tylenol  twice a day x 7 d and heat follow up if no improvement       Return in about 6 months (around 11/13/2024) for TOC - depression,COPD.    Leita HILARIO Adie, MD [facility] [place]

## 2024-05-13 NOTE — Assessment & Plan Note (Signed)
 Mild hyperlipidemia managed with diet only. 10 yr risk is low.

## 2024-05-13 NOTE — Assessment & Plan Note (Addendum)
 Stable mild symptoms without recent flares or infection. Continues on albuterol  PRN Followed by Pulmonary Dr. Theotis

## 2024-05-14 ENCOUNTER — Ambulatory Visit: Payer: Self-pay | Admitting: Internal Medicine

## 2024-05-14 LAB — CBC WITH DIFFERENTIAL/PLATELET
Basophils Absolute: 0.1 x10E3/uL (ref 0.0–0.2)
Basos: 1 %
EOS (ABSOLUTE): 0.1 x10E3/uL (ref 0.0–0.4)
Eos: 1 %
Hematocrit: 39 % (ref 34.0–46.6)
Hemoglobin: 12.9 g/dL (ref 11.1–15.9)
Immature Grans (Abs): 0 x10E3/uL (ref 0.0–0.1)
Immature Granulocytes: 0 %
Lymphocytes Absolute: 2 x10E3/uL (ref 0.7–3.1)
Lymphs: 24 %
MCH: 30.7 pg (ref 26.6–33.0)
MCHC: 33.1 g/dL (ref 31.5–35.7)
MCV: 93 fL (ref 79–97)
Monocytes Absolute: 0.5 x10E3/uL (ref 0.1–0.9)
Monocytes: 6 %
Neutrophils Absolute: 5.8 x10E3/uL (ref 1.4–7.0)
Neutrophils: 68 %
Platelets: 265 x10E3/uL (ref 150–450)
RBC: 4.2 x10E6/uL (ref 3.77–5.28)
RDW: 12.9 % (ref 11.7–15.4)
WBC: 8.5 x10E3/uL (ref 3.4–10.8)

## 2024-05-14 LAB — COMPREHENSIVE METABOLIC PANEL WITH GFR
ALT: 17 IU/L (ref 0–32)
AST: 20 IU/L (ref 0–40)
Albumin: 4.5 g/dL (ref 3.9–4.9)
Alkaline Phosphatase: 116 IU/L (ref 44–121)
BUN/Creatinine Ratio: 15 (ref 12–28)
BUN: 14 mg/dL (ref 8–27)
Bilirubin Total: 0.5 mg/dL (ref 0.0–1.2)
CO2: 23 mmol/L (ref 20–29)
Calcium: 9.7 mg/dL (ref 8.7–10.3)
Chloride: 104 mmol/L (ref 96–106)
Creatinine, Ser: 0.95 mg/dL (ref 0.57–1.00)
Globulin, Total: 2.2 g/dL (ref 1.5–4.5)
Glucose: 102 mg/dL — ABNORMAL HIGH (ref 70–99)
Potassium: 4.5 mmol/L (ref 3.5–5.2)
Sodium: 142 mmol/L (ref 134–144)
Total Protein: 6.7 g/dL (ref 6.0–8.5)
eGFR: 64 mL/min/1.73 (ref >59)

## 2024-05-14 LAB — LIPID PANEL
Chol/HDL Ratio: 2.6 ratio (ref 0.0–4.4)
Cholesterol, Total: 176 mg/dL (ref 100–199)
HDL: 67 mg/dL (ref >39)
LDL Chol Calc (NIH): 89 mg/dL (ref 0–99)
Triglycerides: 114 mg/dL (ref 0–149)
VLDL Cholesterol Cal: 20 mg/dL (ref 5–40)

## 2024-05-14 LAB — TSH: TSH: 1.81 u[IU]/mL (ref 0.450–4.500)

## 2024-06-04 ENCOUNTER — Other Ambulatory Visit: Payer: Self-pay | Admitting: Internal Medicine

## 2024-06-04 DIAGNOSIS — F418 Other specified anxiety disorders: Secondary | ICD-10-CM

## 2024-06-04 NOTE — Telephone Encounter (Signed)
 Please review.  KP

## 2024-06-04 NOTE — Telephone Encounter (Signed)
 Copied from CRM [id number]. Topic: Clinical - Medication Refill >> Jun 04, 2024  2:57 PM Antwanette L wrote: Medication: LORazepam  (ATIVAN ) 0.5 MG tablet   Has the patient contacted their pharmacy? Yes  This is the patient's preferred pharmacy:  [id number] - [place], [place] - [address] [place] [address] Phone: [phone] Fax: [phone]  Is this the correct pharmacy for this prescription? Yes   Has the prescription been filled recently? Yes. Last refill was on 11/28/23  Is the patient out of the medication? No. Patient has 4-5 pills left  Has the patient been seen for an appointment in the last year OR does the patient have an upcoming appointment? Yes. Last ov with Dr. Justus was on 05/13/24  Can we respond through MyChart? Yes and by phone at [phone]  Agent: Please be advised that Rx refills may take up to 3 business days. We ask that you follow-up with your pharmacy.

## 2024-06-04 NOTE — Telephone Encounter (Signed)
 Requested medications are due for refill today.  yes  Requested medications are on the active medications list.  yes  Last refill. 11/18/2023 #30 1 rf  Future visit scheduled.   Yes - next year  Notes to clinic.  Refill not delegated.    Requested Prescriptions  Pending Prescriptions Disp Refills   LORazepam  (ATIVAN ) 0.5 MG tablet [Pharmacy Med Name: LORAZEPAM  0.5 MG TABLET] 30 tablet     Sig: TAKE 1 TABLET BY MOUTH AT BEDTIME AS NEEDED FOR ANXIETY.     Not Delegated - Psychiatry: Anxiolytics/Hypnotics 2 Failed - 06/04/2024  3:23 PM      Failed - This refill cannot be delegated      Failed - Urine Drug Screen completed in last 360 days      Passed - Patient is not pregnant      Passed - Valid encounter within last 6 months    Recent Outpatient Visits           3 weeks ago Annual physical exam   [place] [facility] at [facility], Leita DEL, MD   3 months ago Current moderate episode of major depressive disorder without prior episode [facility])   [place] [facility] at [facility], Leita DEL, MD   4 months ago Episodic lightheadedness   [facility] Primary Care [facility] Medicine at [facility], Leita DEL, MD   6 months ago Situational anxiety   [facility] at [facility], Leita DEL, MD

## 2024-07-19 DIAGNOSIS — C4442 Squamous cell carcinoma of skin of scalp and neck: Secondary | ICD-10-CM | POA: Diagnosis not present

## 2024-07-19 DIAGNOSIS — R208 Other disturbances of skin sensation: Secondary | ICD-10-CM | POA: Diagnosis not present

## 2024-07-19 DIAGNOSIS — D485 Neoplasm of uncertain behavior of skin: Secondary | ICD-10-CM | POA: Diagnosis not present

## 2024-08-05 ENCOUNTER — Ambulatory Visit
Admission: RE | Admit: 2024-08-05 | Discharge: 2024-08-05 | Disposition: A | Discharge status: Home / Self Care | Source: Ambulatory Visit | Attending: Internal Medicine | Admitting: Internal Medicine

## 2024-08-05 DIAGNOSIS — Z1231 Encounter for screening mammogram for malignant neoplasm of breast: Secondary | ICD-10-CM | POA: Diagnosis not present

## 2024-08-11 DIAGNOSIS — J449 Chronic obstructive pulmonary disease, unspecified: Secondary | ICD-10-CM | POA: Diagnosis not present

## 2024-08-13 DIAGNOSIS — C4442 Squamous cell carcinoma of skin of scalp and neck: Secondary | ICD-10-CM | POA: Diagnosis not present

## 2024-08-13 DIAGNOSIS — D044 Carcinoma in situ of skin of scalp and neck: Secondary | ICD-10-CM | POA: Diagnosis not present

## 2024-09-03 ENCOUNTER — Other Ambulatory Visit: Payer: Self-pay | Admitting: Internal Medicine

## 2024-09-03 DIAGNOSIS — F418 Other specified anxiety disorders: Secondary | ICD-10-CM

## 2024-09-06 NOTE — Telephone Encounter (Signed)
 Requested medications are due for refill today.  yes  Requested medications are on the active medications list.  yes  Last refill. 06/04/2024 #30 0 rf  Future visit scheduled.   yes  Notes to clinic.  Refill not delegated.    Requested Prescriptions  Pending Prescriptions Disp Refills   LORazepam  (ATIVAN ) 0.5 MG tablet [Pharmacy Med Name: LORAZEPAM  0.5 MG TABLET] 30 tablet 0    Sig: TAKE 1 TABLET BY MOUTH AT BEDTIME AS NEEDED FOR ANXIETY.     Not Delegated - Psychiatry: Anxiolytics/Hypnotics 2 Failed - 09/06/2024  4:38 PM      Failed - This refill cannot be delegated      Failed - Urine Drug Screen completed in last 360 days      Passed - Patient is not pregnant      Passed - Valid encounter within last 6 months    Recent Outpatient Visits           3 months ago Annual physical exam   [place] [facility] at [facility], Leita DEL, MD   6 months ago Current moderate episode of major depressive disorder without prior episode [facility])   [place] [facility] at [facility], Leita DEL, MD   7 months ago Episodic lightheadedness   [facility] Primary Care & Sports Medicine at [facility], Leita DEL, MD   9 months ago Situational anxiety   [facility] Primary Care & [facility] Medicine at [facility], Leita DEL, MD

## 2024-09-07 ENCOUNTER — Telehealth: Payer: Self-pay

## 2024-09-07 ENCOUNTER — Other Ambulatory Visit: Payer: Self-pay | Admitting: Internal Medicine

## 2024-09-07 NOTE — Telephone Encounter (Signed)
 Copied from CRM #[id number]. Topic: Clinical - Medication Question >> Sep 07, 2024  9:11 AM Olam RAMAN wrote: Reason for CRM: pt is calling for the status on  LORAZEPAM  0.5 MG TABLET] stated she called the pharmacy and shows pending. Pt is out of medication Cb [phone] (M)

## 2024-09-07 NOTE — Telephone Encounter (Signed)
 Please review.  KP

## 2024-09-07 NOTE — Progress Notes (Unsigned)
 Date:  09/07/2024   Name:  Tiearra Colwell   DOB:  [birth year]   MRN:  [id number]   Chief Complaint: No chief complaint on file.  HPI  Review of Systems   Lab Results  Component Value Date   NA 142 05/13/2024   K 4.5 05/13/2024   CO2 23 05/13/2024   GLUCOSE 102 (H) 05/13/2024   BUN 14 05/13/2024   CREATININE 0.95 05/13/2024   CALCIUM  9.7 05/13/2024   EGFR 64 05/13/2024   GFRNONAA >60 08/18/2020   Lab Results  Component Value Date   CHOL 176 05/13/2024   HDL 67 05/13/2024   LDLCALC 89 05/13/2024   TRIG 114 05/13/2024   CHOLHDL 2.6 05/13/2024   Lab Results  Component Value Date   TSH 1.810 05/13/2024   No results found for: HGBA1C Lab Results  Component Value Date   WBC 8.5 05/13/2024   HGB 12.9 05/13/2024   HCT 39.0 05/13/2024   MCV 93 05/13/2024   PLT 265 05/13/2024   Lab Results  Component Value Date   ALT 17 05/13/2024   AST 20 05/13/2024   ALKPHOS 116 05/13/2024   BILITOT 0.5 05/13/2024   Lab Results  Component Value Date   VD25OH 51.0 05/12/2023     Patient Active Problem List   Diagnosis Date Noted   Current moderate episode of major depressive disorder without prior episode (HCC) 01/28/2024   Situational anxiety 11/18/2023   Dysphagia 03/24/2023   Age-related osteoporosis without current pathological fracture 01/21/2021   Aortic atherosclerosis 03/30/2020   Stage 2 moderate COPD by GOLD classification (HCC) 01/07/2017   Ganglion cyst of wrist 07/02/2015   Intermittent palpitations 07/02/2015   Degenerative arthritis of spine with cord compression 07/02/2015   Abdominal bloating 07/02/2015   Baker's cyst of knee 07/02/2015   Hyperlipidemia, mild 07/02/2015    Allergies  Allergen Reactions   Sulfa Antibiotics Other (See Comments)    It was a long time ago, cannot remember reaction.    Past Surgical History:  Procedure Laterality Date   ABDOMINAL HYSTERECTOMY     CESAREAN SECTION     COLONOSCOPY N/A 03/03/2015    Procedure: COLONOSCOPY;  Surgeon: Rogelia Copping, MD;  Location: [facility];  Service: Gastroenterology;  Laterality: N/A;   ESOPHAGOGASTRODUODENOSCOPY (EGD) WITH PROPOFOL  N/A 06/17/2023   Procedure: ESOPHAGOGASTRODUODENOSCOPY (EGD) WITH PROPOFOL ;  Surgeon: Therisa Bi, MD;  Location: [facility] ENDOSCOPY;  Service: Gastroenterology;  Laterality: N/A;   POLYPECTOMY  03/03/2015   Procedure: POLYPECTOMY INTESTINAL;  Surgeon: Rogelia Copping, MD;  Location: [facility];  Service: Gastroenterology;;   TUBAL LIGATION      Social History   Tobacco Use   Smoking status: Former    Current packs/day: 0.00    Average packs/day: 1.5 packs/day for 15.0 years (22.5 ttl pk-yrs)    Types: Cigarettes    Start date: 62    Quit date: 2012    Years since quitting: 13.9   Smokeless tobacco: Never  Vaping Use   Vaping status: Never Used  Substance Use Topics   Alcohol use: No   Drug use: No     Medication list has been reviewed and updated.  No outpatient medications have been marked as taking for the 09/07/24 encounter (Orders Only) with Justus Leita DEL, MD.       05/13/2024    9:04 AM 05/13/2024    8:58 AM 02/27/2024   11:00 AM 01/28/2024   10:22 AM  GAD 7 : Generalized Anxiety Score  Nervous, Anxious, on Edge 1 0 1 3  Control/stop worrying 1 0 1 3  Worry too much - different things 1 0 1 3  Trouble relaxing 1 0 1 2  Restless 1 0 0 3  Easily annoyed or irritable 0 0 0 2  Afraid - awful might happen 1 0 0 2  Total GAD 7 Score 6 0 4 18  Anxiety Difficulty Not difficult at all Not difficult at all Not difficult at all Somewhat difficult       05/13/2024    9:04 AM 05/13/2024    8:57 AM 04/14/2024    4:04 PM  Depression screen PHQ 2/9  Decreased Interest 1 0 0  Down, Depressed, Hopeless 1 0 1  PHQ - 2 Score 2 0 1  Altered sleeping 2 0 0  Tired, decreased energy 1 0 0  Change in appetite 0 0 0  Feeling bad or failure about yourself  0 0 0  Trouble concentrating 0 0 0  Moving  slowly or fidgety/restless 1 0 0  Suicidal thoughts 0 0 0  PHQ-9 Score 6  0  1   Difficult doing work/chores Not difficult at all Not difficult at all Not difficult at all     Data saved with a previous flowsheet row definition    BP Readings from Last 3 Encounters:  05/13/24 112/60  02/27/24 (!) 110/58  01/28/24 126/78    Physical Exam  Wt Readings from Last 3 Encounters:  05/13/24 153 lb 12.8 oz (69.8 kg)  04/14/24 157 lb (71.2 kg)  02/27/24 160 lb (72.6 kg)    There were no vitals taken for this visit.  Assessment and Plan:  Problem List Items Addressed This Visit   None   No follow-ups on file.    Leita HILARIO Adie, MD [facility] [place]

## 2024-09-10 ENCOUNTER — Other Ambulatory Visit: Payer: Self-pay | Admitting: Internal Medicine

## 2024-09-10 DIAGNOSIS — F321 Major depressive disorder, single episode, moderate: Secondary | ICD-10-CM

## 2024-09-13 NOTE — Telephone Encounter (Signed)
 Requested Prescriptions  Pending Prescriptions Disp Refills   sertraline  (ZOLOFT ) 50 MG tablet [Pharmacy Med Name: SERTRALINE  HCL 50 MG TABLET] 90 tablet 0    Sig: TAKE 1 TABLET BY MOUTH EVERY DAY     Psychiatry:  Antidepressants - SSRI - sertraline  Passed - 09/13/2024 12:36 PM      Passed - AST in normal range and within 360 days    AST  Date Value Ref Range Status  05/13/2024 20 0 - 40 IU/L Final   SGOT(AST)  Date Value Ref Range Status  09/24/2012 23 15 - 37 Unit/L Final         Passed - ALT in normal range and within 360 days    ALT  Date Value Ref Range Status  05/13/2024 17 0 - 32 IU/L Final   SGPT (ALT)  Date Value Ref Range Status  09/24/2012 22 12 - 78 U/L Final         Passed - Completed PHQ-2 or PHQ-9 in the last 360 days      Passed - Valid encounter within last 6 months    Recent Outpatient Visits           4 months ago Annual physical exam   [place] [facility] at [facility], Leita DEL, MD   6 months ago Current moderate episode of major depressive disorder without prior episode [facility])   [place] [facility] at [facility], Leita DEL, MD   7 months ago Episodic lightheadedness   [facility] at [facility], Leita DEL, MD   10 months ago Situational anxiety   [facility] at [facility], Leita DEL, MD

## 2024-11-11 ENCOUNTER — Ambulatory Visit: Admitting: Student

## 2025-04-20 ENCOUNTER — Ambulatory Visit
